# Patient Record
Sex: Female | Born: 1955 | Race: Black or African American | Hispanic: No | Marital: Single | Smoking: Never smoker
Health system: Southern US, Community
[De-identification: ages and names within clinical notes are randomized; demographics above are authoritative.]

## PROBLEM LIST (undated history)

## (undated) DIAGNOSIS — I639 Cerebral infarction, unspecified: Secondary | ICD-10-CM

## (undated) DIAGNOSIS — K219 Gastro-esophageal reflux disease without esophagitis: Secondary | ICD-10-CM

## (undated) DIAGNOSIS — K5900 Constipation, unspecified: Secondary | ICD-10-CM

## (undated) DIAGNOSIS — E049 Nontoxic goiter, unspecified: Secondary | ICD-10-CM

## (undated) DIAGNOSIS — J45909 Unspecified asthma, uncomplicated: Secondary | ICD-10-CM

## (undated) DIAGNOSIS — F419 Anxiety disorder, unspecified: Secondary | ICD-10-CM

## (undated) DIAGNOSIS — I10 Essential (primary) hypertension: Secondary | ICD-10-CM

## (undated) DIAGNOSIS — M1990 Unspecified osteoarthritis, unspecified site: Secondary | ICD-10-CM

## (undated) DIAGNOSIS — F329 Major depressive disorder, single episode, unspecified: Secondary | ICD-10-CM

## (undated) DIAGNOSIS — J189 Pneumonia, unspecified organism: Secondary | ICD-10-CM

## (undated) DIAGNOSIS — R32 Unspecified urinary incontinence: Secondary | ICD-10-CM

## (undated) DIAGNOSIS — F32A Depression, unspecified: Secondary | ICD-10-CM

## (undated) HISTORY — PX: ABDOMINAL HYSTERECTOMY: SHX81

## (undated) HISTORY — PX: COLONOSCOPY: SHX5424

## (undated) HISTORY — PX: JOINT REPLACEMENT: SHX530

## (undated) HISTORY — PX: TOTAL HIP ARTHROPLASTY: SHX124

## (undated) HISTORY — PX: TUBAL LIGATION: SHX77

## (undated) HISTORY — PX: THYROIDECTOMY, PARTIAL: SHX18

## (undated) HISTORY — PX: OTHER SURGICAL HISTORY: SHX169

## (undated) HISTORY — PX: DILATION AND CURETTAGE OF UTERUS: SHX78

## (undated) HISTORY — PX: TONSILLECTOMY: SUR1361

---

## 1998-06-28 ENCOUNTER — Encounter: Admission: RE | Admit: 1998-06-28 | Discharge: 1998-06-28 | Payer: Self-pay | Admitting: *Deleted

## 1999-11-01 ENCOUNTER — Emergency Department (HOSPITAL_COMMUNITY): Admission: EM | Admit: 1999-11-01 | Discharge: 1999-11-01 | Payer: Self-pay | Admitting: Emergency Medicine

## 1999-11-07 ENCOUNTER — Encounter: Admission: RE | Admit: 1999-11-07 | Discharge: 1999-11-07 | Payer: Self-pay | Admitting: Hematology and Oncology

## 1999-11-22 ENCOUNTER — Ambulatory Visit (HOSPITAL_COMMUNITY): Admission: RE | Admit: 1999-11-22 | Discharge: 1999-11-22 | Payer: Self-pay

## 1999-12-28 ENCOUNTER — Encounter: Admission: RE | Admit: 1999-12-28 | Discharge: 1999-12-28 | Payer: Self-pay | Admitting: Internal Medicine

## 2001-06-23 ENCOUNTER — Emergency Department (HOSPITAL_COMMUNITY): Admission: EM | Admit: 2001-06-23 | Discharge: 2001-06-23 | Payer: Self-pay

## 2001-08-03 ENCOUNTER — Encounter: Admission: RE | Admit: 2001-08-03 | Discharge: 2001-08-03 | Payer: Self-pay | Admitting: Internal Medicine

## 2001-08-07 ENCOUNTER — Ambulatory Visit (HOSPITAL_COMMUNITY): Admission: RE | Admit: 2001-08-07 | Discharge: 2001-08-07 | Payer: Self-pay | Admitting: Internal Medicine

## 2002-04-03 ENCOUNTER — Emergency Department (HOSPITAL_COMMUNITY): Admission: EM | Admit: 2002-04-03 | Discharge: 2002-04-03 | Payer: Self-pay | Admitting: Emergency Medicine

## 2003-09-20 ENCOUNTER — Emergency Department (HOSPITAL_COMMUNITY): Admission: EM | Admit: 2003-09-20 | Discharge: 2003-09-20 | Payer: Self-pay | Admitting: Emergency Medicine

## 2003-10-01 DIAGNOSIS — I639 Cerebral infarction, unspecified: Secondary | ICD-10-CM

## 2003-10-01 HISTORY — DX: Cerebral infarction, unspecified: I63.9

## 2004-04-06 ENCOUNTER — Encounter: Admission: RE | Admit: 2004-04-06 | Discharge: 2004-04-06 | Payer: Self-pay | Admitting: Internal Medicine

## 2004-07-25 ENCOUNTER — Inpatient Hospital Stay (HOSPITAL_COMMUNITY): Admission: EM | Admit: 2004-07-25 | Discharge: 2004-07-30 | Payer: Self-pay | Admitting: Emergency Medicine

## 2004-07-26 ENCOUNTER — Encounter (INDEPENDENT_AMBULATORY_CARE_PROVIDER_SITE_OTHER): Payer: Self-pay | Admitting: *Deleted

## 2004-09-27 ENCOUNTER — Ambulatory Visit (HOSPITAL_COMMUNITY): Admission: RE | Admit: 2004-09-27 | Discharge: 2004-09-27 | Payer: Self-pay | Admitting: Gastroenterology

## 2004-10-09 ENCOUNTER — Other Ambulatory Visit: Admission: RE | Admit: 2004-10-09 | Discharge: 2004-10-09 | Payer: Self-pay | Admitting: Obstetrics and Gynecology

## 2004-10-17 ENCOUNTER — Encounter: Admission: RE | Admit: 2004-10-17 | Discharge: 2004-10-17 | Payer: Self-pay | Admitting: Obstetrics and Gynecology

## 2006-10-05 ENCOUNTER — Emergency Department (HOSPITAL_COMMUNITY): Admission: EM | Admit: 2006-10-05 | Discharge: 2006-10-05 | Payer: Self-pay | Admitting: Emergency Medicine

## 2008-12-02 ENCOUNTER — Inpatient Hospital Stay (HOSPITAL_COMMUNITY): Admission: EM | Admit: 2008-12-02 | Discharge: 2008-12-04 | Payer: Self-pay | Admitting: Emergency Medicine

## 2008-12-16 ENCOUNTER — Encounter (INDEPENDENT_AMBULATORY_CARE_PROVIDER_SITE_OTHER): Payer: Self-pay | Admitting: Internal Medicine

## 2008-12-16 ENCOUNTER — Other Ambulatory Visit: Admission: RE | Admit: 2008-12-16 | Discharge: 2008-12-16 | Payer: Self-pay | Admitting: Obstetrics and Gynecology

## 2009-09-13 ENCOUNTER — Encounter: Admission: RE | Admit: 2009-09-13 | Discharge: 2009-09-13 | Payer: Self-pay | Admitting: Internal Medicine

## 2010-10-22 ENCOUNTER — Encounter: Payer: Self-pay | Admitting: Obstetrics and Gynecology

## 2011-01-10 LAB — URINALYSIS, ROUTINE W REFLEX MICROSCOPIC
Bilirubin Urine: NEGATIVE
Glucose, UA: NEGATIVE mg/dL
Ketones, ur: NEGATIVE mg/dL
Nitrite: POSITIVE — AB
Protein, ur: 30 mg/dL — AB
Specific Gravity, Urine: 1.012 (ref 1.005–1.030)
Urobilinogen, UA: 2 mg/dL — ABNORMAL HIGH (ref 0.0–1.0)
pH: 6 (ref 5.0–8.0)

## 2011-01-10 LAB — URINE MICROSCOPIC-ADD ON

## 2011-01-10 LAB — URINE CULTURE
Colony Count: NO GROWTH
Culture: NO GROWTH

## 2011-01-10 LAB — GLUCOSE, CAPILLARY: Glucose-Capillary: 148 mg/dL — ABNORMAL HIGH (ref 70–99)

## 2011-01-10 LAB — POCT I-STAT, CHEM 8
BUN: 22 mg/dL (ref 6–23)
Calcium, Ion: 1.06 mmol/L — ABNORMAL LOW (ref 1.12–1.32)
Chloride: 95 mEq/L — ABNORMAL LOW (ref 96–112)
Creatinine, Ser: 2 mg/dL — ABNORMAL HIGH (ref 0.4–1.2)
Glucose, Bld: 107 mg/dL — ABNORMAL HIGH (ref 70–99)
HCT: 37 % (ref 36.0–46.0)
Hemoglobin: 12.6 g/dL (ref 12.0–15.0)
Potassium: 2.4 mEq/L — CL (ref 3.5–5.1)
Sodium: 135 mEq/L (ref 135–145)
TCO2: 33 mmol/L (ref 0–100)

## 2011-01-10 LAB — CBC
HCT: 31.9 % — ABNORMAL LOW (ref 36.0–46.0)
HCT: 35.2 % — ABNORMAL LOW (ref 36.0–46.0)
Hemoglobin: 11.4 g/dL — ABNORMAL LOW (ref 12.0–15.0)
Hemoglobin: 12.7 g/dL (ref 12.0–15.0)
MCHC: 35.8 g/dL (ref 30.0–36.0)
MCHC: 36 g/dL (ref 30.0–36.0)
MCV: 87 fL (ref 78.0–100.0)
MCV: 87.8 fL (ref 78.0–100.0)
Platelets: 196 10*3/uL (ref 150–400)
Platelets: 237 10*3/uL (ref 150–400)
RBC: 3.63 MIL/uL — ABNORMAL LOW (ref 3.87–5.11)
RBC: 4.05 MIL/uL (ref 3.87–5.11)
RDW: 13.7 % (ref 11.5–15.5)
RDW: 14 % (ref 11.5–15.5)
WBC: 14.3 10*3/uL — ABNORMAL HIGH (ref 4.0–10.5)
WBC: 8.9 10*3/uL (ref 4.0–10.5)

## 2011-01-10 LAB — COMPREHENSIVE METABOLIC PANEL
ALT: 19 U/L (ref 0–35)
AST: 16 U/L (ref 0–37)
Albumin: 2.7 g/dL — ABNORMAL LOW (ref 3.5–5.2)
Alkaline Phosphatase: 66 U/L (ref 39–117)
BUN: 15 mg/dL (ref 6–23)
CO2: 29 mEq/L (ref 19–32)
Calcium: 7.7 mg/dL — ABNORMAL LOW (ref 8.4–10.5)
Chloride: 104 mEq/L (ref 96–112)
Creatinine, Ser: 1.14 mg/dL (ref 0.4–1.2)
GFR calc Af Amer: >60 mL/min (ref >60)
GFR calc non Af Amer: 50 mL/min — ABNORMAL LOW (ref >60)
Glucose, Bld: 116 mg/dL — ABNORMAL HIGH (ref 70–99)
Potassium: 2.8 mEq/L — ABNORMAL LOW (ref 3.5–5.1)
Sodium: 139 mEq/L (ref 135–145)
Total Bilirubin: 1.4 mg/dL — ABNORMAL HIGH (ref 0.3–1.2)
Total Protein: 5.9 g/dL — ABNORMAL LOW (ref 6.0–8.3)

## 2011-01-10 LAB — DIFFERENTIAL
Basophils Absolute: 0 10*3/uL (ref 0.0–0.1)
Basophils Absolute: 0 10*3/uL (ref 0.0–0.1)
Basophils Relative: 0 % (ref 0–1)
Basophils Relative: 0 % (ref 0–1)
Eosinophils Absolute: 0.1 10*3/uL (ref 0.0–0.7)
Eosinophils Absolute: 0.1 10*3/uL (ref 0.0–0.7)
Eosinophils Relative: 0 % (ref 0–5)
Eosinophils Relative: 1 % (ref 0–5)
Lymphocytes Relative: 13 % (ref 12–46)
Lymphocytes Relative: 17 % (ref 12–46)
Lymphs Abs: 1.5 10*3/uL (ref 0.7–4.0)
Lymphs Abs: 1.8 10*3/uL (ref 0.7–4.0)
Monocytes Absolute: 1.2 10*3/uL — ABNORMAL HIGH (ref 0.1–1.0)
Monocytes Absolute: 1.8 10*3/uL — ABNORMAL HIGH (ref 0.1–1.0)
Monocytes Relative: 13 % — ABNORMAL HIGH (ref 3–12)
Monocytes Relative: 14 % — ABNORMAL HIGH (ref 3–12)
Neutro Abs: 10.6 10*3/uL — ABNORMAL HIGH (ref 1.7–7.7)
Neutro Abs: 6 10*3/uL (ref 1.7–7.7)
Neutrophils Relative %: 68 % (ref 43–77)
Neutrophils Relative %: 74 % (ref 43–77)

## 2011-01-10 LAB — BASIC METABOLIC PANEL
Calcium: 8.6 mg/dL (ref 8.4–10.5)
GFR calc Af Amer: >60 mL/min (ref >60)
GFR calc non Af Amer: >60 mL/min (ref >60)
Glucose, Bld: 82 mg/dL (ref 70–99)
Sodium: 140 mEq/L (ref 135–145)

## 2011-01-10 LAB — POTASSIUM
Potassium: 3.7 mEq/L (ref 3.5–5.1)
Potassium: 3.9 mEq/L (ref 3.5–5.1)

## 2011-01-10 LAB — SODIUM, URINE, RANDOM: Sodium, Ur: 33 mEq/L

## 2011-01-10 LAB — MAGNESIUM: Magnesium: 1.7 mg/dL (ref 1.5–2.5)

## 2011-01-10 LAB — CREATININE, URINE, RANDOM: Creatinine, Urine: 33.4 mg/dL

## 2011-01-10 LAB — TSH: TSH: 0.154 u[IU]/mL — ABNORMAL LOW (ref 0.350–4.500)

## 2011-02-12 NOTE — H&P (Signed)
Bailey [name], Bailey NO.:  [id number]   MEDICAL RECORD NO.:  [id number]          PATIENT TYPE:  EMS   LOCATION:  MAJO                         FACILITY:  [facility]   PHYSICIAN:  Ramiro Harvest, MD    DATE OF BIRTH:  November 19, 1955   DATE OF ADMISSION:  12/01/2008  DATE OF DISCHARGE:                              HISTORY & PHYSICAL   The patient's primary care physician is Dr. Georgann Housekeeper of [facility].   HISTORY OF PRESENT ILLNESS:  Bailey Bailey [name] is a 55 year old African  American female with a history of a CVA with residual right lower  extremity a weakness, asthma, hypertension, depression, hypothyroidism,  headaches who presents to the ED from PCP office with a 4-day history of  dysuria, decreased urinary, generalized weakness, dizziness on standing,  chills, nausea, subjective fevers and diffuse body aches/discomfort.  The patient also endorses one episode of emesis 1 day prior to  admission. The patient stated that she had a viral gastroenteritis 7-10  days prior to admission which has since resolved.  The patient presented  to her PCP's office on December 01, 2008 secondary to inability to urinate  and generalized weakness and was then instructed to go to the ED. The  patient denies any chest pain or shortness of breath. No diarrhea or  constipation. No melena or hematemesis.  No hematochezia.  No focal  neurological symptoms.  No cough, no other associated symptoms. In the  ED high I-STAT-8 showed a potassium of 2.4, creatinine of 2.  CBC done  showed a white count of 14.3 and an ANC of 10.6, otherwise was within  normal limits.  UA was consistent with a urinary tract infection. We  were called to admit the patient for further evaluation and management.   ALLERGIES:  Codeine and sulfa.   PAST MEDICAL HISTORY:  1. Hypertension.  2. 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  3. Sciatica.  4. History of Bell's palsy on  the left in the past.  5. History of asthma.  6. History of depression.  7. Status post hysterectomy.  8. Status post thyroidectomy.  9. Hypothyroidism.  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  11.History of chronic daily headaches.  12.Bronchitis.  13.History of hypokalemia secondary to diuretics.  14.History of esophageal stricture status post dilation in the past.  15.Gastroesophageal reflux disease.  16.Sickle cell trait.   HOME MEDICATIONS:  The patient is unable to give the doses and as such  will need to verify home medications with the PCP. However, the patient  states she is on Toprol, potassium, Pulmicort, fish oil lisinopril,  Celexa 40 mg p.o. daily, triamterene HCTZ, Wellbutrin, and Lotrel as  well as clonidine.   SOCIAL HISTORY:  The patient is divorced.  No tobacco use.  Occasional  alcohol use.  No IV drug use.  The patient lives in [place] by  herself.   FAMILY HISTORY:  Noncontributory.   REVIEW OF SYSTEMS:  As per HPI, otherwise negative.   PHYSICAL EXAM:  VITAL SIGNS: Temperature 98.9, blood  pressure 104/72,  pulse of 70, respiratory rate 18, satting 99% on room air.  GENERAL:  Patient in no apparent distress.  HEENT: Normocephalic, atraumatic.  Pupils equal, round and reactive to  light and accommodation.  Extraocular movements intact.  Oropharynx is  clear.  No lesions, no exudates.  Mildly dry mucous membranes.  Neck is  supple.  No lymphadenopathy.  RESPIRATORY: Lungs are clear to auscultation bilaterally.  CARDIOVASCULAR:  Regular rate and rhythm.  No murmurs, rubs or gallops.  ABDOMEN: Soft, nondistended.  Positive bowel sounds.  Diffuse upper  abdominal discomfort to palpation.  EXTREMITIES:  No clubbing, cyanosis  or edema.  NEUROLOGICAL:  The patient is alert and oriented x3.  Cranial nerves II-  XII are grossly intact.  No focal deficits.   ADMISSION LABS:  1. I-STAT 8 eight sodium 135, potassium 2.4, chloride 95, glucose  107,      BUN 22, creatinine 2.  CBC white count 14.3, hemoglobin 12.7,      hematocrit 35.2, platelets of 237, ANC of 10.6.  Urinalysis is      amber, clear specific gravity 1.012, pH of 6, glucose negative,      bilirubin negative, ketones negative, blood trace, protein 30,      urobilinogen 2, nitrite positive, leukocytes trace. Urine      microscopy WBCs 3-6, RBCs 3-6, bacteria is rare.  EKG shows a      normal sinus rhythm.   ASSESSMENT AND PLAN:  Ms. Bailey Bailey [name] is a 55 year old female who  presented to the ED with a 4-day history of dysuria, decreased urinary  output, generalized weakness as well as dizziness.  1. Urinary tract infection.  Check urine cultures.  Place empirically      on IV Rocephin.  2. Dehydration.  Hydrate with IV fluids.  3. Hypokalemia, likely secondary to diuretics and emesis.  Check a      magnesium level and replete.  4. Hypothyroidism.  Check a TSH.  5. Depression.  Continue home dose Celexa.  6. Hypertension. Will hold BP meds.  7. Acute renal failure.  Baseline creatinine of 1.2 per records of      October 2005. This is likely secondary to a prerenal azotemia      secondary to dehydration.  We will check a fractional excretion of      sodium.  Will hydrate gently with IV fluids.  Hold the patient's      lisinopril as well as antihypertensive medications for now and      follow. If no improvement in renal function, consider renal      ultrasound.  8. Asthma, stable, albuterol as needed.  9. Leukocytosis likely secondary to urinary tract infection.  Check a      urine culture.  Check a chest x-[place]. Place on empiric IV Rocephin      and follow.  10.Headache, stable.  11.Sciatica.  12.Gastroesophageal reflux disease.  Protonix.  13.Hypertension.  Hold BP meds.  14.Prophylaxis. Protonix for GI prophylaxis.  Lovenox for DVT      prophylaxis.   It has been a pleasure taking care of Ms. Bailey Bailey [name].      Ramiro Harvest, MD  Electronically  Signed     DT/MEDQ  D:  12/02/2008  T:  12/02/2008  Job:  [id number]   cc:   Georgann Housekeeper, MD

## 2011-02-12 NOTE — Discharge Summary (Signed)
NAMEDONDI, Bailey [name]              ACCOUNT NO.:  [id number]   MEDICAL RECORD NO.:  [id number]          PATIENT TYPE:  INP   LOCATION:  5506                         FACILITY:  [facility]   PHYSICIAN:  Georgann Housekeeper, MD      DATE OF BIRTH:  1956/04/24   DATE OF ADMISSION:  12/01/2008  DATE OF DISCHARGE:  12/04/2008                               DISCHARGE SUMMARY   DISCHARGE DIAGNOSES:  1. Left early pyelonephritis versus urinary tract infection.  2. Dehydration.  3. Hypokalemia.  4. Hypertension.  5. History of depression.  6. History of cerebrovascular accident.  7. History of chronic headaches.   MEDICATIONS ON DISCHARGE:  1. Toprol 100 mg daily.  2. Doxazosin 2 mg once a day.  3. Maxzide 75 mg half a tablet daily.  4. Celexa 40 mg daily.  5. Wellbutrin 100 mg b.i.d.  6. Potassium 20 mEq b.i.d.  7. Aspirin 81 mg daily.  8. Lisinopril 40 mg daily.  9. Clonidine 0.2 mg at bedtime.  10.Cipro 500 mg b.i.d. for 10 days.  11.Vicodin 1-2 per day p.r.n.   HOSPITAL LABORATORY DATA:  Potassium on admission 2.8, potassium at the  time of discharge 3.8.  White count of 14.3 on admission and discharge  8.9, hemoglobin 11.4.  Creatinine 0.7.  Urine culture negative.  CT of  the abdomen and pelvic showed mild thickening of the left ureter with no  evidence of any abscess, no kidney stones, possibly pyelonephritis  versus UTI.   HOSPITAL COURSE:  1. This is a 55 year old female admitted with UTI, abdominal pain,      dehydration, admitted with IV antibiotic Rocephin.  CT scan of the      abdomen and pelvic obtained showed no evidence of any abscess or      kidney stone, showed questionable early pyelo on the left ureter.      The patient is to continue IV antibiotic.  Her white count came      back normal.  She had low-grade temperature, spiked to 101, and      then came down to normal.  She will switch to p.o. Cipro for 10      days.  She was continued on IV fluids throughout.  Blood  pressure      remained stable.  2. Hypertension.  Her blood pressure medication was on hold.  She was      restarted back on her blood pressure medication, which we will      continue to do and decrease her Maxzide to half a tablet because of      her hypokalemia.  3. Hypokalemia.  Because of dehydration as well as medication effects,      potassium replaced, discharge potassium was 3.8.  She will continue      on potassium supplement at home and Maxzide half a tablet daily.  4. History of depression.  Continue on Celexa and Wellbutrin.  5. History of chronic headaches.  Vicodin p.r.n.   Follow up in 1 week with me at the office.      Georgann Housekeeper, MD  Electronically  Signed     KH/MEDQ  D:  12/04/2008  T:  12/04/2008  Job:  [id number]

## 2011-02-15 NOTE — Op Note (Signed)
NAMEJULAINE, Bailey [name]              ACCOUNT NO.:  [id number]   MEDICAL RECORD NO.:  [id number]          PATIENT TYPE:  AMB   LOCATION:  ENDO                         [facility]ACILITY:  [facility]   PHYSICIAN:  Danise Edge, M.D.   DATE OF BIRTH:  1956/01/25   DATE OF PROCEDURE:  09/27/2004  DATE OF DISCHARGE:                                 OPERATIVE REPORT   PROCEDURE:  Esophagogastroduodenoscopy.   INDICATIONS FOR PROCEDURE:  Ms. Bailey [name] is a 55 year old female born  1956/03/04.  Ten years ago Ms. Bailey [name] underwent esophageal stricture  dilation.  She is now experiencing intermittent choking and possible  dysphagia.   ENDOSCOPIST:  Danise Edge, M.D.   PREMEDICATION:  Versed 4 mg and Demerol 50 mg.   DESCRIPTION OF PROCEDURE:  After obtaining informed consent, Ms. Bailey [name] was  placed in the left lateral decubitus position.  I administered intravenous  Demerol and intravenous Versed to achieve conscious sedation for the  procedure.  The patient's blood pressure, oxygen saturation and cardiac  rhythm were monitored throughout the procedure and documented in the medical  record.   The Olympus gastroscope was passed through the posterior hypopharynx into  the proximal esophagus without difficulty.  The hypopharynx, larynx and  vocal cords appeared normal.   Esophagoscopy:  The proximal, mid and lower segments of the esophageal  mucosa appear normal.  The squamocolumnar junction and esophagogastric  junction are noted at 40 cm from the incisor teeth.  There was no endoscopic  evidence for esophageal stricture formation, esophageal obstruction or  Barrett's esophagus.   Gastroscopy:  Retroflexed view of the gastric cardia and fundus was normal.  The gastric body, antrum and pyloris appeared normal.   Duodenoscopy:  The duodenal bulb and descending duodenum appear normal.   ASSESSMENT:  Normal esophagogastroduodenoscopy.      MJ/MEDQ  D:  09/27/2004  T:  09/27/2004  Job:  [id number]   cc:   Georgann Housekeeper, MD  [address]  [place]  [place] [address]  Fax: [phone]

## 2011-02-15 NOTE — Discharge Summary (Signed)
NAMECAITLAIN, TWEED              ACCOUNT NO.:  [id number]   MEDICAL RECORD NO.:  [id number]          PATIENT TYPE:  INP   LOCATION:  3041                         FACILITY:  [facility]   PHYSICIAN:  Georgann Housekeeper, MD      DATE OF BIRTH:  1955-11-06   DATE OF ADMISSION:  07/25/2004  DATE OF DISCHARGE:  07/30/2004                                 DISCHARGE SUMMARY   DISCHARGE DIAGNOSES:  1.  Left cerebrovascular accident in the left central [place] and left      frontal and left parietal deep matter with chronic remote lacunar      infarct, right-sided weakness, left arm and leg, leg greater than the      arm.  2.  Hypertension.  3.  Asthma.  4.  Bronchitis.  5.  Chronic headaches.  6.  Depression.  7.  Hypokalemia secondary to the diuretics.   CONSULTATIONS:  1.  Neurology.  2.  Physical therapy.   PROCEDURE:  CT scan of the head initially showed negative.  MRA and MRI of  the head, neck, and brain showed superimposed subacute infarction of the  left centrum [place] and left frontal parietal areas with remote lacunar  infarcts.  MRA of the neck and brain; no significant stenosis.  Carotid  ultrasound was negative.  Echocardiogram was normal without any thrombosis,  normal LV function.   Chest x-Scholle was normal.  EKG unremarkable.   DISCHARGE MEDICATIONS:  1.  Lotrel 10/20 one per day.  2.  Maxzide 75/50 one a day.  3.  Potassium 40 mEq b.i.d.  4.  Pulmicort two puffs daily.  5.  Celexa 10 mg daily.  6.  Clonidine 0.2 mg q.h.s.  7.  Lotensin 20 mg q.p.m.  8.  Ecotrin 325 mg daily.  9.  Darvocet-N 100 q.6h p.r.n.  10. Albuterol MDI p.r.n.  11. Plavix 75 mg daily.   PT and home health nursing.   DIET:  Low salt.   FOLLOW UP:  Follow up in 10 days with me.   HOSPITAL COURSE:  Please see H&P for details.  A 55 year old female with  history of hypertension, asthma, depression, and remote lacunar infarcts who  came in for acute right-sided weakness for two days and change  in mental  status and disorientation.   Problem 1.  Neurology.  The patient had CT scan initially which did not show  any bleed.  She was started on aspirin and Plavix.  The patient had a  neurology consult.  MRA was obtained which showed an acute infarction in the  left frontal parietal white matter consistent with a left CVA with right-  sided weakness.  The patient had improvement in her upper arm strength over  the next couple of days as well as the leg.  She worked with physical  therapy.  Rehab was consulted and did not think she needed to have acute  rehab in the hospital, but she needed physical therapy.  Her blood pressure  was elevated.  The workup for the stroke was done which included ultrasound  of the carotids, echo, MRA  of the brain and neck which were all  unremarkable.   Problem 2.  Hypertension.  The patient was on Lotrel, Maxzide, and  Clonidine.  Blood pressure was elevated and started on Lotensin along with  other medications.  Much improved blood pressure.  Her potassium remained  low which was supplemented and was thought to be secondary to her diuretic.  Her electrolytes remained stable, blood counts remained stable.  She has a  history of asthma with some cough and a chest x-Schuman was negative and showed  some bronchitic picture and was started on Azithromycin.  Improvement with  the antibiotics.  Remained afebrile.  White count remained normal.  She had  a history of chronic headaches and continued on Darvocet and also took some  Ultram in the hospital and that also improved.  For her depression, she was  started on Celexa 10 mg daily and tolerated that well.   FOLLOW UP:  Follow up in 10 days at the office.  The patient will be going  to her mother's home and with physical therapy, OT, and PT.      Karr   KH/MEDQ  D:  07/30/2004  T:  07/30/2004  Job:  [id number]

## 2011-02-15 NOTE — Consult Note (Signed)
NAMEDONIESHA, Bailey [name]              ACCOUNT NO.:  [id number]   MEDICAL RECORD NO.:  [id number]          PATIENT TYPE:  INP   LOCATION:  3041                         FACILITY:  [facility]   PHYSICIAN:  Marlan Palau, M.D.  DATE OF BIRTH:  [birth year]   DATE OF CONSULTATION:  07/25/2004  DATE OF DISCHARGE:                                   CONSULTATION   This patient is a 55 year old right-handed black female born 13-Aug-1956  with a history of hypertension and cerebrovascular disease in the past.  This patient presents to [facility] on July 25, 2004 with onset  of right-sided weakness that was notable on the day of admission.  Patient  had claimed she woke up with the deficits and that the deficits had  persisted unchanged throughout the day.  Patient has headache, but claims  she has had daily headaches for years.  Patient notes some numbness on the  arm and leg on the right, not much on the face.  Patient has been able to  ambulate with assistance.  Patient has not had any visual field changes,  speech changes, swallowing problems.  Neurology was asked to see this  patient for further evaluation.  A CT scan of the brain done through the  emergency room shows primarily chronic small vessel ischemic changes in the  left greater than right hemisphere.  There is subcortical consistent with  lacunar infarcts.  Patient has not been on aspirin on a regular basis prior  to this admission.   Patient does have a history of right-sided sciatica that has persisted  unchanged.   PAST MEDICAL HISTORY:  1.  History of hypertension.  2.  New onset right hemiparesis as above.  3.  Sciatica.  4.  History of Bell's palsy on the left in the past.  5.  History of asthma.  6.  History of depression.  7.  Hysterectomy in the past.  8.  Thyroidectomy, hypothyroidism.  9.  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  10. History of chronic daily headaches.   MEDICATIONS PRIOR TO  ADMISSION:  1.  Lotrel 10/20 one daily.  2.  Clonidine 0.2 mg q.h.s.  3.  Maxzide 25/50 one daily.  4.  Albuterol p.r.n.  5.  Lorazepam 0.5 mg p.r.n.  6.  Pulmicort inhaler p.r.n.   HABITS:  Patient does not smoke.  Drinks alcohol on occasion.   ALLERGIES:  SULFA DRUGS, CODEINE.   SOCIAL HISTORY:  This patient is divorced.  Lives with a grandson, a mother.  Has one son.  Patient works as a IT sales professional.   FAMILY HISTORY:  Notable for stroke, heart disease, and hypertension.  Patient has a son and a daughter that apparently had sickle cell disease and  passed away.  Patient, herself, has sickle trait.   REVIEW OF SYSTEMS:  Notable for no recent fevers, chills.  Patient does note  chronic daily headaches.  Denies any visual field changes.  Denies any  problems with speech, swallowing.  Patient has had, recently, some shortness  of breath, cough, chest pains.  Does note some shortness of breath.  Denies  any abdominal pains, per se.  Has had some nausea.  Does note some trouble  controlling the bowels and the bladder off and on.  Patient claims that this  has been persistent for some time.  Patient has had difficulty walking since  onset of the right-sided weakness.  Patient notes some dizziness at times.   PHYSICAL EXAMINATION:  VITAL SIGNS:  Blood pressure 153/105, heart rate 72,  respiratory rate 24, temperature afebrile.  GENERAL:  This patient is a fairly well-developed black female who is alert,  cooperative at the time of examination.  HEENT:  Head is atraumatic.  Eyes:  Pupils are equal, round, and reactive to  light.  Disks are flat bilaterally.  NECK:  Supple.  No carotid bruits noted.  RESPIRATORY:  Clear.  There may be some occasional rales at the bases  bilaterally.  CARDIOVASCULAR:  Regular rate and rhythm with grade 1/6 systolic ejection  murmur in the aortic area noted.  EXTREMITIES:  Without significant edema.  NEUROLOGIC:  Cranial nerves as above.  Facial  symmetry is present.  Patient  notes some decreased pin prick sensation on the right lower face compared to  the left, symmetric in the forehead.  The patient has full extraocular  movements.  Visual fields are full.  Speech is well enunciated, not aphasic.  Patient has good strength of facial muscles and the muscles to head  turning/shoulder shrug bilaterally.  Protrudes the tongue in midline.  Has  good tongue strength, jaw strength.  Motor testing reveals good distal  strength in the right arm, some 4/5 strength proximally in the right arm,  normal strength on the left arm and leg.  Left leg is 4-/5 strength proximal  and distally.  The patient has decreased pin prick sensation on the right  arm and leg as compared to the left.  Vibratory sensation is symmetric  throughout.  Patient is able to perform finger-nose-finger with some ataxia  on the right, normal on the left.  Cannot perform toe-to-finger on the right  leg.  Can perform with the left.  Patient was ambulated with somewhat wide-  based gait, difficulty bearing weight on the right leg.  Deep tendon  reflexes are relatively symmetric.  Toes are neutral bilaterally.  There may  be a relative depression of the ankle jerk reflex on the right as compared  to the left.   LABORATORY DATA:  CK 48, MB fraction 1.1.  Sodium 141, potassium 3.2,  chloride 101, CO2 33, glucose 94, BUN 10, creatinine 1.2.  Calcium 8.8,  total protein 6.6, albumin 3.4, AST 16, ALT 17, alkaline phosphatase 64,  total bilirubin 1.0.  Troponin I 0.01.  INR 0.9.  CBC:  4.1, hemoglobin  13.1, hematocrit 37.8, MCV 85.6, platelets 301.   EKG reveals heart rate of 59, sinus bradycardia noted, T-wave abnormalities,  consider lateral ischemia.  CT of the head is as above.   IMPRESSION:  1.  New onset right hemiparesis.  2.  Hypertension.  3.  Right leg sciatica.  This patient has had new onset of right-sided weakness.  Patient has by CT  of the head evidence of  old lacunar type cerebrovascular infarctions.  If  anything, the right leg is more effected than the right arm simulating to  some degree an anterior cerebral distribution ischemic  event. Will pursue  further work-up at this time.   PLAN:  1.  MRI of the brain.  2.  MR angiogram of the intracranial/extracranial vessel.  3.  2-D echocardiogram.  4.  Patient is on Plavix at this time, but probably could be discharged on      aspirin if no cardiogenic source of the stroke is noted.  Patient will      need physical and occupational therapy.  Will follow patient's clinical      course while in-house.  Will check a fasting lipid profile and repeat a      homocysteine level.       CKW/MEDQ  D:  07/25/2004  T:  07/25/2004  Job:  [id number]

## 2011-02-15 NOTE — H&P (Signed)
NAMEJACALYN, BIGGS              ACCOUNT NO.:  [id number]   MEDICAL RECORD NO.:  [id number]          PATIENT TYPE:  INP   LOCATION:  1825                         FACILITY:  [facility]   PHYSICIAN:  Georgann Housekeeper, MD      DATE OF BIRTH:  January 22, 1956   DATE OF ADMISSION:  07/25/2004  DATE OF DISCHARGE:                                HISTORY & PHYSICAL   CHIEF COMPLAINT:  Right-sided weakness and disorientation, also some  shortness of breath and chest congestion.   HISTORY OF PRESENT ILLNESS:  A 55 year old African-American female who has  been noncompliant with her medication because of for more financial reasons.  Has history of hypertension, asthma, and depression, and comes in along with  her mom.  She has been noticing that she has had complaint of some chest  congestion and cough for three or four days, was called in some Z-Pak, which  she said she could not afford the medication, has not taken it.  She is only  taking albuterol for her asthma and feels a little wheezing.  The last two  days she said her right side feels much weaker and she cannot feel very  heavy, cannot much have strength, and also the mother said she has been a  little disoriented and sleepy at times.  The patient complained of some  headache on and off and has felt weak, discomfort in the back as well as in  the lower leg.  She had also complained of some shortness of breath at times  and some wheezing and some cough and sinus congestion.  In the clinic she  had right-sided weakness and paresis and could not move her upper  extremities well as well as the right lower extremity and some drooling  noted.   PAST MEDICAL HISTORY:  1.  History of hypertension for the last six years.  2.  Asthma, has seen Dr. Lacretia Nicks in the past.  3.  Acid reflux disease.  4.  Sickle cell trait.  5.  Depression.  6.  Had MRI done for ataxia in July, showed old strokes and some chronic      hypertensive changes.   ALLERGIES:   SULFA and CODEINE.   CURRENT MEDICATIONS:  1.  Lotrel 10/20 mg one a day.  2.  Clonidine 0.2 mg q.h.s.  3.  Maxzide 25/50 mg one a day.  4.  Albuterol MDI p.r.n.  5.  Lorazepam 0.5 mg p.r.n.  6.  Pulmicort inhaler p.r.n.   PAST SURGICAL HISTORY:  1.  Status post thyroid goiter surgery.  2.  Status post hysterectomy for cervical cancer in situ.   SOCIAL HISTORY:  No tobacco, occasional alcohol.  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   FAMILY HISTORY:  Her parents are living.  Father is 49, mother is 44.  Two  brothers and sister.  She has one son and a daughter deceased of sickle cell  disease.   PHYSICAL EXAMINATION:  VITAL SIGNS:  Temperature 98.2, saturations were 98%  on room air, pulse 68, blood pressure 168/108.  GENERAL:  She was in a wheelchair with right-sided weakness and some  drooling.  CHEST:  Lungs were clear.  CARDIAC:  Regular S1, S2 without murmurs.  NECK:  Supple.  ABDOMEN:  Soft.  NEUROLOGIC:  She had some weakness on the right side.  She has 3/5 on the  right upper extremity.  Lower extremities:  She had some dragging of the  feet with 3/5.  The left side was 5/5.  As far as some facial droop noted.  She was awake and alert, oriented x3.   LABORATORY DATA:  Laboratory will be done in the emergency room.  Will get a  CT of the head, blood work including blood chemistries, cardiac markers,  CBC, and EKG.   IMPRESSION:  55 years old with a history of hypertension, asthma.   1.  Right-sided weakness, rule out acute stroke.  2.  Hypertension.  3.  History of asthma, some chest congestion and probable bronchitis.      Saturations are okay.   PLAN:  1.  I will admit to telemetry.  2.  Get a neurology consult.  Get a CT scan of the head and the blood work.  3.  As far as the blood pressure, continue on Lotrel and Maxzide.  4.  For history of asthma and some bronchitis, I will put her on Rocephin       and albuterol nebulizers as well as Pulmicort.  No need for any steroids      at this point.      Karr   KH/MEDQ  D:  07/25/2004  T:  07/25/2004  Job:  [id number]

## 2011-10-01 HISTORY — PX: JOINT REPLACEMENT: SHX530

## 2011-10-03 ENCOUNTER — Other Ambulatory Visit: Payer: Self-pay | Admitting: Orthopedic Surgery

## 2011-10-08 ENCOUNTER — Encounter (HOSPITAL_COMMUNITY): Payer: Self-pay | Admitting: Pharmacy Technician

## 2011-10-14 ENCOUNTER — Encounter (HOSPITAL_COMMUNITY)
Admission: RE | Admit: 2011-10-14 | Discharge: 2011-10-14 | Disposition: A | Discharge status: Home / Self Care | Payer: Medicare Other | Source: Ambulatory Visit | Attending: Orthopedic Surgery | Admitting: Orthopedic Surgery

## 2011-10-14 ENCOUNTER — Ambulatory Visit (HOSPITAL_COMMUNITY): Admission: RE | Admit: 2011-10-14 | Payer: Medicare Other | Source: Ambulatory Visit

## 2011-10-14 ENCOUNTER — Other Ambulatory Visit (HOSPITAL_COMMUNITY): Payer: Self-pay | Admitting: Orthopedic Surgery

## 2011-10-14 ENCOUNTER — Encounter (HOSPITAL_COMMUNITY): Payer: Self-pay

## 2011-10-14 ENCOUNTER — Other Ambulatory Visit: Payer: Self-pay

## 2011-10-14 DIAGNOSIS — M1711 Unilateral primary osteoarthritis, right knee: Secondary | ICD-10-CM

## 2011-10-14 HISTORY — DX: Anxiety disorder, unspecified: F41.9

## 2011-10-14 HISTORY — DX: Unspecified osteoarthritis, unspecified site: M19.90

## 2011-10-14 HISTORY — DX: Gastro-esophageal reflux disease without esophagitis: K21.9

## 2011-10-14 HISTORY — DX: Unspecified urinary incontinence: R32

## 2011-10-14 HISTORY — DX: Essential (primary) hypertension: I10

## 2011-10-14 HISTORY — DX: Cerebral infarction, unspecified: I63.9

## 2011-10-14 HISTORY — DX: Pneumonia, unspecified organism: J18.9

## 2011-10-14 HISTORY — DX: Major depressive disorder, single episode, unspecified: F32.9

## 2011-10-14 HISTORY — DX: Depression, unspecified: F32.A

## 2011-10-14 LAB — SURGICAL PCR SCREEN: MRSA, PCR: NEGATIVE

## 2011-10-14 LAB — CBC
HCT: 35.9 % — ABNORMAL LOW (ref 36.0–46.0)
MCHC: 36.5 g/dL — ABNORMAL HIGH (ref 30.0–36.0)
Platelets: 247 10*3/uL (ref 150–400)
RDW: 13.3 % (ref 11.5–15.5)
WBC: 3 10*3/uL — ABNORMAL LOW (ref 4.0–10.5)

## 2011-10-14 LAB — URINALYSIS, ROUTINE W REFLEX MICROSCOPIC
Ketones, ur: NEGATIVE mg/dL
Leukocytes, UA: NEGATIVE
Nitrite: NEGATIVE
Protein, ur: NEGATIVE mg/dL
Urobilinogen, UA: 2 mg/dL — ABNORMAL HIGH (ref 0.0–1.0)

## 2011-10-14 LAB — DIFFERENTIAL
Basophils Absolute: 0 10*3/uL (ref 0.0–0.1)
Basophils Relative: 1 % (ref 0–1)
Lymphocytes Relative: 40 % (ref 12–46)
Monocytes Absolute: 0.4 10*3/uL (ref 0.1–1.0)
Neutro Abs: 1.3 10*3/uL — ABNORMAL LOW (ref 1.7–7.7)
Neutrophils Relative %: 43 % (ref 43–77)

## 2011-10-14 LAB — BASIC METABOLIC PANEL
Chloride: 101 mEq/L (ref 96–112)
Creatinine, Ser: 0.94 mg/dL (ref 0.50–1.10)
GFR calc Af Amer: 77 mL/min — ABNORMAL LOW (ref >90)
GFR calc non Af Amer: 66 mL/min — ABNORMAL LOW (ref >90)

## 2011-10-14 LAB — PROTIME-INR
INR: 0.99 (ref 0.00–1.49)
Prothrombin Time: 13.3 seconds (ref 11.6–15.2)

## 2011-10-14 LAB — APTT: aPTT: 29 seconds (ref 24–37)

## 2011-10-14 MED ORDER — CHLORHEXIDINE GLUCONATE 4 % EX LIQD: 60.0000 mL | Freq: Once | CUTANEOUS | Status: DC

## 2011-10-14 NOTE — Pre-Procedure Instructions (Signed)
[name]  10/14/2011   Your procedure is scheduled on:  10/21/2011  Report to [facility] at 10:30 AM.  Call this number if you have problems the morning of surgery: [phone]   Remember:   Do not eat food:After Midnight.  May have clear liquids: up to 4 Hours before arrival.  Clear liquids include soda, tea, black coffee, apple or grape juice, broth.  Take these medicines the morning of surgery with A SIP OF WATER: wellbutrin,celexa, clonidine, metoprolol,prilosec   Do not wear jewelry, make-up or nail polish.  Do not wear lotions, powders, or perfumes. You may wear deodorant.  Do not shave 48 hours prior to surgery.  Do not bring valuables to the hospital.  Contacts, dentures or bridgework may not be worn into surgery.  Leave suitcase in the car. After surgery it may be brought to your room.  For patients admitted to the hospital, checkout time is 11:00 AM the day of discharge.   Patients discharged the day of surgery will not be allowed to drive home.  Name and phone number of your driver: /w Mom  Special Instructions: CHG Shower Use Special Wash: 1/2 bottle night before surgery and 1/2 bottle morning of surgery.   Please read over the following fact sheets that you were given: Pain Booklet, Coughing and Deep Breathing, Blood Transfusion Information, MRSA Information and Surgical Site Infection Prevention

## 2011-10-17 NOTE — H&P (Signed)
HISTORY OF PRESENT ILLNESS:  Bailey [name] is a 56 year old patient who comes in today, complaining of continued right knee pain. She ambulates with a cane, and uses meloxicam and Vicodin for pain control.  She completed the Supart viscosupplementation series with minimal relief.  At this point her knee pain is interfering with her daily activities and waking her from sleep at night.    PAST MEDICAL HISTORY:  Significant for a stroke in 2005.  Since then she has had balance problems and headaches.  She also has hypertension, asthma, anxiety, and depression.  She has not listed her drug allergies or daily medications.  PAST SURGICAL HISTORY:  Significant for a hysterectomy.  FAMILY HISTORY:  Positive for hypertension.  SOCIAL HISTORY:  She denies the use of tobacco and drinks occasional alcohol.  She is divorced.  REVIEW OF SYSTEMS:  Review of systems reviewed thoroughly and all other systems are negative aside from her musculoskeletal complaints as related to the chief complaint. Patient denies dizziness, nausea, fever, chills, vomiting, shortness of breath, chest pain, loss of appetite, or rash.    OBJECTIVE: Well-developed, well-nourished.  Awake, alert, and oriented x3.  Extraocular motion is intact.  No use of accessory respiratory muscles for breathing.   Cardiovascular exam reveals a regular rhythm.  Skin is intact without cuts, scrapes, or abrasions. The right knee continues to demonstrate about a 12-15 degree valgus deformity and a 10 degree flexion contracture.  There is no effusion, but she is quite knock-knee when she walks.  RADIOGRAPHS: X-rays were ordered, performed, and interpreted by me today.   Four views of the knees demonstrate end-stage arthritis in the lateral compartment of the right greater than left knee.    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  I will see  her back at the time of surgical intervention.  We are going to get her set up for right total knee arthroplasty and DePuy Sigma RP components.  I do not believe we will need to use a MBT tray, but it is a far valgus knee and we will have that ready.  I will see her back at the time of surgical intervention.

## 2011-10-20 MED ORDER — CEFAZOLIN SODIUM 1-5 GM-% IV SOLN: 1.0000 g | INTRAVENOUS | Status: DC

## 2011-10-20 MED ORDER — CEFAZOLIN SODIUM-DEXTROSE 2-3 GM-% IV SOLR
2.0000 g | INTRAVENOUS | Status: AC
  Administered 2011-10-21: 2 g via INTRAVENOUS
  Filled 2011-10-20: qty 50

## 2011-10-21 ENCOUNTER — Ambulatory Visit (HOSPITAL_COMMUNITY): Payer: Medicare Other | Admitting: Certified Registered Nurse Anesthetist

## 2011-10-21 ENCOUNTER — Encounter (HOSPITAL_COMMUNITY)
Admission: RE | Disposition: A | Discharge status: Skilled Nursing Facility | Payer: Self-pay | Source: Ambulatory Visit | Attending: Orthopedic Surgery

## 2011-10-21 ENCOUNTER — Encounter (HOSPITAL_COMMUNITY): Payer: Self-pay | Admitting: Certified Registered Nurse Anesthetist

## 2011-10-21 ENCOUNTER — Inpatient Hospital Stay (HOSPITAL_COMMUNITY)
Admission: RE | Admit: 2011-10-21 | Discharge: 2011-10-24 | DRG: 470 | Disposition: A | Discharge status: Skilled Nursing Facility | Payer: Medicare Other | Source: Ambulatory Visit | Attending: Orthopedic Surgery | Admitting: Orthopedic Surgery

## 2011-10-21 DIAGNOSIS — J45909 Unspecified asthma, uncomplicated: Secondary | ICD-10-CM | POA: Diagnosis present

## 2011-10-21 DIAGNOSIS — M1711 Unilateral primary osteoarthritis, right knee: Secondary | ICD-10-CM | POA: Diagnosis present

## 2011-10-21 DIAGNOSIS — F329 Major depressive disorder, single episode, unspecified: Secondary | ICD-10-CM | POA: Diagnosis present

## 2011-10-21 DIAGNOSIS — Z886 Allergy status to analgesic agent status: Secondary | ICD-10-CM

## 2011-10-21 DIAGNOSIS — I10 Essential (primary) hypertension: Secondary | ICD-10-CM | POA: Diagnosis present

## 2011-10-21 DIAGNOSIS — N318 Other neuromuscular dysfunction of bladder: Secondary | ICD-10-CM | POA: Diagnosis present

## 2011-10-21 DIAGNOSIS — M1710 Unilateral primary osteoarthritis, unspecified knee: Principal | ICD-10-CM | POA: Diagnosis present

## 2011-10-21 DIAGNOSIS — Z882 Allergy status to sulfonamides status: Secondary | ICD-10-CM

## 2011-10-21 DIAGNOSIS — F3289 Other specified depressive episodes: Secondary | ICD-10-CM | POA: Diagnosis present

## 2011-10-21 DIAGNOSIS — M21869 Other specified acquired deformities of unspecified lower leg: Secondary | ICD-10-CM | POA: Diagnosis present

## 2011-10-21 DIAGNOSIS — Z8701 Personal history of pneumonia (recurrent): Secondary | ICD-10-CM

## 2011-10-21 DIAGNOSIS — Z8673 Personal history of transient ischemic attack (TIA), and cerebral infarction without residual deficits: Secondary | ICD-10-CM

## 2011-10-21 DIAGNOSIS — F411 Generalized anxiety disorder: Secondary | ICD-10-CM | POA: Diagnosis present

## 2011-10-21 DIAGNOSIS — K219 Gastro-esophageal reflux disease without esophagitis: Secondary | ICD-10-CM | POA: Diagnosis present

## 2011-10-21 DIAGNOSIS — Z90710 Acquired absence of both cervix and uterus: Secondary | ICD-10-CM

## 2011-10-21 HISTORY — PX: TOTAL KNEE ARTHROPLASTY: SHX125

## 2011-10-21 LAB — TYPE AND SCREEN
ABO/RH(D): A POS
Antibody Screen: NEGATIVE

## 2011-10-21 LAB — ABO/RH: ABO/RH(D): A POS

## 2011-10-21 SURGERY — ARTHROPLASTY, KNEE, TOTAL
Anesthesia: Regional | Site: Knee | Laterality: Right | Wound class: Clean

## 2011-10-21 MED ORDER — CEFUROXIME SODIUM 1.5 G IJ SOLR
INTRAMUSCULAR | Status: DC | PRN
  Administered 2011-10-21: 1.5 g

## 2011-10-21 MED ORDER — POTASSIUM CHLORIDE CRYS ER 20 MEQ PO TBCR
20.0000 meq | EXTENDED_RELEASE_TABLET | Freq: Two times a day (BID) | ORAL | Status: DC
  Administered 2011-10-21 – 2011-10-24 (×6): 20 meq via ORAL
  Filled 2011-10-21 (×8): qty 1

## 2011-10-21 MED ORDER — FENTANYL CITRATE 0.05 MG/ML IJ SOLN
INTRAMUSCULAR | Status: DC | PRN
  Administered 2011-10-21 (×3): 50 ug via INTRAVENOUS
  Administered 2011-10-21: 100 ug via INTRAVENOUS
  Administered 2011-10-21: 25 ug via INTRAVENOUS
  Administered 2011-10-21 (×4): 50 ug via INTRAVENOUS
  Administered 2011-10-21: 25 ug via INTRAVENOUS

## 2011-10-21 MED ORDER — ONDANSETRON HCL 4 MG/2ML IJ SOLN: 4.0000 mg | Freq: Four times a day (QID) | INTRAMUSCULAR | Status: DC | PRN

## 2011-10-21 MED ORDER — TRIAMTERENE-HCTZ 37.5-25 MG PO TABS
1.0000 | ORAL_TABLET | ORAL | Status: DC
  Administered 2011-10-23 – 2011-10-24 (×2): 1 via ORAL
  Filled 2011-10-21 (×4): qty 1

## 2011-10-21 MED ORDER — DEXTROSE-NACL 5-0.45 % IV SOLN: INTRAVENOUS | Status: DC

## 2011-10-21 MED ORDER — OXYCODONE-ACETAMINOPHEN 5-325 MG PO TABS
1.0000 | ORAL_TABLET | ORAL | Status: DC | PRN
  Administered 2011-10-22 – 2011-10-23 (×3): 2 via ORAL
  Administered 2011-10-23: 1 via ORAL
  Administered 2011-10-23 – 2011-10-24 (×3): 2 via ORAL
  Filled 2011-10-21 (×8): qty 2

## 2011-10-21 MED ORDER — VITAMIN D3 50 MCG (2000 UT) PO CAPS: 2000.0000 [IU] | ORAL_CAPSULE | Freq: Every day | ORAL | Status: DC

## 2011-10-21 MED ORDER — LISINOPRIL 40 MG PO TABS
40.0000 mg | ORAL_TABLET | ORAL | Status: DC
  Administered 2011-10-23 – 2011-10-24 (×2): 40 mg via ORAL
  Filled 2011-10-21 (×4): qty 1

## 2011-10-21 MED ORDER — BUPROPION HCL ER (SR) 100 MG PO TB12
100.0000 mg | ORAL_TABLET | Freq: Two times a day (BID) | ORAL | Status: DC
  Administered 2011-10-21 – 2011-10-24 (×6): 100 mg via ORAL
  Filled 2011-10-21 (×7): qty 1

## 2011-10-21 MED ORDER — METOCLOPRAMIDE HCL 10 MG PO TABS: 5.0000 mg | ORAL_TABLET | Freq: Three times a day (TID) | ORAL | Status: DC | PRN

## 2011-10-21 MED ORDER — ZOLPIDEM TARTRATE 5 MG PO TABS: 5.0000 mg | ORAL_TABLET | Freq: Every evening | ORAL | Status: DC | PRN

## 2011-10-21 MED ORDER — ONDANSETRON HCL 4 MG/2ML IJ SOLN
INTRAMUSCULAR | Status: DC | PRN
  Administered 2011-10-21: 4 mg via INTRAVENOUS

## 2011-10-21 MED ORDER — BISACODYL 5 MG PO TBEC
5.0000 mg | DELAYED_RELEASE_TABLET | Freq: Every day | ORAL | Status: DC | PRN
  Administered 2011-10-22: 5 mg via ORAL
  Filled 2011-10-21: qty 1

## 2011-10-21 MED ORDER — PHENOL 1.4 % MT LIQD
1.0000 | OROMUCOSAL | Status: DC | PRN
  Filled 2011-10-21: qty 177

## 2011-10-21 MED ORDER — ENOXAPARIN SODIUM 30 MG/0.3ML ~~LOC~~ SOLN
30.0000 mg | Freq: Two times a day (BID) | SUBCUTANEOUS | Status: DC
  Administered 2011-10-21 – 2011-10-23 (×4): 30 mg via SUBCUTANEOUS
  Filled 2011-10-21 (×6): qty 0.3

## 2011-10-21 MED ORDER — ONDANSETRON HCL 4 MG/2ML IJ SOLN
4.0000 mg | Freq: Four times a day (QID) | INTRAMUSCULAR | Status: DC | PRN
Start: 2011-10-21 — End: 2011-10-21

## 2011-10-21 MED ORDER — CLONIDINE HCL 0.2 MG PO TABS
0.2000 mg | ORAL_TABLET | Freq: Every day | ORAL | Status: DC
  Administered 2011-10-21 – 2011-10-23 (×3): 0.2 mg via ORAL
  Filled 2011-10-21 (×4): qty 1

## 2011-10-21 MED ORDER — ALUM & MAG HYDROXIDE-SIMETH 200-200-20 MG/5ML PO SUSP: 30.0000 mL | ORAL | Status: DC | PRN

## 2011-10-21 MED ORDER — VITAMIN D3 25 MCG (1000 UNIT) PO TABS
2000.0000 [IU] | ORAL_TABLET | Freq: Every day | ORAL | Status: DC
  Administered 2011-10-21 – 2011-10-24 (×4): 2000 [IU] via ORAL
  Filled 2011-10-21 (×4): qty 2

## 2011-10-21 MED ORDER — MIDAZOLAM HCL 5 MG/5ML IJ SOLN
INTRAMUSCULAR | Status: DC | PRN
  Administered 2011-10-21: 2 mg via INTRAVENOUS

## 2011-10-21 MED ORDER — DOCUSATE SODIUM 100 MG PO CAPS
100.0000 mg | ORAL_CAPSULE | Freq: Two times a day (BID) | ORAL | Status: DC
  Administered 2011-10-21 – 2011-10-24 (×6): 100 mg via ORAL
  Filled 2011-10-21 (×7): qty 1

## 2011-10-21 MED ORDER — METOPROLOL SUCCINATE ER 100 MG PO TB24
100.0000 mg | ORAL_TABLET | ORAL | Status: DC
  Administered 2011-10-23 – 2011-10-24 (×2): 100 mg via ORAL
  Filled 2011-10-21 (×4): qty 1

## 2011-10-21 MED ORDER — LACTATED RINGERS IV SOLN
INTRAVENOUS | Status: DC | PRN
  Administered 2011-10-21 (×2): via INTRAVENOUS

## 2011-10-21 MED ORDER — METHOCARBAMOL 100 MG/ML IJ SOLN
500.0000 mg | Freq: Once | INTRAVENOUS | Status: AC
  Administered 2011-10-21: 500 mg via INTRAVENOUS
  Filled 2011-10-21: qty 5

## 2011-10-21 MED ORDER — HYDROCODONE-ACETAMINOPHEN 5-325 MG PO TABS
1.0000 | ORAL_TABLET | ORAL | Status: DC | PRN
  Administered 2011-10-21 – 2011-10-24 (×6): 2 via ORAL
  Filled 2011-10-21 (×6): qty 2

## 2011-10-21 MED ORDER — PANTOPRAZOLE SODIUM 40 MG PO TBEC
40.0000 mg | DELAYED_RELEASE_TABLET | Freq: Every day | ORAL | Status: DC
  Administered 2011-10-21 – 2011-10-24 (×4): 40 mg via ORAL
  Filled 2011-10-21 (×4): qty 1

## 2011-10-21 MED ORDER — HYDROMORPHONE HCL PF 1 MG/ML IJ SOLN
0.2500 mg | INTRAMUSCULAR | Status: DC | PRN
  Administered 2011-10-21 (×4): 0.5 mg via INTRAVENOUS

## 2011-10-21 MED ORDER — METHOCARBAMOL 100 MG/ML IJ SOLN
500.0000 mg | Freq: Four times a day (QID) | INTRAVENOUS | Status: DC | PRN
  Filled 2011-10-21: qty 5

## 2011-10-21 MED ORDER — DIPHENHYDRAMINE HCL 12.5 MG/5ML PO ELIX
12.5000 mg | ORAL_SOLUTION | ORAL | Status: DC | PRN
  Administered 2011-10-22: 25 mg via ORAL
  Filled 2011-10-21: qty 10

## 2011-10-21 MED ORDER — BUPIVACAINE-EPINEPHRINE PF 0.5-1:200000 % IJ SOLN
INTRAMUSCULAR | Status: DC | PRN
  Administered 2011-10-21: 30 mL

## 2011-10-21 MED ORDER — ACETAMINOPHEN 650 MG RE SUPP: 650.0000 mg | Freq: Four times a day (QID) | RECTAL | Status: DC | PRN

## 2011-10-21 MED ORDER — ONDANSETRON HCL 4 MG PO TABS: 4.0000 mg | ORAL_TABLET | Freq: Four times a day (QID) | ORAL | Status: DC | PRN

## 2011-10-21 MED ORDER — WARFARIN SODIUM 7.5 MG PO TABS
7.5000 mg | ORAL_TABLET | Freq: Once | ORAL | Status: AC
  Administered 2011-10-21: 7.5 mg via ORAL
  Filled 2011-10-21: qty 1

## 2011-10-21 MED ORDER — WARFARIN VIDEO: Freq: Once | Status: DC

## 2011-10-21 MED ORDER — MIDAZOLAM HCL 2 MG/2ML IJ SOLN: 1.0000 mg | Freq: Once | INTRAMUSCULAR | Status: DC

## 2011-10-21 MED ORDER — KCL IN DEXTROSE-NACL 20-5-0.45 MEQ/L-%-% IV SOLN
INTRAVENOUS | Status: DC
  Administered 2011-10-21 – 2011-10-22 (×2): via INTRAVENOUS
  Filled 2011-10-21 (×10): qty 1000

## 2011-10-21 MED ORDER — PROPOFOL 10 MG/ML IV EMUL
INTRAVENOUS | Status: DC | PRN
  Administered 2011-10-21: 250 mg via INTRAVENOUS

## 2011-10-21 MED ORDER — HYDROMORPHONE HCL PF 1 MG/ML IJ SOLN
INTRAMUSCULAR | Status: AC
  Filled 2011-10-21: qty 1

## 2011-10-21 MED ORDER — HYDROMORPHONE HCL PF 1 MG/ML IJ SOLN
0.5000 mg | INTRAMUSCULAR | Status: DC | PRN
  Administered 2011-10-21 – 2011-10-23 (×5): 1 mg via INTRAVENOUS
  Filled 2011-10-21 (×5): qty 1

## 2011-10-21 MED ORDER — CITALOPRAM HYDROBROMIDE 40 MG PO TABS
40.0000 mg | ORAL_TABLET | ORAL | Status: DC
  Administered 2011-10-22 – 2011-10-24 (×3): 40 mg via ORAL
  Filled 2011-10-21 (×4): qty 1

## 2011-10-21 MED ORDER — METHOCARBAMOL 500 MG PO TABS
500.0000 mg | ORAL_TABLET | Freq: Four times a day (QID) | ORAL | Status: DC | PRN
  Administered 2011-10-21 – 2011-10-23 (×6): 500 mg via ORAL
  Filled 2011-10-21 (×6): qty 1

## 2011-10-21 MED ORDER — DOXAZOSIN MESYLATE 4 MG PO TABS
4.0000 mg | ORAL_TABLET | Freq: Every day | ORAL | Status: DC
  Administered 2011-10-21 – 2011-10-23 (×3): 4 mg via ORAL
  Filled 2011-10-21 (×4): qty 1

## 2011-10-21 MED ORDER — FENTANYL CITRATE 0.05 MG/ML IJ SOLN: 50.0000 ug | INTRAMUSCULAR | Status: DC | PRN

## 2011-10-21 MED ORDER — MENTHOL 3 MG MT LOZG: 1.0000 | LOZENGE | OROMUCOSAL | Status: DC | PRN

## 2011-10-21 MED ORDER — SENNOSIDES-DOCUSATE SODIUM 8.6-50 MG PO TABS: 1.0000 | ORAL_TABLET | Freq: Every evening | ORAL | Status: DC | PRN

## 2011-10-21 MED ORDER — COUMADIN BOOK
Freq: Once | Status: AC
  Administered 2011-10-21: 19:00:00
  Filled 2011-10-21: qty 1

## 2011-10-21 MED ORDER — METOCLOPRAMIDE HCL 5 MG/ML IJ SOLN
5.0000 mg | Freq: Three times a day (TID) | INTRAMUSCULAR | Status: DC | PRN
  Filled 2011-10-21: qty 2

## 2011-10-21 MED ORDER — ACETAMINOPHEN 325 MG PO TABS: 650.0000 mg | ORAL_TABLET | Freq: Four times a day (QID) | ORAL | Status: DC | PRN

## 2011-10-21 MED ORDER — SODIUM CHLORIDE 0.9 % IR SOLN
Status: DC | PRN
  Administered 2011-10-21: 1000 mL
  Administered 2011-10-21: 3000 mL

## 2011-10-21 SURGICAL SUPPLY — 57 items
BANDAGE ESMARK 6X9 LF (GAUZE/BANDAGES/DRESSINGS) ×1 IMPLANT
BLADE SAG 18X100X1.27 (BLADE) ×2 IMPLANT
BLADE SAW SGTL 13X75X1.27 (BLADE) ×2 IMPLANT
BLADE SURG ROTATE 9660 (MISCELLANEOUS) IMPLANT
BNDG ELASTIC 6X10 VLCR STRL LF (GAUZE/BANDAGES/DRESSINGS) IMPLANT
BNDG ESMARK 6X9 LF (GAUZE/BANDAGES/DRESSINGS) ×2
BOWL SMART MIX CTS (DISPOSABLE) ×2 IMPLANT
CEMENT HV SMART SET (Cement) ×4 IMPLANT
CLOTH BEACON ORANGE TIMEOUT ST (SAFETY) ×2 IMPLANT
COVER BACK TABLE 24X17X13 BIG (DRAPES) IMPLANT
COVER SURGICAL LIGHT HANDLE (MISCELLANEOUS) ×4 IMPLANT
CUFF TOURNIQUET SINGLE 34IN LL (TOURNIQUET CUFF) ×2 IMPLANT
CUFF TOURNIQUET SINGLE 44IN (TOURNIQUET CUFF) IMPLANT
DRAPE EXTREMITY T 121X128X90 (DRAPE) ×2 IMPLANT
DRAPE U-SHAPE 47X51 STRL (DRAPES) ×4 IMPLANT
DURAPREP 26ML APPLICATOR (WOUND CARE) ×2 IMPLANT
ELECT REM PT RETURN 9FT ADLT (ELECTROSURGICAL) ×2
ELECTRODE REM PT RTRN 9FT ADLT (ELECTROSURGICAL) ×1 IMPLANT
EVACUATOR 1/8 PVC DRAIN (DRAIN) ×2 IMPLANT
GAUZE XEROFORM 1X8 LF (GAUZE/BANDAGES/DRESSINGS) IMPLANT
GLOVE BIO SURGEON STRL SZ 6.5 (GLOVE) ×2 IMPLANT
GLOVE BIO SURGEON STRL SZ7 (GLOVE) IMPLANT
GLOVE BIO SURGEON STRL SZ7.5 (GLOVE) ×2 IMPLANT
GLOVE BIO SURGEON STRL SZ8.5 (GLOVE) ×2 IMPLANT
GLOVE BIOGEL PI IND STRL 7.0 (GLOVE) ×1 IMPLANT
GLOVE BIOGEL PI IND STRL 8 (GLOVE) ×1 IMPLANT
GLOVE BIOGEL PI INDICATOR 7.0 (GLOVE) ×1
GLOVE BIOGEL PI INDICATOR 8 (GLOVE) ×1
GLOVE SURG SS PI 8.5 STRL IVOR (GLOVE) ×1
GLOVE SURG SS PI 8.5 STRL STRW (GLOVE) ×1 IMPLANT
GOWN PREVENTION PLUS XLARGE (GOWN DISPOSABLE) ×2 IMPLANT
GOWN PREVENTION PLUS XXLARGE (GOWN DISPOSABLE) ×2 IMPLANT
GOWN STRL NON-REIN LRG LVL3 (GOWN DISPOSABLE) ×2 IMPLANT
HANDPIECE INTERPULSE COAX TIP (DISPOSABLE) ×1
HOOD PEEL AWAY FACE SHEILD DIS (HOOD) ×4 IMPLANT
KIT BASIN OR (CUSTOM PROCEDURE TRAY) ×2 IMPLANT
KIT ROOM TURNOVER OR (KITS) ×2 IMPLANT
MANIFOLD NEPTUNE II (INSTRUMENTS) ×2 IMPLANT
NS IRRIG 1000ML POUR BTL (IV SOLUTION) ×2 IMPLANT
PACK TOTAL JOINT (CUSTOM PROCEDURE TRAY) ×2 IMPLANT
PAD ARMBOARD 7.5X6 YLW CONV (MISCELLANEOUS) ×4 IMPLANT
PADDING CAST COTTON 6X4 STRL (CAST SUPPLIES) IMPLANT
SET HNDPC FAN SPRY TIP SCT (DISPOSABLE) ×1 IMPLANT
SPONGE GAUZE 4X4 12PLY (GAUZE/BANDAGES/DRESSINGS) ×2 IMPLANT
STAPLER VISISTAT 35W (STAPLE) ×2 IMPLANT
SUCTION FRAZIER TIP 10 FR DISP (SUCTIONS) ×2 IMPLANT
SURGIFLO TRUKIT (HEMOSTASIS) IMPLANT
SUT VIC AB 0 CTX 36 (SUTURE) ×2
SUT VIC AB 0 CTX36XBRD ANTBCTR (SUTURE) ×1 IMPLANT
SUT VIC AB 1 CTX 36 (SUTURE) ×1
SUT VIC AB 1 CTX36XBRD ANBCTR (SUTURE) ×1 IMPLANT
SUT VIC AB 2-0 CT1 27 (SUTURE) ×1
SUT VIC AB 2-0 CT1 TAPERPNT 27 (SUTURE) ×1 IMPLANT
TOWEL OR 17X24 6PK STRL BLUE (TOWEL DISPOSABLE) ×2 IMPLANT
TOWEL OR 17X26 10 PK STRL BLUE (TOWEL DISPOSABLE) ×2 IMPLANT
TRAY FOLEY CATH 14FR (SET/KITS/TRAYS/PACK) ×2 IMPLANT
WATER STERILE IRR 1000ML POUR (IV SOLUTION) ×4 IMPLANT

## 2011-10-21 NOTE — Progress Notes (Signed)
Orthopedic Tech Progress Note Patient Details:  Bailey [name] [birth year] [id number] Viewed order for trapeze bar patient helper from rn order list     [name] 10/21/2011, 4:43 PM

## 2011-10-21 NOTE — Anesthesia Postprocedure Evaluation (Signed)
Anesthesia Post Note  Patient: Bailey [name]  Procedure(s) Performed:  TOTAL KNEE ARTHROPLASTY - DEPUY SIGMA RP  Anesthesia type: General  Patient location: PACU  Post pain: Pain level controlled and Adequate analgesia  Post assessment: Post-op Vital signs reviewed, Patient's Cardiovascular Status Stable, Respiratory Function Stable, Patent Airway and Pain level controlled  Last Vitals:  Filed Vitals:   10/21/11 1402  BP: 149/90  Pulse: 82  Temp:   Resp: 9    Post vital signs: Reviewed and stable  Level of consciousness: awake, alert  and oriented  Complications: No apparent anesthesia complications

## 2011-10-21 NOTE — Plan of Care (Signed)
Problem: Consults Goal: Diagnosis- Total Joint Replacement Outcome: Completed/Met Date Met:  10/21/11 Primary Total Knee     

## 2011-10-21 NOTE — Interval H&P Note (Signed)
History and Physical Interval Note:  10/21/2011 11:16 AM  Bailey [name]  has presented today for surgery, with the diagnosis of OSTEOARTHRITIS RIGHT KNEE W/ VLAGUS DEFORMITY  The various methods of treatment have been discussed with the patient and family. After consideration of risks, benefits and other options for treatment, the patient has consented to  Procedure(s): TOTAL KNEE ARTHROPLASTY as a surgical intervention .  The patients' history has been reviewed, patient examined, no change in status, stable for surgery.  I have reviewed the patients' chart and labs.  Questions were answered to the patient's satisfaction.     Nestor Lewandowsky

## 2011-10-21 NOTE — Anesthesia Preprocedure Evaluation (Signed)
Anesthesia Evaluation  Patient identified by MRN, date of birth, ID band Patient awake    Reviewed: Allergy & Precautions, H&P , NPO status , Patient's Chart, lab work & pertinent test results  Airway Mallampati: II  Neck ROM: full    Dental   Pulmonary          Cardiovascular hypertension,     Neuro/Psych PSYCHIATRIC DISORDERS Anxiety Depression CVA    GI/Hepatic GERD-  ,  Endo/Other    Renal/GU      Musculoskeletal  (+) Arthritis -,   Abdominal   Peds  Hematology   Anesthesia Other Findings   Reproductive/Obstetrics                           Anesthesia Physical Anesthesia Plan  ASA: II  Anesthesia Plan: General and Regional   Post-op Pain Management: MAC Combined w/ Regional for Post-op pain   Induction: Intravenous  Airway Management Planned: LMA  Additional Equipment:   Intra-op Plan:   Post-operative Plan:   Informed Consent: I have reviewed the patients History and Physical, chart, labs and discussed the procedure including the risks, benefits and alternatives for the proposed anesthesia with the patient or authorized representative who has indicated his/her understanding and acceptance.     Plan Discussed with: CRNA and Surgeon  Anesthesia Plan Comments:         Anesthesia Quick Evaluation

## 2011-10-21 NOTE — Transfer of Care (Signed)
Immediate Anesthesia Transfer of Care Note  Patient: Bailey [name]  Procedure(s) Performed:  TOTAL KNEE ARTHROPLASTY - DEPUY SIGMA RP  Patient Location: PACU  Anesthesia Type: GA combined with regional for post-op pain  Level of Consciousness: awake, alert  and oriented  Airway & Oxygen Therapy: Patient Spontanous Breathing and Patient connected to nasal cannula oxygen  Post-op Assessment: Report given to PACU RN and Post -op Vital signs reviewed and stable  Post vital signs: Reviewed and stable  Complications: No apparent anesthesia complications

## 2011-10-21 NOTE — Progress Notes (Signed)
ANTICOAGULATION CONSULT NOTE - Initial Consult  Pharmacy Consult for Coumadin Indication: VTE prophylaxis s/p R TKR  Allergies  Allergen Reactions  . Codeine Nausea Only  . Sulfa Drugs Cross Reactors Other (See Comments)    Its been a long time ago    Patient Measurements: Weight = 84.7 kg  Vital Signs: Temp: 98.1 F (36.7 C) (01/21 1556) Temp src: Oral (01/21 1556) BP: 131/74 mmHg (01/21 1556) Pulse Rate: 64  (01/21 1556)  Labs: SCr 0.94 H/H: 13.1/35.9, PLTC 247 INR 0.99   Medical History: Past Medical History  Diagnosis Date  . Hypertension     all BP meds. managed by Dr. Eula Listen    . Stroke     2005, R sided weakness   . Pneumonia     while at [facility].- [facility]'s told that she had pneumonia & was isolated   . GERD (gastroesophageal reflux disease)   . Arthritis     OA- R knee, L hip   . Depression   . Anxiety     "Breakdown" in the 1990's, hosp. for >1 wk. at [facility]    . Incontinence of urine     weak bladder    Medications:  Prescriptions prior to admission  Medication Sig Dispense Refill  . Ascorbic Acid (VITAMIN C PO) Take 1 tablet by mouth daily.        Marland Kitchen aspirin 81 MG tablet Take 160 mg by mouth daily.        Marland Kitchen buPROPion (WELLBUTRIN SR) 100 MG 12 hr tablet Take 100 mg by mouth 2 (two) times daily.       . Cholecalciferol (VITAMIN D3) 2000 UNITS capsule Take 2,000 Units by mouth daily.        . citalopram (CELEXA) 40 MG tablet Take 40 mg by mouth every morning.       . cloNIDine (CATAPRES) 0.2 MG tablet Take 0.2 mg by mouth at bedtime.       Marland Kitchen doxazosin (CARDURA) 4 MG tablet Take 4 mg by mouth at bedtime.       Marland Kitchen lisinopril (PRINIVIL,ZESTRIL) 20 MG tablet Take 40 mg by mouth every morning.       . meloxicam (MOBIC) 15 MG tablet Take 15 mg by mouth every morning.       . metoprolol (TOPROL-XL) 100 MG 24 hr tablet Take 100 mg by mouth every morning.       Marland Kitchen omeprazole (PRILOSEC) 40 MG capsule Take 40 mg by mouth every morning.       . potassium  chloride SA (K-DUR,KLOR-CON) 20 MEQ tablet Take 20 mEq by mouth 2 (two) times daily.       Marland Kitchen triamterene-hydrochlorothiazide (MAXZIDE-25) 37.5-25 MG per tablet Take 1 tablet by mouth every morning.         Assessment: 56 yo F on Coumadin for VTE prophylaxis s/p elective TKR.  Noted on ASA PTA.  Goal of Therapy:  INR 2-3   Plan:  Coumadin 7.5mg  PO x 1 tonight. Daily INR. Coumadin book and video.  [employer], Pharm.D., BCPS Clinical Pharmacist Pager [phone]  10/21/2011,5:45 PM

## 2011-10-21 NOTE — Progress Notes (Signed)
Orthopedic Tech Progress Note Patient Details:  Bailey [name] 1956-02-06 [id number] Continuous passive motion machine;trapeze bar patient helper     [name] 10/21/2011, 4:09 PM

## 2011-10-21 NOTE — Preoperative (Signed)
Beta Blockers   Reason not to administer Beta Blockers:Not Applicable, took metoprolol this am 

## 2011-10-21 NOTE — Op Note (Signed)
PATIENT ID:      Bailey [name]  MRN:     [id number] DOB/AGE:    1956/01/17 / 56 y.o.       OPERATIVE REPORT    DATE OF PROCEDURE:  10/21/2011       PREOPERATIVE DIAGNOSIS:   OSTEOARTHRITIS RIGHT KNEE W/ VALGUS DEFORMITY      There is no height or weight on file to calculate BMI.                                                        POSTOPERATIVE DIAGNOSIS:   OSTEOARTHRITIS RIGHT KNEE W/ VALGUS DEFORMITY                                                                      PROCEDURE:  Procedure(s): TOTAL KNEE ARTHROPLASTY Using Depuy Sigma RP implants #3 Femur, #3Tibia, 10mm sigma RP bearing, 35 Patella     SURGEON: Jeffie Spivack J    ASSISTANT:  Daneille Laliberte   (Present and scrubbed throughout the case, critical for assistance with exposure, retraction, instrumentation, and closure.)         ANESTHESIA:GLMA   DRAINS: foley and 2 med hvac  TOURNIQUET TIME:   COMPLICATIONS:  None    SPECIMENS: None   INDICATIONS FOR PROCEDURE: The patient has  OSTEOARTHRITIS RIGHT KNEE W/ VALGUS DEFORMITY, varus deformities, XR shows bone on bone arthritis. Patient has failed all conservative measures including anti-inflammatory medicines, narcotics, attempts at  exercise and weight loss, cortisone injections and viscosupplementation.  Risks and benefits of surgery have been discussed, questions answered.   DESCRIPTION OF PROCEDURE: The patient identified by armband, received  right femoral nerve block and IV antibiotics, in the holding area at [facility]. Patient taken to the operating room, appropriate anesthetic  monitors were attached General endotracheal anesthesia induced with  the patient in supine position, Foley catheter was inserted. Tourniquet  applied high to the operative thigh. Lateral post and foot positioner  applied to the table, the lower extremity was then prepped and draped  in usual sterile fashion from the ankle to the tourniquet. Time-out procedure was  performed. The limb was wrapped with an Esmarch bandage and the tourniquet inflated to 350 mmHg. We began the operation by making the anterior midline incision starting at  handbreadth above the patella going over the patella 1 cm medial to and  4 cm distal to the tibial tubercle. Small bleeders in the skin and the  subcutaneous tissue identified and cauterized. Transverse retinaculum was incised and reflected medially and a medial parapatellar arthrotomy was accomplished. the patella was everted and theprepatellar fat pad resected. The superficial medial collateral  ligament was then elevated from anterior to posterior along the proximal  flare of the tibia and anterior half of the menisci resected. The knee was hyperflexed exposing bone on bone arthritis. Peripheral and notch osteophytes as well as the cruciate ligaments were then resected. We continued to  work our way around posteriorly along the proximal tibia, and externally  rotated the tibia subluxing it out from underneath the  femur. A McHale  retractor was placed through the notch and a lateral Hohmann retractor  placed, and we then drilled through the proximal tibia in line with the  axis of the tibia followed by an intramedullary guide rod and 2-degree  posterior slope cutting guide. The tibial cutting guide was pinned into place  allowing resection of 7 mm of bone medially and about 2 mm of bone  laterally because of her varus deformity. Satisfied with the tibial resection, we then  entered the distal femur 2 mm anterior to the PCL origin with the  intramedullary guide rod and applied the distal femoral cutting guide  set at 11mm, with 5 degrees of valgus. This was pinned along the  epicondylar axis. At this point, the distal femoral cut was accomplished without difficulty. We then sized for a #3 femoral component and pinned the guide in 0 degrees of external rotation.The chamfer cutting guide was pinned into place. The anterior,  posterior, and chamfer cuts were accomplished without difficulty followed by  the Sigma RP box cutting guide and the box cut. We also removed posterior osteophytes from the posterior femoral condyles. At this  time, the knee was brought into full extension. We checked our  extension and flexion gaps and found them symmetric at 10mm.  The patella thickness measured at 23 mm. We felt a 35 patella would  fit. We set the cutting guide at 15 and removed the posterior 9.5-10 mm  of the patella sized for 35 button and drilled the lollipop. The knee  was then once again hyperflexed exposing the proximal tibia. We sized for a #3 tibial base plate, applied the smokestack and the conical reamer followed by the the Delta fin keel punch. We then hammered into place the Sigma RP trial femoral component, inserted a 10-mm trial bearing, trial patellar button, and took the knee through range of motion from 0-130 degrees. No thumb pressure was required for patellar  tracking. At this point, all trial components were removed, a double batch of DePuy HV cement with 1500 mg of Zinacef was mixed and applied to all bony metallic mating surfaces except for the posterior condyles of the femur itself. In order, we  hammered into place the tibial tray and removed excess cement, the femoral component and removed excess cement, a 10-mm Sigma RP bearing  was inserted, and the knee brought to full extension with compression.  The patellar button was clamped into place, and excess cement  removed. While the cement cured the wound was irrigated out with normal saline solution pulse lavage, and medium Hemovac drains were placed from an anterolateral  approach. Ligament stability and patellar tracking were checked and found to be excellent. The parapatellar arthrotomy was closed with  running #1 Vicryl suture. The subcutaneous tissue with 0 and 2-0 undyed  Vicryl suture, and the skin with skin staples. A dressing of Xeroform,  4 x 4,  dressing sponges, Webril, and Ace wrap applied. The patient  awakened, extubated, and taken to recovery room without difficulty.   Gean Birchwood J 10/21/2011, 1:27 PM

## 2011-10-21 NOTE — Anesthesia Procedure Notes (Addendum)
Anesthesia Regional Block:  Femoral nerve block  Pre-Anesthetic Checklist: ,, timeout performed, Correct Patient, Correct Site, Correct Laterality, Correct Procedure,, site marked, risks and benefits discussed, Surgical consent,  Pre-op evaluation,  At surgeon's request and post-op pain management  Laterality: Right  Prep: chloraprep       Needles:  Injection technique: Single-shot  Needle Type: Echogenic Stimulator Needle     Needle Length: 9cm  Needle Gauge: 21    Additional Needles:  Procedures: nerve stimulator Femoral nerve block  Nerve Stimulator or Paresthesia:  Response: Quadriceps muscle contraction, 0.45 mA,   Additional Responses:   Narrative:  Start time: 10/21/2011 11:20 AM Injection made incrementally with aspirations every 5 mL.  Performed by: Personally  Anesthesiologist: Dr Chaney Malling  Additional Notes: Functioning IV was confirmed and monitors were applied.  A 90mm 21ga Arrow echogenic stimulator needle was used. Sterile prep and drape,hand hygiene and sterile gloves were used.  Negative aspiration and negative test dose prior to incremental administration of local anesthetic. The patient tolerated the procedure well.    Femoral nerve block Procedure Name: LMA Insertion Date/Time: 10/21/2011 11:40 AM Performed by: Delbert Harness Pre-anesthesia Checklist: Patient identified, Timeout performed, Emergency Drugs available, Suction available and Patient being monitored Patient Re-evaluated:Patient Re-evaluated prior to inductionOxygen Delivery Method: Circle System Utilized Preoxygenation: Pre-oxygenation with 100% oxygen Intubation Type: IV induction LMA: LMA inserted LMA Size: 4.0 Number of attempts: 1 Placement Confirmation: positive ETCO2 and breath sounds checked- equal and bilateral Tube secured with: Tape Dental Injury: Teeth and Oropharynx as per pre-operative assessment

## 2011-10-22 ENCOUNTER — Encounter (HOSPITAL_COMMUNITY): Payer: Self-pay | Admitting: Orthopedic Surgery

## 2011-10-22 LAB — BASIC METABOLIC PANEL
BUN: 21 mg/dL (ref 6–23)
Creatinine, Ser: 1.25 mg/dL — ABNORMAL HIGH (ref 0.50–1.10)
GFR calc Af Amer: 54 mL/min — ABNORMAL LOW (ref >90)
GFR calc non Af Amer: 47 mL/min — ABNORMAL LOW (ref >90)

## 2011-10-22 LAB — PROTIME-INR: INR: 1.26 (ref 0.00–1.49)

## 2011-10-22 LAB — CBC
HCT: 26.4 % — ABNORMAL LOW (ref 36.0–46.0)
MCH: 29.4 pg (ref 26.0–34.0)
MCHC: 35.6 g/dL (ref 30.0–36.0)
MCV: 82.5 fL (ref 78.0–100.0)
RDW: 13.2 % (ref 11.5–15.5)

## 2011-10-22 MED ORDER — WARFARIN SODIUM 5 MG PO TABS
5.0000 mg | ORAL_TABLET | Freq: Once | ORAL | Status: AC
  Administered 2011-10-22: 5 mg via ORAL
  Filled 2011-10-22: qty 1

## 2011-10-22 MED ORDER — ALPRAZOLAM 0.5 MG PO TABS
0.5000 mg | ORAL_TABLET | Freq: Once | ORAL | Status: AC
  Administered 2011-10-22: 0.5 mg via ORAL
  Filled 2011-10-22: qty 1

## 2011-10-22 MED ORDER — OXYCODONE HCL 5 MG PO TABS
10.0000 mg | ORAL_TABLET | ORAL | Status: DC | PRN
  Administered 2011-10-22 – 2011-10-24 (×6): 10 mg via ORAL
  Filled 2011-10-22 (×6): qty 2

## 2011-10-22 MED ORDER — ALPRAZOLAM 0.5 MG PO TABS
0.5000 mg | ORAL_TABLET | Freq: Four times a day (QID) | ORAL | Status: DC | PRN
  Administered 2011-10-23: 0.5 mg via ORAL
  Filled 2011-10-22: qty 1

## 2011-10-22 NOTE — Progress Notes (Signed)
Occupational Therapy Evaluation Patient Details Name: Bailey [name] MRN: [id number] DOB: [birth year] Today's Date: 10/22/2011  Problem List:  Patient Active Problem List  Diagnoses  . 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 Hypertension     all BP meds. managed by Dr. Eula Listen    . Stroke     2005, R sided weakness   . Pneumonia     while at [facility].- 1990's told that she had pneumonia & was isolated   . GERD (gastroesophageal reflux disease)   . Arthritis     OA- R knee, L hip   . Depression   . Anxiety     "Breakdown" in the 1990's, hosp. for >1 wk. at [facility]    . Incontinence of urine     weak bladder   Past Surgical History:  Past Surgical History  Procedure Date  . Thyroidectomy, partial   . Abdominal hysterectomy   . Tubal ligation   . Dilation and curettage of uterus     1980's  . Total knee arthroplasty 10/21/2011    Procedure: TOTAL KNEE ARTHROPLASTY;  Surgeon: Nestor Lewandowsky, MD;  Location: [facility] OR;  Service: Orthopedics;  Laterality: Right;  DEPUY SIGMA RP    OT Assessment/Plan/Recommendation OT Assessment Clinical Impression Statement: Pt. presents s/p Rt. TKA and with increased pain. Pt. will benefit from skilled OT to decrease burden of care at next venue of care by getting pt. to supervision level at D/C OT Recommendation Follow Up Recommendations: Skilled nursing facility Equipment Recommended: Defer to next venue OT Goals Acute Rehab OT Goals OT Goal Formulation: With patient Time For Goal Achievement: 2 weeks ADL Goals Pt Will Perform Grooming: with set-up;with supervision;Standing at sink ADL Goal: Grooming - Progress: Goal set today Pt Will Perform Lower Body Bathing: with set-up;with supervision;Standing at sink ADL Goal: Lower Body Bathing - Progress: Goal set today Pt Will Perform Lower Body Dressing: with set-up;with supervision;Sit to stand from bed;with adaptive  equipment ADL Goal: Lower Body Dressing - Progress: Goal set today Pt Will Transfer to Toilet: with supervision;3-in-1;with DME;Ambulation ADL Goal: Toilet Transfer - Progress: Goal set today  OT Evaluation Precautions/Restrictions  Precautions Precautions: Knee Precaution Booklet Issued: No Required Braces or Orthoses: No Restrictions Weight Bearing Restrictions: Yes RLE Weight Bearing: Weight bearing as tolerated Prior Functioning Home Living Lives With: Alone Type of Home: Apartment Home Layout: One level Home Access: Level entry Bathroom Shower/Tub: Tub/shower unit;Curtain Firefighter: Standard Bathroom Accessibility: Yes How Accessible: Accessible via walker Home Adaptive Equipment: Straight cane Prior Function Level of Independence: Independent with basic ADLs;Independent with gait;Independent with transfers;Requires assistive device for independence Able to Take Stairs?: Yes Driving: No Vocation: Unemployed Comments: Pt's mother does driving for pt. ADL ADL Eating/Feeding: Performed;Independent Where Assessed - Eating/Feeding: Chair Grooming: Performed;Wash/dry face;Set up;Minimal assistance Grooming Details (indicate cue type and reason): Min assist for balance while standing at sink due to Rt. LE weakness Where Assessed - Grooming: Standing at sink Upper Body Bathing: Simulated;Chest;Right arm;Left arm;Abdomen;Set up Where Assessed - Upper Body Bathing: Sitting, bed Lower Body Bathing: Simulated;Moderate assistance Where Assessed - Lower Body Bathing: Sitting, bed Upper Body Dressing: Performed;Set up Upper Body Dressing Details (indicate cue type and reason): with donning gown Where Assessed - Upper Body Dressing: Sitting, bed Lower Body Dressing: Simulated;Maximal assistance Lower Body Dressing Details (indicate cue type and reason): Pt. unable to reach bil LE Where Assessed - Lower Body  Dressing: Sit to stand from bed Toilet Transfer: Simulated;+2 Total  assistance;Comment for patient % (pt=60%) Toilet Transfer Details (indicate cue type and reason): With mod verbal cues for hand placement and transfer technique Toilet Transfer Method: Stand pivot Toilet Transfer Equipment: Other (comment) Nurse, children's) Toileting - Clothing Manipulation: Simulated;Minimal assistance Toileting - Clothing Manipulation Details (indicate cue type and reason): with moving gown Where Assessed - Glass blower/designer Manipulation: Standing Toileting - Hygiene: Simulated;Minimal assistance Where Assessed - Toileting Hygiene: Standing Tub/Shower Transfer: Not assessed Tub/Shower Transfer Method: Not assessed Equipment Used: Rolling walker Ambulation Related to ADLs: pt. provided with total assist +2 pt=60% for ambulation intermittently able to only need mod assist due to Rt. LE knee buckling  ADL Comments: Pt. educated on use of AE for LB ADLs to increase independence at D/C Cognition Cognition Arousal/Alertness: Awake/alert Overall Cognitive Status: Appears within functional limits for tasks assessed Orientation Level: Oriented X4 Sensation/Coordination Sensation Light Touch: Appears Intact Stereognosis: Not tested Hot/Cold: Not tested Proprioception: Not tested Coordination Gross Motor Movements are Fluid and Coordinated: Yes Fine Motor Movements are Fluid and Coordinated: Yes Extremity Assessment RUE Assessment RUE Assessment: Within Functional Limits LUE Assessment LUE Assessment: Within Functional Limits Mobility  Bed Mobility Bed Mobility: Yes Supine to Sit: 3: Mod assist;With rails;HOB elevated (Comment degrees) (HOB 45 degrees.) Supine to Sit Details (indicate cue type and reason): Assist for right LE due to pain and trunk with cues for sequence. Sitting - Scoot to Edge of Bed: 4: Min assist;With rail Sitting - Scoot to [place] of Bed Details (indicate cue type and reason): Assist for right LE due to pain with cues for sequence. Transfers Transfers:  Yes Sit to Stand: 1: +2 Total assist;Patient percentage (comment);With upper extremity assist;From bed ((pt=60%)) Sit to Stand Details (indicate cue type and reason): Assist to translate trunk anterior over BOS with cues for hand and right LE placement.   Stand to Sit: 1: +2 Total assist;Patient percentage (comment);With upper extremity assist;To chair/3-in-1 ((pt=60%)) Stand to Sit Details: Assist to slow eccentric descent to chair with cues for hand and right LE placement.  Cues for sequence. End of Session OT - End of Session Equipment Utilized During Treatment: Gait belt Activity Tolerance: Patient tolerated treatment well Patient left: in chair;with call bell in reach Nurse Communication: Mobility status for transfers General Behavior During Session: [facility] for tasks performed Cognition: [facility] for tasks performed  Co-evaluation with Flora Lipps, OTR/L Pager [phone] 10/22/2011, 11:35 AM

## 2011-10-22 NOTE — Progress Notes (Signed)
Physical Therapy Treatment Patient Details Name: Bailey [name] MRN: [id number] DOB: 08-20-1956 Today's Date: 10/22/2011  PT Assessment/Plan  PT - Assessment/Plan Comments on Treatment Session: Pt admitted s/p right TKA and is motivated to progress.  Able to ambulate BID today with distance limited by pain. PT Plan: Discharge plan remains appropriate;Frequency remains appropriate PT Frequency: 7X/week Follow Up Recommendations: Skilled nursing facility Equipment Recommended: Defer to next venue PT Goals  Acute Rehab PT Goals PT Goal Formulation: With patient Time For Goal Achievement: 7 days Pt will go Supine/Side to Sit: with supervision PT Goal: Supine/Side to Sit - Progress: Goal set today Pt will go Sit to Supine/Side: with supervision PT Goal: Sit to Supine/Side - Progress: Goal set today Pt will go Sit to Stand: with supervision PT Goal: Sit to Stand - Progress: Progressing toward goal Pt will go Stand to Sit: with supervision PT Goal: Stand to Sit - Progress: Progressing toward goal Pt will Ambulate: 51 - 150 feet;with supervision;with least restrictive assistive device PT Goal: Ambulate - Progress: Progressing toward goal Pt will Perform Home Exercise Program: with supervision, verbal cues required/provided PT Goal: Perform Home Exercise Program - Progress: Progressing toward goal  PT Treatment Precautions/Restrictions  Precautions Precautions: Knee Precaution Booklet Issued: No Required Braces or Orthoses: No Restrictions Weight Bearing Restrictions: Yes RLE Weight Bearing: Weight bearing as tolerated Pain 7/10 in right knee.  RN aware and pt repositioned after treatment. Mobility (including Balance) Bed Mobility Bed Mobility: No Supine to Sit: Not tested (comment) Supine to Sit Details (indicate cue type and reason): Assist for right LE due to pain and trunk with cues for sequence. Sitting - Scoot to Edge of Bed: Not tested (comment) Sitting - Scoot to Edge of  Bed Details (indicate cue type and reason): Assist for right LE due to pain with cues for sequence. Transfers Transfers: Yes Sit to Stand: 4: Min assist;With upper extremity assist;From chair/3-in-1 (2 trials.) Sit to Stand Details (indicate cue type and reason): Assist to translate trunk anterior with cues for hand placement and safety. Stand to Sit: 3: Mod assist;With upper extremity assist;To chair/3-in-1;To bed (2 trials.) Stand to Sit Details: Assist to control eccentric descent to chair with cues for hand and right LE placement. Ambulation/Gait Ambulation/Gait: Yes Ambulation/Gait Assistance: 4: Min assist Ambulation/Gait Assistance Details (indicate cue type and reason): Assist to off weight right LE due to pain and buckling in stance phase.  Cues for sequence and to increase bilateral UE assist with right LE stance. Ambulation Distance (Feet): 20 Feet Assistive device: Rolling walker Gait Pattern: Step-to pattern;Decreased step length - right;Decreased stance time - right;Right flexed knee in stance;Trunk flexed Stairs: No Wheelchair Mobility Wheelchair Mobility: No  Posture/Postural Control Posture/Postural Control: No significant limitations Balance Balance Assessed: No Exercise  Total Joint Exercises Ankle Circles/Pumps: AROM;Right;10 reps;Supine Quad Sets: AROM;Right;10 reps;Supine Heel Slides: AAROM;Right;10 reps;Supine Long Arc Quad: AAROM;Right;10 reps;Seated End of Session PT - End of Session Equipment Utilized During Treatment: Gait belt Activity Tolerance: Patient tolerated treatment well Patient left: in bed;with call bell in reach;with family/visitor present (At EOB for bath with mother present.  RN/CNA aware.) Nurse Communication: Mobility status for transfers;Mobility status for ambulation General Behavior During Session: [facility] for tasks performed Cognition: [facility] for tasks performed  Cephus Shelling 10/22/2011, 12:58 PM  10/22/2011 Cephus Shelling, PT,  DPT [phone]

## 2011-10-22 NOTE — Progress Notes (Addendum)
ANTICOAGULATION CONSULT NOTE -Follow Up Note  Pharmacy Consult for Coumadin Indication: VTE prophylaxis s/p R TKR  Allergies  Allergen Reactions  . Codeine Nausea Only  . Sulfa Drugs Cross Reactors Other (See Comments)    Its been a long time ago    Patient Measurements: Height = 63 inches Weight = 84.7 kg  Vital Signs: Temp: 98 F (36.7 C) (01/22 0618) BP: 102/59 mmHg (01/22 0618) Pulse Rate: 75  (01/22 0618)  Labs:  Preop  SCr 0.94 H/H: 13.1/35.9, PLTC 247 INR 0.99  Basename 10/22/11 0649  HGB 9.4*  HCT 26.4*  PLT 198  APTT --  LABPROT 16.1*  INR 1.26  HEPARINUNFRC --  CREATININE 1.25*  CKTOTAL --  CKMB --  TROPONINI --   CrCl is unknown because there is no height on file for the current visit.    Medical History: Past Medical History  Diagnosis Date  . Hypertension     all BP meds. managed by Dr. Eula Listen    . Stroke     2005, R sided weakness   . Pneumonia     while at [facility].- 1990's told that she had pneumonia & was isolated   . GERD (gastroesophageal reflux disease)   . Arthritis     OA- R knee, L hip   . Depression   . Anxiety     "Breakdown" in the 1990's, hosp. for >1 wk. at [facility]    . Incontinence of urine     weak bladder    Medications:  Prescriptions prior to admission  Medication Sig Dispense Refill  . Ascorbic Acid (VITAMIN C PO) Take 1 tablet by mouth daily.        Marland Kitchen aspirin 81 MG tablet Take 160 mg by mouth daily.        Marland Kitchen buPROPion (WELLBUTRIN SR) 100 MG 12 hr tablet Take 100 mg by mouth 2 (two) times daily.       . Cholecalciferol (VITAMIN D3) 2000 UNITS capsule Take 2,000 Units by mouth daily.        . citalopram (CELEXA) 40 MG tablet Take 40 mg by mouth every morning.       . cloNIDine (CATAPRES) 0.2 MG tablet Take 0.2 mg by mouth at bedtime.       Marland Kitchen doxazosin (CARDURA) 4 MG tablet Take 4 mg by mouth at bedtime.       Marland Kitchen lisinopril (PRINIVIL,ZESTRIL) 20 MG tablet Take 40 mg by mouth every morning.       . meloxicam  (MOBIC) 15 MG tablet Take 15 mg by mouth every morning.       . metoprolol (TOPROL-XL) 100 MG 24 hr tablet Take 100 mg by mouth every morning.       Marland Kitchen omeprazole (PRILOSEC) 40 MG capsule Take 40 mg by mouth every morning.       . potassium chloride SA (K-DUR,KLOR-CON) 20 MEQ tablet Take 20 mEq by mouth 2 (two) times daily.       Marland Kitchen triamterene-hydrochlorothiazide (MAXZIDE-25) 37.5-25 MG per tablet Take 1 tablet by mouth every morning.          Medications:  Scheduled:    . buPROPion  100 mg Oral BID  .  ceFAZolin (ANCEF) IV  2 g Intravenous 60 min Pre-Op  . cholecalciferol  2,000 Units Oral Daily  . citalopram  40 mg Oral Q0700  . cloNIDine  0.2 mg Oral QHS  . coumadin book   Does not apply Once  . docusate  sodium  100 mg Oral BID  . doxazosin  4 mg Oral QHS  . enoxaparin  30 mg Subcutaneous Q12H  . lisinopril  40 mg Oral Q0700  . methocarbamol(ROBAXIN) IV  500 mg Intravenous Once  . metoprolol succinate  100 mg Oral Q0700  . pantoprazole  40 mg Oral Q1200  . potassium chloride SA  20 mEq Oral BID  . triamterene-hydrochlorothiazide  1 each Oral Q0700  . warfarin  7.5 mg Oral ONCE-1800  . warfarin   Does not apply Once  . DISCONTD: HYDROmorphone      . DISCONTD: midazolam  1 mg Intravenous Once  . DISCONTD: Vitamin D3  2,000 Units Oral Daily    Assessment: 56 yo F on Coumadin for VTE prophylaxis POD#1 s/p elective TKR-right.  Noted on ASA PTA.  INR today = 1.26, H/H 9.4/26.4, pltc 198. No bleeding noted. On Lovenox 30mg  sq q12hr bridge until INR =/>1.8. SCr has increased to 1.25 today. If continues to increase may need to consider holding lisinopril.  Goal of Therapy:  INR 1.5-2.0 per Dr. Turner Daniels   Plan:  Coumadin 5mg  PO x 1 today. Daily INR.   Arman Filter Clinical Pharmacist Pager [phone] 10/22/2011,11:04 AM

## 2011-10-22 NOTE — Progress Notes (Signed)
Physical Therapy Evaluation Patient Details Name: Bailey [name] MRN: [id number] DOB: 21-Dec-1955 Today's Date: 10/22/2011  Problem List:  Patient Active Problem List  Diagnoses  . 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 Hypertension     all BP meds. managed by Dr. Eula Listen    . Stroke     2005, R sided weakness   . Pneumonia     while at [facility].- 1990's told that she had pneumonia & was isolated   . GERD (gastroesophageal reflux disease)   . Arthritis     OA- R knee, L hip   . Depression   . Anxiety     "Breakdown" in the 1990's, hosp. for >1 wk. at [facility]    . Incontinence of urine     weak bladder   Past Surgical History:  Past Surgical History  Procedure Date  . Thyroidectomy, partial   . Abdominal hysterectomy   . Tubal ligation   . Dilation and curettage of uterus     1980's  . Total knee arthroplasty 10/21/2011    Procedure: TOTAL KNEE ARTHROPLASTY;  Surgeon: Nestor Lewandowsky, MD;  Location: [facility] OR;  Service: Orthopedics;  Laterality: Right;  DEPUY SIGMA RP    PT Assessment/Plan/Recommendation PT Assessment Clinical Impression Statement: Pt is a 56 y/o female admitted s/p right TKA along with the below PT problem list.  Pt would benefit from acute PT to maximize independence and facilitate d/c to SNF. PT Recommendation/Assessment: Patient will need skilled PT in the acute care venue PT Problem List: Decreased strength;Decreased range of motion;Decreased activity tolerance;Decreased balance;Decreased mobility;Decreased knowledge of use of DME;Decreased knowledge of precautions;Pain Barriers to Discharge: Decreased caregiver support PT Therapy Diagnosis : Difficulty walking;Acute pain PT Plan PT Frequency: 7X/week PT Treatment/Interventions: DME instruction;Gait training;Functional mobility training;Therapeutic activities;Therapeutic exercise;Balance training;Patient/family education PT  Recommendation Follow Up Recommendations: Skilled nursing facility Equipment Recommended: Defer to next venue PT Goals  Acute Rehab PT Goals PT Goal Formulation: With patient Time For Goal Achievement: 7 days Pt will go Supine/Side to Sit: with supervision PT Goal: Supine/Side to Sit - Progress: Goal set today Pt will go Sit to Supine/Side: with supervision PT Goal: Sit to Supine/Side - Progress: Goal set today Pt will go Sit to Stand: with supervision PT Goal: Sit to Stand - Progress: Goal set today Pt will go Stand to Sit: with supervision PT Goal: Stand to Sit - Progress: Goal set today Pt will Ambulate: 51 - 150 feet;with supervision;with least restrictive assistive device PT Goal: Ambulate - Progress: Goal set today Pt will Perform Home Exercise Program: with supervision, verbal cues required/provided PT Goal: Perform Home Exercise Program - Progress: Goal set today  PT Evaluation Precautions/Restrictions  Precautions Precautions: Knee Precaution Booklet Issued: No Required Braces or Orthoses: No Restrictions Weight Bearing Restrictions: Yes RLE Weight Bearing: Weight bearing as tolerated Prior Functioning  Home Living Lives With: Alone Type of Home: Apartment Home Layout: One level Home Access: Level entry Bathroom Shower/Tub: Tub/shower unit;Curtain Firefighter: Standard Bathroom Accessibility: Yes How Accessible: Accessible via walker Home Adaptive Equipment: Straight cane Prior Function Level of Independence: Independent with basic ADLs;Independent with gait;Independent with transfers;Requires assistive device for independence Able to Take Stairs?: Yes Driving: No Vocation: Unemployed Comments: Pt's mother does driving for pt. Cognition Cognition Arousal/Alertness: Awake/alert Overall Cognitive Status: Appears within functional limits for tasks assessed Orientation Level: Oriented X4 Sensation/Coordination Sensation Light Touch: Appears  Intact  Stereognosis: Not tested Hot/Cold: Not tested Proprioception: Not tested Coordination Gross Motor Movements are Fluid and Coordinated: Yes Fine Motor Movements are Fluid and Coordinated: Yes Extremity Assessment RUE Assessment RUE Assessment: Within Functional Limits LUE Assessment LUE Assessment: Within Functional Limits RLE Assessment RLE Assessment: Exceptions to [facility] RLE AROM (degrees) Right Knee Extension 0-130: 5  Right Knee Flexion 0-140: 50  RLE Strength RLE Overall Strength: Deficits;Due to pain RLE Overall Strength Comments: 2-/5 LLE Assessment LLE Assessment: Within Functional Limits Pain 4/10 in right knee with treatment.  RN aware and pt repositioned. Mobility (including Balance) Bed Mobility Bed Mobility: Yes Supine to Sit: 3: Mod assist;With rails;HOB elevated (Comment degrees) (HOB 45 degrees.) Supine to Sit Details (indicate cue type and reason): Assist for right LE due to pain and trunk with cues for sequence. Sitting - Scoot to Edge of Bed: 4: Min assist;With rail Sitting - Scoot to [place] of Bed Details (indicate cue type and reason): Assist for right LE due to pain with cues for sequence. Transfers Transfers: Yes Sit to Stand: 1: +2 Total assist;Patient percentage (comment);With upper extremity assist;From bed ((pt=60%)) Sit to Stand Details (indicate cue type and reason): Assist to translate trunk anterior over BOS with cues for hand and right LE placement.   Stand to Sit: 1: +2 Total assist;Patient percentage (comment);With upper extremity assist;To chair/3-in-1 ((pt=60%)) Stand to Sit Details: Assist to slow eccentric descent to chair with cues for hand and right LE placement.  Cues for sequence. Ambulation/Gait Ambulation/Gait: Yes Ambulation/Gait Assistance: 1: +2 Total assist;Patient percentage (comment) ((pt=50%)) Ambulation/Gait Assistance Details (indicate cue type and reason): Assist to off weight right LE during stance due to knee buckling.   Max cues for sequence inside RW.   Ambulation Distance (Feet): 20 Feet Assistive device: Rolling walker Gait Pattern: Step-to pattern;Decreased step length - right;Decreased stance time - right;Right flexed knee in stance;Trunk flexed Stairs: No Wheelchair Mobility Wheelchair Mobility: No  Posture/Postural Control Posture/Postural Control: No significant limitations Balance Balance Assessed: No Exercise  Total Joint Exercises Ankle Circles/Pumps: AROM;Right;10 reps;Supine Quad Sets: AROM;Right;10 reps;Supine Heel Slides: AAROM;Right;10 reps;Supine End of Session PT - End of Session Equipment Utilized During Treatment: Gait belt Activity Tolerance: Patient tolerated treatment well Patient left: in chair;with call bell in reach Nurse Communication: Mobility status for transfers;Mobility status for ambulation General Behavior During Session: [facility] for tasks performed Cognition: [facility] for tasks performed Co-evaluation with OT.  Cephus Shelling 10/22/2011, 10:46 AM  10/22/2011 Cephus Shelling, PT, DPT [phone]

## 2011-10-22 NOTE — Progress Notes (Signed)
Patient has pre-registered at [facility] for SNF at d/c. CSW Psychosocial Assessment and FL2 placed in shadow chart in [facility]. Will proceed with SNF and advise. Reece Levy, MSW, Theresia Majors [phone]

## 2011-10-22 NOTE — Progress Notes (Signed)
Patient ID: Bailey [name], female   DOB: October 03, 1955, 56 y.o.   MRN: [id number] PATIENT ID: Bailey [name]  MRN: [id number]  DOB/AGE:  08-08-1956 / 56 y.o.  1 Day Post-Op Procedure(s) (LRB): TOTAL KNEE ARTHROPLASTY (Right)    PROGRESS NOTE Subjective: Patient is alert, oriented, no Nausea, no Vomiting, yes passing gas, no Bowel Movement. Taking PO well. Denies SOB, Chest or Calf Pain. Using Incentive Spirometer, PAS in place. Ambulate wbat, CPM 0-30 Patient reports pain as 4 on 0-10 scale  .    Objective: Vital signs in last 24 hours: Filed Vitals:   10/21/11 1556 10/21/11 2204 10/22/11 0230 10/22/11 0618  BP: 131/74 106/77 94/67 102/59  Pulse: 64 63 77 75  Temp: 98.1 F (36.7 C) 97.5 F (36.4 C) 98.3 F (36.8 C) 98 F (36.7 C)  TempSrc: Oral     Resp: 16 19 19 19   SpO2: 95% 93% 93% 94%      Intake/Output from previous day: I/O last 3 completed shifts: In: 2770 [P.O.:720; I.V.:2050] Out: 1000 [Urine:975; Drains:25]   Intake/Output this shift:     LABORATORY DATA:  Basename 10/22/11 0649  WBC 5.6  HGB 9.4*  HCT 26.4*  PLT 198  NA --  K --  CL --  CO2 --  BUN --  CREATININE --  GLUCOSE --  GLUCAP --  INR 1.26  CALCIUM --    Examination: Neurologically intact ABD soft Neurovascular intact Sensation intact distally Intact pulses distally Dorsiflexion/Plantar flexion intact Incision: no drainage No cellulitis present Compartment soft} Blood and plasma separated in drain indicating minimal recent drainage, drain pulled without difficulty.  Assessment:   1 Day Post-Op Procedure(s) (LRB): TOTAL KNEE ARTHROPLASTY (Right) ADDITIONAL DIAGNOSIS:    Plan: PT/OT WBAT, CPM 5/hrs day until ROM 0-90 degrees, then D/C CPM DVT Prophylaxis:  Lovenox\Coumadin bridge target INR 1.5-2.0 DISCHARGE PLAN: Skilled Nursing Facility/Rehab DISCHARGE NEEDS: HHPT, HHRN, CPM, Walker and 3-in-1 comode seat     Armas Mcbee J 10/22/2011, 7:57 AM

## 2011-10-23 LAB — CBC
HCT: 23.6 % — ABNORMAL LOW (ref 36.0–46.0)
Hemoglobin: 8.5 g/dL — ABNORMAL LOW (ref 12.0–15.0)
MCH: 29.3 pg (ref 26.0–34.0)
MCV: 81.4 fL (ref 78.0–100.0)
RBC: 2.9 MIL/uL — ABNORMAL LOW (ref 3.87–5.11)
WBC: 5.3 10*3/uL (ref 4.0–10.5)

## 2011-10-23 MED ORDER — OXYCODONE-ACETAMINOPHEN 5-325 MG PO TABS: 1.0000 | ORAL_TABLET | ORAL | Status: AC | PRN

## 2011-10-23 MED ORDER — WARFARIN SODIUM 5 MG PO TABS: ORAL_TABLET | ORAL | Status: DC

## 2011-10-23 MED ORDER — METHOCARBAMOL 500 MG PO TABS: ORAL_TABLET | ORAL | Status: AC

## 2011-10-23 NOTE — Progress Notes (Signed)
CARE MANAGEMENT NOTE 10/23/2011 Discharge planning.Spoke with patient regarding Home Health, preoperatively setup with [facility],no changes with that, but she states she will go to [facility] at discharge.

## 2011-10-23 NOTE — Progress Notes (Signed)
Physical Therapy Treatment Patient Details Name: Bailey [name] MRN: [id number] DOB: 1956-03-18 Today's Date: 10/23/2011  PT Assessment/Plan  PT - Assessment/Plan Comments on Treatment Session: Pt admitted s/p right TKA and with increased pain today.  Pt refused earlier session, but able to be persuaded to participate later.  Limited by pain. PT Plan: Discharge plan remains appropriate;Frequency remains appropriate PT Frequency: 7X/week Follow Up Recommendations: Skilled nursing facility Equipment Recommended: Defer to next venue PT Goals  Acute Rehab PT Goals PT Goal Formulation: With patient Time For Goal Achievement: 7 days PT Goal: Sit to Stand - Progress: Progressing toward goal PT Goal: Stand to Sit - Progress: Progressing toward goal PT Goal: Ambulate - Progress: Progressing toward goal PT Goal: Perform Home Exercise Program - Progress: Progressing toward goal  PT Treatment Precautions/Restrictions  Precautions Precautions: Knee Precaution Booklet Issued: No Required Braces or Orthoses: No Restrictions Weight Bearing Restrictions: Yes RLE Weight Bearing: Weight bearing as tolerated Pain 8/10 in right knee.  Pt repositioned and RN aware. Mobility (including Balance) Bed Mobility Bed Mobility: No Supine to Sit: Not tested (comment) Sitting - Scoot to Edge of Bed: Not tested (comment) Transfers Transfers: Yes Sit to Stand: 4: Min assist;With upper extremity assist;From chair/3-in-1 Sit to Stand Details (indicate cue type and reason): Assist for balance and due to right LE pain to off weight during stance.  Cues for hand placement. Stand to Sit: 4: Min assist;With upper extremity assist;To chair/3-in-1 Stand to Sit Details: Assist to slow descent to chair with cues for hand and right LE placement. Ambulation/Gait Ambulation/Gait: Yes Ambulation/Gait Assistance: 4: Min assist Ambulation/Gait Assistance Details (indicate cue type and reason): Assist to off weight right  LE due to pain with cues for sequence and tall posture. Ambulation Distance (Feet): 40 Feet Assistive device: Rolling walker Gait Pattern: Step-to pattern;Decreased step length - right;Decreased stance time - right;Trunk flexed Gait velocity: Slow cadence. Stairs: No Wheelchair Mobility Wheelchair Mobility: No  Posture/Postural Control Posture/Postural Control: No significant limitations Balance Balance Assessed: No Exercise  Total Joint Exercises Ankle Circles/Pumps: AROM;Right;10 reps;Supine Quad Sets: AROM;Right;10 reps;Supine Heel Slides: AAROM;Right;10 reps;Supine Hip ABduction/ADduction: AAROM;Right;10 reps;Supine Straight Leg Raises: AAROM;Right;10 reps;Supine End of Session PT - End of Session Equipment Utilized During Treatment: Gait belt Activity Tolerance: Patient tolerated treatment well Patient left: in chair;with call bell in reach;with family/visitor present Nurse Communication: Mobility status for transfers;Mobility status for ambulation General Behavior During Session: [facility] for tasks performed Cognition: [facility] for tasks performed  Cephus Shelling 10/23/2011, 12:16 PM  10/23/2011 Cephus Shelling, PT, DPT [phone]

## 2011-10-23 NOTE — Progress Notes (Signed)
10/23/11 8:50 PT Note: Pt refused PT this am due to pain even with max encouragement and education on importance of mobility.  Will follow as able.  10/23/2011 Cephus Shelling, PT, DPT [phone]

## 2011-10-23 NOTE — Progress Notes (Signed)
ANTICOAGULATION CONSULT NOTE -Follow Up Note  Pharmacy Consult for Coumadin Indication: VTE prophylaxis s/p R TKR  Allergies  Allergen Reactions  . Codeine Nausea Only  . Sulfa Drugs Cross Reactors Other (See Comments)    Its been a long time ago    Patient Measurements: Height = 63 inches Weight = 84.7 kg  Vital Signs: Temp: 99.3 F (37.4 C) (01/23 0541) Temp src: Oral (01/23 0541) BP: 118/72 mmHg (01/23 0541) Pulse Rate: 91  (01/23 0541)  Labs:  Preop  SCr 0.94 H/H: 13.1/35.9, PLTC 247 INR 0.99  Basename 10/23/11 0535 10/22/11 0649  HGB 8.5* 9.4*  HCT 23.6* 26.4*  PLT 168 198  APTT -- --  LABPROT 23.8* 16.1*  INR 2.09* 1.26  HEPARINUNFRC -- --  CREATININE -- 1.25*  CKTOTAL -- --  CKMB -- --  TROPONINI -- --   Estimated Creatinine Clearance: 51.2 ml/min (by C-G formula based on Cr of 1.25).    Medical History: Past Medical History  Diagnosis Date  . Hypertension     all BP meds. managed by Dr. Eula Listen    . Stroke     2005, R sided weakness   . Pneumonia     while at [facility] 1990's told that she had pneumonia & was isolated   . GERD (gastroesophageal reflux disease)   . Arthritis     OA- R knee, L hip   . Depression   . Anxiety     "Breakdown" in the 1990's, hosp. for >1 wk. at [facility]    . Incontinence of urine     weak bladder    Medications:  Prescriptions prior to admission  Medication Sig Dispense Refill  . Ascorbic Acid (VITAMIN C PO) Take 1 tablet by mouth daily.        Marland Kitchen buPROPion (WELLBUTRIN SR) 100 MG 12 hr tablet Take 100 mg by mouth 2 (two) times daily.       . Cholecalciferol (VITAMIN D3) 2000 UNITS capsule Take 2,000 Units by mouth daily.        . citalopram (CELEXA) 40 MG tablet Take 40 mg by mouth every morning.       . cloNIDine (CATAPRES) 0.2 MG tablet Take 0.2 mg by mouth at bedtime.       Marland Kitchen doxazosin (CARDURA) 4 MG tablet Take 4 mg by mouth at bedtime.       Marland Kitchen lisinopril (PRINIVIL,ZESTRIL) 20 MG tablet Take 40 mg by  mouth every morning.       . metoprolol (TOPROL-XL) 100 MG 24 hr tablet Take 100 mg by mouth every morning.       Marland Kitchen omeprazole (PRILOSEC) 40 MG capsule Take 40 mg by mouth every morning.       . potassium chloride SA (K-DUR,KLOR-CON) 20 MEQ tablet Take 20 mEq by mouth 2 (two) times daily.       Marland Kitchen triamterene-hydrochlorothiazide (MAXZIDE-25) 37.5-25 MG per tablet Take 1 tablet by mouth every morning.       Marland Kitchen DISCONTD: aspirin 81 MG tablet Take 160 mg by mouth daily.        Marland Kitchen DISCONTD: meloxicam (MOBIC) 15 MG tablet Take 15 mg by mouth every morning.          Medications:  Scheduled:     . ALPRAZolam  0.5 mg Oral Once  . buPROPion  100 mg Oral BID  . cholecalciferol  2,000 Units Oral Daily  . citalopram  40 mg Oral Q0700  . cloNIDine  0.2 mg  Oral QHS  . docusate sodium  100 mg Oral BID  . doxazosin  4 mg Oral QHS  . enoxaparin  30 mg Subcutaneous Q12H  . lisinopril  40 mg Oral Q0700  . metoprolol succinate  100 mg Oral Q0700  . pantoprazole  40 mg Oral Q1200  . potassium chloride SA  20 mEq Oral BID  . triamterene-hydrochlorothiazide  1 each Oral Q0700  . warfarin  5 mg Oral ONCE-1800  . warfarin   Does not apply Once    Assessment: 56 yo F on Coumadin for VTE prophylaxis POD#2 s/p elective TKR-right.  Noted on ASA PTA.  INR increased to 2.09 today from 1.26. H /H 8.5/23.6, pltc 168. No bleeding noted. On Lovenox 30mg  sq q12hr bridge until INR =/>1.8. Thus,  DC Lovenox today.  CrCl ~ 51 ml/min. Patient to be dc'd to SNF/rehab today.   Goal of Therapy:  INR 1.5-2.0 per Dr. Turner Daniels   Plan: DC Lovenox today.  I recommend hold coumadin dose today due to increase from 1.26 to 2.09 today.  Monitor daily INR.    Arman Filter Clinical Pharmacist Pager [phone] 10/23/2011,9:35 AM

## 2011-10-23 NOTE — Discharge Summary (Signed)
Patient ID: Bailey [name] MRN: [id number] DOB/AGE: 58-Jun-1957 58 y.o.  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 Hypertension     all BP meds. managed by Dr. Eula Listen    . Stroke     2005, R sided weakness   . Pneumonia     while at [facility].- 1990's told that she had pneumonia & was isolated   . GERD (gastroesophageal reflux disease)   . Arthritis     OA- R knee, L hip   . Depression   . Anxiety     "Breakdown" in the 1990's, hosp. for >1 wk. at [facility]    . Incontinence of urine     weak bladder    Surgeries: Procedure(s): TOTAL KNEE ARTHROPLASTY on 10/21/2011   Consultants:    Discharged Condition: Improved  Hospital Course: Bailey [name] is an 56 y.o. female who was admitted 10/21/2011 for operative treatment ofArthritis of right knee. Patient has severe unremitting pain that affects sleep, daily activities, and work/hobbies. After pre-op clearance the patient was taken to the operating room on 10/21/2011 and underwent  Procedure(s): TOTAL KNEE ARTHROPLASTY.    Patient was given perioperative antibiotics: Anti-infectives     Start     Dose/Rate Route Frequency Ordered Stop   10/21/11 1212   cefUROXime (ZINACEF) injection  Status:  Discontinued          As needed 10/21/11 1213 10/21/11 1339   10/20/11 1315   ceFAZolin (ANCEF) IVPB 1 g/50 mL premix  Status:  Discontinued        1 g 100 mL/hr over 30 Minutes Intravenous 60 min pre-op 10/20/11 1305 10/20/11 1306   10/20/11 1315   ceFAZolin (ANCEF) IVPB 2 g/50 mL premix        2 g 100 mL/hr over 30 Minutes Intravenous 60 min pre-op 10/20/11 1306 10/21/11 1148           Patient was given sequential compression devices, early ambulation, and chemoprophylaxis to prevent DVT.  Patient benefited maximally from hospital stay and there were  no complications.    Recent vital signs: Patient Vitals for the past 24 hrs:  BP Temp Temp src Pulse Resp SpO2 Height Weight  10/23/11 0541 118/72 mmHg 99.3 F (37.4 C) Oral 91  18  100 % - -  11/15/2011 2153 108/52 mmHg 98.2 F (36.8 C) Oral 76  18  97 % - -  Nov 15, 2011 1659 112/79 mmHg - - - - - - -  Nov 15, 2011 1400 97/60 mmHg 98 F (36.7 C) - 69  16  96 % - -  11-15-2011 1100 - - - - - - 5\' 3"  (1.6 m) 84.701 kg (186 lb 11.7 oz)     Recent laboratory studies:  Basename 10/23/11 0535 Nov 15, 2011 0649  WBC 5.3 5.6  HGB 8.5* 9.4*  HCT 23.6* 26.4*  PLT 168 198  NA -- 135  K -- 3.5  CL -- 99  CO2 -- 30  BUN -- 21  CREATININE -- 1.25*  GLUCOSE -- 107*  INR 2.09* 1.26  CALCIUM -- 8.3*     Discharge Medications:   Medication List  As of 10/23/2011  7:42 AM   STOP taking these medications         aspirin 81 MG tablet      meloxicam 15 MG tablet  TAKE these medications         buPROPion 100 MG 12 hr tablet   Commonly known as: WELLBUTRIN SR   Take 100 mg by mouth 2 (two) times daily.      citalopram 40 MG tablet   Commonly known as: CELEXA   Take 40 mg by mouth every morning.      cloNIDine 0.2 MG tablet   Commonly known as: CATAPRES   Take 0.2 mg by mouth at bedtime.      doxazosin 4 MG tablet   Commonly known as: CARDURA   Take 4 mg by mouth at bedtime.      lisinopril 20 MG tablet   Commonly known as: PRINIVIL,ZESTRIL   Take 40 mg by mouth every morning.      methocarbamol 500 MG tablet   Commonly known as: ROBAXIN   1 tablet q 6hr prn muscle spasm      metoprolol succinate 100 MG 24 hr tablet   Commonly known as: TOPROL-XL   Take 100 mg by mouth every morning.      omeprazole 40 MG capsule   Commonly known as: PRILOSEC   Take 40 mg by mouth every morning.      oxyCODONE-acetaminophen 5-325 MG per tablet   Commonly known as: PERCOCET   Take 1 tablet by mouth every 4 (four) hours as needed for pain.      potassium chloride SA 20 MEQ tablet    Commonly known as: K-DUR,KLOR-CON   Take 20 mEq by mouth 2 (two) times daily.      triamterene-hydrochlorothiazide 37.5-25 MG per tablet   Commonly known as: MAXZIDE-25   Take 1 tablet by mouth every morning.      VITAMIN C PO   Take 1 tablet by mouth daily.      Vitamin D3 2000 UNITS capsule   Take 2,000 Units by mouth daily.      warfarin 5 MG tablet   Commonly known as: COUMADIN   Take as directed by HHN. INR target 1.5-2.5. D/C after 2 weeks from date of surgery            Diagnostic Studies: Dg Chest 2 View  10/14/2011  *RADIOLOGY REPORT*  Clinical Data: Hypertension.  CHEST - 2 VIEW  Comparison: 12/02/2008.  Findings: Trachea is midline.  Heart size normal.  There may be minimal scarring in the lingula.  Lungs are otherwise clear.  No pleural fluid.  IMPRESSION: No acute findings.  Original Report Authenticated By: Reyes Ivan, M.D.    Disposition: SNF [facility]  Discharge Orders    Future Orders Please Complete By Expires   Diet - low sodium heart healthy      Call MD / Call 911      Comments:   If you experience chest pain or shortness of breath, CALL 911 and be transported to the hospital emergency room.  If you develope a fever above 101 F, pus (white drainage) or increased drainage or redness at the wound, or calf pain, call your surgeon's office.   Constipation Prevention      Comments:   Drink plenty of fluids.  Prune juice may be helpful.  You may use a stool softener, such as Colace (over the counter) 100 mg twice a day.  Use MiraLax (over the counter) for constipation as needed.   Increase activity slowly as tolerated      Weight Bearing as taught in Physical Therapy      Comments:  Use a walker or crutches as instructed.   Patient may shower      Comments:   You may shower without a dressing once there is no drainage.  Do not wash over the wound.  If drainage remains, cover wound with plastic wrap and then shower.   Driving restrictions      Comments:    No driving for 2 weeks   CPM      Comments:   Continuous passive motion machine (CPM):      Use the CPM from 0 to 60 for 5 hours per day.      You may increase by 10 degrees per day.  You may break it up into 2 or 3 sessions per day.      Use CPM for 2 weeks or until you are told to stop.   Change dressing      Comments:   Change dressing on POD #5.  You may clean the incision with alcohol prior to redressing.   Do not put a pillow under the knee. Place it under the heel.         Follow-up Information    Schedule an appointment as soon as possible for a visit with Nestor Lewandowsky, MD in  10 days. (As needed if symptoms worsen)    Contact information:   [facility] [address] [place] [address] [phone]           Signed: Nestor Lewandowsky 10/23/2011, 7:42 AM

## 2011-10-23 NOTE — Progress Notes (Signed)
Patient ID: Bailey [name], female   DOB: [birth year], 56 y.o.   MRN: [id number] PATIENT ID: Bailey [name]  MRN: [id number]  DOB/AGE:  07-02-1956 / 56 y.o.  2 Days Post-Op Procedure(s) (LRB): TOTAL KNEE ARTHROPLASTY (Right)    PROGRESS NOTE Subjective: Patient is alert, oriented, no Nausea, no Vomiting, yes passing gas, no Bowel Movement. Taking PO well. Denies SOB, Chest or Calf Pain. Using Incentive Spirometer, PAS in place. Ambulate wbat in room, CPM 0-40 Patient reports pain as 4 on 0-10 scale  .    Objective: Vital signs in last 24 hours: Filed Vitals:   10/22/11 1400 10/22/11 1659 10/22/11 2153 10/23/11 0541  BP: 97/60 112/79 108/52 118/72  Pulse: 69  76 91  Temp: 98 F (36.7 C)  98.2 F (36.8 C) 99.3 F (37.4 C)  TempSrc:   Oral Oral  Resp: 16  18 18   Height:      Weight:      SpO2: 96%  97% 100%      Intake/Output from previous day: I/O last 3 completed shifts: In: 1590 [P.O.:840; I.V.:750] Out: 1075 [Urine:1075]   Intake/Output this shift:     LABORATORY DATA:  Basename 10/23/11 0535 10/22/11 0649  WBC 5.3 5.6  HGB 8.5* 9.4*  HCT 23.6* 26.4*  PLT 168 198  NA -- 135  K -- 3.5  CL -- 99  CO2 -- 30  BUN -- 21  CREATININE -- 1.25*  GLUCOSE -- 107*  GLUCAP -- --  INR 2.09* 1.26  CALCIUM -- 8.3*    Examination: Neurologically intact ABD soft Neurovascular intact Sensation intact distally Intact pulses distally Dorsiflexion/Plantar flexion intact Incision: no drainage No cellulitis present Compartment soft}  Assessment:   2 Days Post-Op Procedure(s) (LRB): TOTAL KNEE ARTHROPLASTY (Right) ADDITIONAL DIAGNOSIS:    Plan: PT/OT WBAT, CPM 5/hrs day until ROM 0-90 degrees, then D/C CPM DVT Prophylaxis:  Lovenox\Coumadin bridge target INR 1.5-2.0 DISCHARGE PLAN: Skilled Nursing Facility/Rehab DISCHARGE NEEDS: HHPT, HHRN, CPM, Walker, 3-in-1 comode seat and IV Antibiotics  OK for SNF at [facility], will place lighter dressing  today     Lashay Osborne J 10/23/2011, 7:35 AM

## 2011-10-23 NOTE — Progress Notes (Signed)
SNF bed available at [facility] for tomorrow-  Reece Levy, MSW, [employer] [phone]

## 2011-10-24 LAB — CBC
HCT: 23 % — ABNORMAL LOW (ref 36.0–46.0)
Hemoglobin: 8.3 g/dL — ABNORMAL LOW (ref 12.0–15.0)
MCV: 82.4 fL (ref 78.0–100.0)
Platelets: 172 10*3/uL (ref 150–400)
RBC: 2.79 MIL/uL — ABNORMAL LOW (ref 3.87–5.11)
WBC: 5 10*3/uL (ref 4.0–10.5)

## 2011-10-24 MED ORDER — WARFARIN SODIUM 2.5 MG PO TABS
2.5000 mg | ORAL_TABLET | Freq: Once | ORAL | Status: DC
  Filled 2011-10-24: qty 1

## 2011-10-24 NOTE — Progress Notes (Signed)
Physical Therapy Treatment Patient Details Name: Bailey [name] MRN: [id number] DOB: September 26, 1956 Today's Date: 10/24/2011  PT Assessment/Plan  PT - Assessment/Plan Comments on Treatment Session: Pt admitted s/p right TKA and is progressing very well.  Pt able to continue to increase ambulation distance and perform ther ex again as well. PT Plan: Discharge plan remains appropriate;Frequency remains appropriate PT Frequency: 7X/week Follow Up Recommendations: Skilled nursing facility Equipment Recommended: Defer to next venue PT Goals  Acute Rehab PT Goals PT Goal Formulation: With patient Time For Goal Achievement: 7 days PT Goal: Sit to Stand - Progress: Progressing toward goal PT Goal: Stand to Sit - Progress: Progressing toward goal PT Goal: Ambulate - Progress: Progressing toward goal PT Goal: Perform Home Exercise Program - Progress: Progressing toward goal  PT Treatment Precautions/Restrictions  Precautions Precautions: Knee Precaution Booklet Issued: No Required Braces or Orthoses: No Restrictions Weight Bearing Restrictions: Yes RLE Weight Bearing: Weight bearing as tolerated Pain 7/10 in right knee.  Pt repositioned after treatment. Mobility (including Balance) Bed Mobility Bed Mobility: No Supine to Sit: Not tested (comment) Sitting - Scoot to Edge of Bed: Not tested (comment) Transfers Transfers: Yes Sit to Stand: 4: Min assist;With upper extremity assist;From chair/3-in-1 (Min (guard); 2 trials.) Sit to Stand Details (indicate cue type and reason): Guarding only for balance with cues for hand placement. Stand to Sit: 4: Min assist;With upper extremity assist;To chair/3-in-1 (Min (guard); 2 trials.) Stand to Sit Details: Guarding only for balance with cues for hand/right LE placement and safety. Ambulation/Gait Ambulation/Gait: Yes Ambulation/Gait Assistance: 4: Min assist (Min (guard)) Ambulation/Gait Assistance Details (indicate cue type and reason): Guarding  for balance with cues to extend posture. Ambulation Distance (Feet): 30 Feet Assistive device: Rolling walker Gait Pattern: Step-to pattern;Decreased step length - right;Decreased stance time - right;Trunk flexed Gait velocity: Slow cadence. Stairs: No Wheelchair Mobility Wheelchair Mobility: No  Posture/Postural Control Posture/Postural Control: No significant limitations Balance Balance Assessed: No Exercise  Total Joint Exercises Ankle Circles/Pumps: AROM;Right;10 reps;Supine Quad Sets: AROM;Right;10 reps;Supine Heel Slides: AAROM;Right;10 reps;Supine Hip ABduction/ADduction: AAROM;Right;10 reps;Supine Straight Leg Raises: AAROM;Right;10 reps;Supine Long Arc Quad: AAROM;Right;10 reps;Seated End of Session PT - End of Session Equipment Utilized During Treatment: Gait belt Activity Tolerance: Patient tolerated treatment well Patient left: in chair;with call bell in reach;with family/visitor present Nurse Communication: Mobility status for transfers;Mobility status for ambulation General Behavior During Session: [facility] for tasks performed Cognition: [facility] for tasks performed  Cephus Shelling 10/24/2011, 1:21 PM  10/24/2011 Cephus Shelling, PT, DPT [phone]

## 2011-10-24 NOTE — Progress Notes (Signed)
ANTICOAGULATION CONSULT NOTE -Follow Up Note  Pharmacy Consult for Coumadin Indication: VTE prophylaxis s/p R TKR  Allergies  Allergen Reactions  . Codeine Nausea Only  . Sulfa Drugs Cross Reactors Other (See Comments)    Its been a long time ago    Patient Measurements: Height = 63 inches Weight = 84.7 kg  Vital Signs: Temp: 98.9 F (37.2 C) (01/24 0656) BP: 107/65 mmHg (01/24 0656) Pulse Rate: 87  (01/24 0656)  Labs:  Preop  SCr 0.94 H/H: 13.1/35.9, PLTC 247 INR 0.99  Basename 10/24/11 0505 10/23/11 0535 10/22/11 0649  HGB 8.3* 8.5* --  HCT 23.0* 23.6* 26.4*  PLT 172 168 198  APTT -- -- --  LABPROT 22.5* 23.8* 16.1*  INR 1.94* 2.09* 1.26  HEPARINUNFRC -- -- --  CREATININE -- -- 1.25*  CKTOTAL -- -- --  CKMB -- -- --  TROPONINI -- -- --   Estimated Creatinine Clearance: 51.2 ml/min (by C-G formula based on Cr of 1.25).  Assessment: 56 yo F on Coumadin for VTE prophylaxis POD#3 s/p elective TKR-right.  Noted on ASA PTA.  INR therapeutic for lower goal 1.5-2 but INR trending down past dose held yesterday. Hgb trending down. No bleeding noted. Patient to be dc'd to SNF/rehab soon.   Goal of Therapy:  INR 1.5-2.0 per Dr. Turner Daniels   Plan:  1) Coumadin 2.5mg  po today 2) Daily INR   Shona Simpson Hilario Quarry Clinical Pharmacist Pager [phone] 10/24/2011,9:47 AM

## 2011-10-24 NOTE — Progress Notes (Signed)
Patient for d/c today to SNF bed at [facility]. Patient agreeable to this plan- plan transfer via EMS. Reece Levy, MSW, [facility] [phone]

## 2011-10-24 NOTE — Progress Notes (Signed)
Patient ID: Bailey [name], female   DOB: June 24, 1956, 56 y.o.   MRN: [id number] PATIENT ID: Bailey [name]  MRN: [id number]  DOB/AGE:  09/18/1956 / 56 y.o.  3 Days Post-Op Procedure(s) (LRB): TOTAL KNEE ARTHROPLASTY (Right)    PROGRESS NOTE Subjective:2 Patient is alert, oriented, no Nausea, no Vomiting, yes passing gas, no Bowel Movement. Taking PO well. Denies SOB, Chest or Calf Pain. Using Incentive Spirometer, PAS in place. Ambulate hallway, CPM 0-40 Patient reports pain as 3 on 0-10 scale  .    Objective: Vital signs in last 24 hours: Filed Vitals:   10/23/11 0541 10/23/11 1400 10/23/11 2255 10/24/11 0656  BP: 118/72 151/85 98/62 107/65  Pulse: 91 80 75 87  Temp: 99.3 F (37.4 C) 99.3 F (37.4 C) 97.9 F (36.6 C) 98.9 F (37.2 C)  TempSrc: Oral     Resp: 18 16 18 18   Height:      Weight:      SpO2: 100%  94% 96%      Intake/Output from previous day: I/O last 3 completed shifts: In: 1320 [P.O.:1320] Out: 750 [Urine:750]   Intake/Output this shift:     LABORATORY DATA:  Basename 10/24/11 0505 10/23/11 0535 10/22/11 0649  WBC 5.0 5.3 --  HGB 8.3* 8.5* --  HCT 23.0* 23.6* --  PLT 172 168 --  NA -- -- 135  K -- -- 3.5  CL -- -- 99  CO2 -- -- 30  BUN -- -- 21  CREATININE -- -- 1.25*  GLUCOSE -- -- 107*  GLUCAP -- -- --  INR 1.94* 2.09* --  CALCIUM -- -- 8.3*    Examination: Neurologically intact ABD soft Neurovascular intact Sensation intact distally Intact pulses distally Dorsiflexion/Plantar flexion intact Incision: dressing C/D/I No cellulitis present Compartment soft}  Assessment:   3 Days Post-Op Procedure(s) (LRB): TOTAL KNEE ARTHROPLASTY (Right) ADDITIONAL DIAGNOSIS:    Plan: PT/OT WBAT, CPM 5/hrs day until ROM 0-90 degrees, then D/C CPM DVT Prophylaxis:  Lovenox\Coumadin bridge target INR 1.5-2.0 DISCHARGE PLAN: Skilled Nursing Facility/Rehab DISCHARGE NEEDS: HHPT, HHRN, CPM, Walker and 3-in-1 comode seat Will change dressing today  before SNF to [facility]    [facility] J 10/24/2011, 7:20 AM

## 2011-12-03 ENCOUNTER — Other Ambulatory Visit: Payer: Self-pay | Admitting: Orthopedic Surgery

## 2011-12-13 ENCOUNTER — Encounter (HOSPITAL_COMMUNITY): Payer: Self-pay | Admitting: Pharmacy Technician

## 2011-12-16 ENCOUNTER — Encounter (HOSPITAL_COMMUNITY): Payer: Self-pay

## 2011-12-16 ENCOUNTER — Encounter (HOSPITAL_COMMUNITY)
Admission: RE | Admit: 2011-12-16 | Discharge: 2011-12-16 | Disposition: A | Discharge status: Home / Self Care | Payer: Medicare Other | Source: Ambulatory Visit | Attending: Orthopedic Surgery | Admitting: Orthopedic Surgery

## 2011-12-16 LAB — CBC
Hemoglobin: 11.7 g/dL — ABNORMAL LOW (ref 12.0–15.0)
MCV: 80.1 fL (ref 78.0–100.0)
Platelets: 258 10*3/uL (ref 150–400)
RBC: 4.07 MIL/uL (ref 3.87–5.11)
WBC: 3 10*3/uL — ABNORMAL LOW (ref 4.0–10.5)

## 2011-12-16 LAB — TYPE AND SCREEN: Antibody Screen: NEGATIVE

## 2011-12-16 LAB — BASIC METABOLIC PANEL
CO2: 33 mEq/L — ABNORMAL HIGH (ref 19–32)
Calcium: 9.2 mg/dL (ref 8.4–10.5)
GFR calc non Af Amer: 55 mL/min — ABNORMAL LOW (ref >90)
Potassium: 3.4 mEq/L — ABNORMAL LOW (ref 3.5–5.1)
Sodium: 141 mEq/L (ref 135–145)

## 2011-12-16 LAB — SURGICAL PCR SCREEN: Staphylococcus aureus: NEGATIVE

## 2011-12-16 NOTE — Pre-Procedure Instructions (Signed)
[name]  12/16/2011   Your procedure is scheduled on:  Monday, March 25  Report to [facility] at 0530 AM.  Call this number if you have problems the morning of surgery: [phone]   Remember:   Do not eat food:After Midnight.  May have clear liquids:until Midnight .  Clear liquids include soda, tea, black coffee, apple or grape juice, broth.  Take these medicines the morning of surgery with A SIP OF WATER: *Celexa,Metoprolol,Omeprazole,Wellbutrin, Percocet**   Do not wear jewelry, make-up or nail polish.  Do not wear lotions, powders, or perfumes. You may wear deodorant.  Do not shave 48 hours prior to surgery.  Do not bring valuables to the hospital.  Contacts, dentures or bridgework may not be worn into surgery.  Leave suitcase in the car. After surgery it may be brought to your room.  For patients admitted to the hospital, checkout time is 11:00 AM the day of discharge.   Patients discharged the day of surgery will not be allowed to drive home.    Special Instructions: Incentive Spirometry - Practice and bring it with you on the day of surgery. and CHG Shower Use Special Wash: 1/2 bottle night before surgery and 1/2 bottle morning of surgery.   Please read over the following fact sheets that you were given: Pain Booklet, Coughing and Deep Breathing, Blood Transfusion Information, Total Joint Packet, MRSA Information and Surgical Site Infection Prevention

## 2011-12-17 NOTE — H&P (Signed)
Subjective: Bailey [name] returns in followup for her right knee which is status post total knee arthroplasty 6 weeks ago.  She reports that her knee is doing great and she has minimal complaints related to it.  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  She states that the pain is severe and limits her daily activities.  It also wakes her from sleep at night.  PAST MEDICAL HISTORY:  Significant for a stroke in 2005.  Since then she has had balance problems and headaches.  She also has hypertension, asthma, anxiety, and depression.  She has not listed her drug allergies or daily medications.  PAST SURGICAL HISTORY:  Significant for a hysterectomy, right TKA.  FAMILY HISTORY:  Positive for hypertension.  SOCIAL HISTORY:  She denies the use of tobacco and drinks occasional alcohol.  She is divorced.  REVIEW OF SYSTEMS:  Review of systems reviewed thoroughly and all other systems are negative aside from her musculoskeletal complaints as related to the chief complaint.  Objective: Examination of the right knee demonstrates a well-healed surgical incision without evidence of infection or skin breakdown.  Left hip has significant pain with any attempt at internal rotation.  She is neurovascularly intact.  Assess:  #1 status post right total knee arthroplasty dated 10/21/11. #2 end-stage arthritis of the left hip  Plan: We will have Bailey [name] talk to Olegario Messier today about scheduling her left hip surgery.  All risks and benefits of surgery were discussed with her today.

## 2011-12-22 MED ORDER — CHLORHEXIDINE GLUCONATE 4 % EX LIQD: 60.0000 mL | Freq: Once | CUTANEOUS | Status: DC

## 2011-12-22 MED ORDER — CEFAZOLIN SODIUM-DEXTROSE 2-3 GM-% IV SOLR
2.0000 g | INTRAVENOUS | Status: AC
  Administered 2011-12-23: 2 g via INTRAVENOUS
  Filled 2011-12-22: qty 50

## 2011-12-23 ENCOUNTER — Ambulatory Visit (HOSPITAL_COMMUNITY): Payer: Medicare Other | Admitting: Certified Registered"

## 2011-12-23 ENCOUNTER — Encounter (HOSPITAL_COMMUNITY): Payer: Self-pay | Admitting: Certified Registered"

## 2011-12-23 ENCOUNTER — Inpatient Hospital Stay (HOSPITAL_COMMUNITY)
Admission: RE | Admit: 2011-12-23 | Discharge: 2011-12-26 | DRG: 470 | Disposition: A | Discharge status: Skilled Nursing Facility | Payer: Medicare Other | Source: Ambulatory Visit | Attending: Orthopedic Surgery | Admitting: Orthopedic Surgery

## 2011-12-23 ENCOUNTER — Inpatient Hospital Stay (HOSPITAL_COMMUNITY): Payer: Medicare Other

## 2011-12-23 ENCOUNTER — Encounter (HOSPITAL_COMMUNITY)
Admission: RE | Disposition: A | Discharge status: Skilled Nursing Facility | Payer: Self-pay | Source: Ambulatory Visit | Attending: Orthopedic Surgery

## 2011-12-23 ENCOUNTER — Encounter (HOSPITAL_COMMUNITY): Payer: Self-pay | Admitting: *Deleted

## 2011-12-23 DIAGNOSIS — Z90710 Acquired absence of both cervix and uterus: Secondary | ICD-10-CM

## 2011-12-23 DIAGNOSIS — M1610 Unilateral primary osteoarthritis, unspecified hip: Principal | ICD-10-CM

## 2011-12-23 DIAGNOSIS — K219 Gastro-esophageal reflux disease without esophagitis: Secondary | ICD-10-CM | POA: Diagnosis present

## 2011-12-23 DIAGNOSIS — Z8673 Personal history of transient ischemic attack (TIA), and cerebral infarction without residual deficits: Secondary | ICD-10-CM

## 2011-12-23 DIAGNOSIS — F3289 Other specified depressive episodes: Secondary | ICD-10-CM | POA: Diagnosis present

## 2011-12-23 DIAGNOSIS — Z01812 Encounter for preprocedural laboratory examination: Secondary | ICD-10-CM

## 2011-12-23 DIAGNOSIS — M169 Osteoarthritis of hip, unspecified: Principal | ICD-10-CM | POA: Diagnosis present

## 2011-12-23 DIAGNOSIS — Z96659 Presence of unspecified artificial knee joint: Secondary | ICD-10-CM

## 2011-12-23 DIAGNOSIS — F329 Major depressive disorder, single episode, unspecified: Secondary | ICD-10-CM | POA: Diagnosis present

## 2011-12-23 DIAGNOSIS — I10 Essential (primary) hypertension: Secondary | ICD-10-CM | POA: Diagnosis present

## 2011-12-23 DIAGNOSIS — Z7901 Long term (current) use of anticoagulants: Secondary | ICD-10-CM

## 2011-12-23 DIAGNOSIS — J45909 Unspecified asthma, uncomplicated: Secondary | ICD-10-CM | POA: Diagnosis present

## 2011-12-23 DIAGNOSIS — Z79899 Other long term (current) drug therapy: Secondary | ICD-10-CM

## 2011-12-23 DIAGNOSIS — F411 Generalized anxiety disorder: Secondary | ICD-10-CM | POA: Diagnosis present

## 2011-12-23 HISTORY — PX: TOTAL HIP ARTHROPLASTY: SHX124

## 2011-12-23 SURGERY — ARTHROPLASTY, HIP, TOTAL,POSTERIOR APPROACH
Anesthesia: General | Site: Hip | Laterality: Left | Wound class: Clean

## 2011-12-23 MED ORDER — ACETAMINOPHEN 650 MG RE SUPP: 650.0000 mg | Freq: Four times a day (QID) | RECTAL | Status: DC | PRN

## 2011-12-23 MED ORDER — HYDROCODONE-ACETAMINOPHEN 5-325 MG PO TABS: 1.0000 | ORAL_TABLET | ORAL | Status: DC | PRN

## 2011-12-23 MED ORDER — VITAMIN D3 50 MCG (2000 UT) PO CAPS: 2000.0000 [IU] | ORAL_CAPSULE | Freq: Every day | ORAL | Status: DC

## 2011-12-23 MED ORDER — HYDROMORPHONE HCL PF 1 MG/ML IJ SOLN: 0.5000 mg | INTRAMUSCULAR | Status: DC | PRN

## 2011-12-23 MED ORDER — METOCLOPRAMIDE HCL 10 MG PO TABS: 5.0000 mg | ORAL_TABLET | Freq: Three times a day (TID) | ORAL | Status: DC | PRN

## 2011-12-23 MED ORDER — COUMADIN BOOK
Freq: Once | Status: AC
  Administered 2011-12-23: 14:00:00
  Filled 2011-12-23: qty 1

## 2011-12-23 MED ORDER — BUPIVACAINE-EPINEPHRINE 0.5% -1:200000 IJ SOLN
INTRAMUSCULAR | Status: DC | PRN
  Administered 2011-12-23: 20 mL

## 2011-12-23 MED ORDER — DEXTROSE-NACL 5-0.45 % IV SOLN: INTRAVENOUS | Status: DC

## 2011-12-23 MED ORDER — LACTATED RINGERS IV SOLN: INTRAVENOUS | Status: DC

## 2011-12-23 MED ORDER — ONDANSETRON HCL 4 MG/2ML IJ SOLN
INTRAMUSCULAR | Status: DC | PRN
  Administered 2011-12-23: 4 mg via INTRAVENOUS

## 2011-12-23 MED ORDER — BISACODYL 10 MG RE SUPP: 10.0000 mg | Freq: Every day | RECTAL | Status: DC | PRN

## 2011-12-23 MED ORDER — METOCLOPRAMIDE HCL 5 MG/ML IJ SOLN: 5.0000 mg | Freq: Three times a day (TID) | INTRAMUSCULAR | Status: DC | PRN

## 2011-12-23 MED ORDER — DOXAZOSIN MESYLATE 4 MG PO TABS
4.0000 mg | ORAL_TABLET | Freq: Every day | ORAL | Status: DC
  Administered 2011-12-24 – 2011-12-25 (×2): 4 mg via ORAL
  Filled 2011-12-23 (×4): qty 1

## 2011-12-23 MED ORDER — MIDAZOLAM HCL 5 MG/5ML IJ SOLN
INTRAMUSCULAR | Status: DC | PRN
  Administered 2011-12-23: 2 mg via INTRAVENOUS

## 2011-12-23 MED ORDER — ZOLPIDEM TARTRATE 5 MG PO TABS: 5.0000 mg | ORAL_TABLET | Freq: Every evening | ORAL | Status: DC | PRN

## 2011-12-23 MED ORDER — ROCURONIUM BROMIDE 100 MG/10ML IV SOLN
INTRAVENOUS | Status: DC | PRN
  Administered 2011-12-23: 50 mg via INTRAVENOUS

## 2011-12-23 MED ORDER — CLONIDINE HCL 0.2 MG PO TABS
0.2000 mg | ORAL_TABLET | Freq: Every day | ORAL | Status: DC
  Administered 2011-12-24 – 2011-12-25 (×2): 0.2 mg via ORAL
  Filled 2011-12-23 (×4): qty 1

## 2011-12-23 MED ORDER — OXYCODONE-ACETAMINOPHEN 5-325 MG PO TABS
1.0000 | ORAL_TABLET | ORAL | Status: DC | PRN
  Administered 2011-12-23 – 2011-12-24 (×2): 2 via ORAL
  Administered 2011-12-24 – 2011-12-25 (×2): 1 via ORAL
  Administered 2011-12-25: 2 via ORAL
  Administered 2011-12-25: 1 via ORAL
  Administered 2011-12-26 (×2): 2 via ORAL
  Filled 2011-12-23: qty 1
  Filled 2011-12-23 (×2): qty 2
  Filled 2011-12-23: qty 1
  Filled 2011-12-23: qty 2
  Filled 2011-12-23: qty 1
  Filled 2011-12-23: qty 2
  Filled 2011-12-23: qty 1
  Filled 2011-12-23: qty 2

## 2011-12-23 MED ORDER — LISINOPRIL 40 MG PO TABS
40.0000 mg | ORAL_TABLET | ORAL | Status: DC
  Administered 2011-12-25 – 2011-12-26 (×2): 40 mg via ORAL
  Filled 2011-12-23 (×5): qty 1

## 2011-12-23 MED ORDER — PANTOPRAZOLE SODIUM 40 MG PO TBEC
40.0000 mg | DELAYED_RELEASE_TABLET | Freq: Every day | ORAL | Status: DC
  Administered 2011-12-24 – 2011-12-26 (×3): 40 mg via ORAL
  Filled 2011-12-23 (×3): qty 1

## 2011-12-23 MED ORDER — POLYETHYLENE GLYCOL 3350 17 G PO PACK: 17.0000 g | PACK | Freq: Every day | ORAL | Status: DC | PRN

## 2011-12-23 MED ORDER — DIPHENHYDRAMINE HCL 12.5 MG/5ML PO ELIX: 12.5000 mg | ORAL_SOLUTION | ORAL | Status: DC | PRN

## 2011-12-23 MED ORDER — WARFARIN SODIUM 5 MG PO TABS
5.0000 mg | ORAL_TABLET | Freq: Once | ORAL | Status: AC
  Administered 2011-12-23: 5 mg via ORAL
  Filled 2011-12-23: qty 1

## 2011-12-23 MED ORDER — WARFARIN VIDEO
Freq: Once | Status: AC
  Administered 2011-12-23: 18:00:00

## 2011-12-23 MED ORDER — BUPROPION HCL ER (SR) 100 MG PO TB12
100.0000 mg | ORAL_TABLET | Freq: Every day | ORAL | Status: DC
  Administered 2011-12-24 – 2011-12-26 (×3): 100 mg via ORAL
  Filled 2011-12-23 (×4): qty 1

## 2011-12-23 MED ORDER — VITAMIN D3 25 MCG (1000 UNIT) PO TABS
2000.0000 [IU] | ORAL_TABLET | Freq: Every day | ORAL | Status: DC
  Administered 2011-12-23 – 2011-12-26 (×4): 2000 [IU] via ORAL
  Filled 2011-12-23 (×4): qty 2

## 2011-12-23 MED ORDER — PHENOL 1.4 % MT LIQD: 1.0000 | OROMUCOSAL | Status: DC | PRN

## 2011-12-23 MED ORDER — METHOCARBAMOL 100 MG/ML IJ SOLN
500.0000 mg | Freq: Four times a day (QID) | INTRAVENOUS | Status: DC | PRN
  Administered 2011-12-23: 500 mg via INTRAVENOUS
  Filled 2011-12-23: qty 5

## 2011-12-23 MED ORDER — WARFARIN - PHARMACIST DOSING INPATIENT: Freq: Every day | Status: DC

## 2011-12-23 MED ORDER — METOPROLOL SUCCINATE ER 100 MG PO TB24
100.0000 mg | ORAL_TABLET | ORAL | Status: DC
  Administered 2011-12-24 – 2011-12-26 (×3): 100 mg via ORAL
  Filled 2011-12-23 (×4): qty 1

## 2011-12-23 MED ORDER — FLEET ENEMA 7-19 GM/118ML RE ENEM: 1.0000 | ENEMA | Freq: Once | RECTAL | Status: AC | PRN

## 2011-12-23 MED ORDER — POTASSIUM CHLORIDE CRYS ER 20 MEQ PO TBCR
20.0000 meq | EXTENDED_RELEASE_TABLET | Freq: Two times a day (BID) | ORAL | Status: DC
  Administered 2011-12-23 – 2011-12-26 (×7): 20 meq via ORAL
  Filled 2011-12-23 (×8): qty 1

## 2011-12-23 MED ORDER — MENTHOL 3 MG MT LOZG: 1.0000 | LOZENGE | OROMUCOSAL | Status: DC | PRN

## 2011-12-23 MED ORDER — CITALOPRAM HYDROBROMIDE 40 MG PO TABS
40.0000 mg | ORAL_TABLET | ORAL | Status: DC
  Administered 2011-12-24 – 2011-12-26 (×3): 40 mg via ORAL
  Filled 2011-12-23 (×4): qty 1

## 2011-12-23 MED ORDER — KCL IN DEXTROSE-NACL 20-5-0.45 MEQ/L-%-% IV SOLN
INTRAVENOUS | Status: DC
  Administered 2011-12-23 – 2011-12-25 (×3): via INTRAVENOUS
  Filled 2011-12-23 (×12): qty 1000

## 2011-12-23 MED ORDER — ONDANSETRON HCL 4 MG/2ML IJ SOLN: 4.0000 mg | Freq: Four times a day (QID) | INTRAMUSCULAR | Status: DC | PRN

## 2011-12-23 MED ORDER — SODIUM CHLORIDE 0.9 % IR SOLN
Status: DC | PRN
  Administered 2011-12-23: 1000 mL

## 2011-12-23 MED ORDER — PROPOFOL 10 MG/ML IV EMUL
INTRAVENOUS | Status: DC | PRN
  Administered 2011-12-23: 200 mg via INTRAVENOUS
  Administered 2011-12-23: 50 mg via INTRAVENOUS

## 2011-12-23 MED ORDER — OXYCODONE HCL 5 MG PO TABS
5.0000 mg | ORAL_TABLET | ORAL | Status: DC | PRN
  Administered 2011-12-24 (×3): 10 mg via ORAL
  Administered 2011-12-24: 5 mg via ORAL
  Administered 2011-12-25 – 2011-12-26 (×2): 10 mg via ORAL
  Filled 2011-12-23 (×4): qty 2
  Filled 2011-12-23: qty 1
  Filled 2011-12-23: qty 2

## 2011-12-23 MED ORDER — ACETAMINOPHEN 325 MG PO TABS
650.0000 mg | ORAL_TABLET | Freq: Four times a day (QID) | ORAL | Status: DC | PRN
  Administered 2011-12-24 (×2): 650 mg via ORAL
  Filled 2011-12-23 (×2): qty 2

## 2011-12-23 MED ORDER — METHOCARBAMOL 500 MG PO TABS
500.0000 mg | ORAL_TABLET | Freq: Four times a day (QID) | ORAL | Status: DC | PRN
  Administered 2011-12-23 – 2011-12-26 (×7): 500 mg via ORAL
  Filled 2011-12-23 (×8): qty 1

## 2011-12-23 MED ORDER — ONDANSETRON HCL 4 MG PO TABS: 4.0000 mg | ORAL_TABLET | Freq: Four times a day (QID) | ORAL | Status: DC | PRN

## 2011-12-23 MED ORDER — FENTANYL CITRATE 0.05 MG/ML IJ SOLN
INTRAMUSCULAR | Status: DC | PRN
  Administered 2011-12-23 (×2): 50 ug via INTRAVENOUS
  Administered 2011-12-23: 150 ug via INTRAVENOUS
  Administered 2011-12-23: 100 ug via INTRAVENOUS

## 2011-12-23 MED ORDER — LACTATED RINGERS IV SOLN
INTRAVENOUS | Status: DC | PRN
  Administered 2011-12-23 (×2): via INTRAVENOUS

## 2011-12-23 MED ORDER — HYDROMORPHONE HCL PF 1 MG/ML IJ SOLN
0.2500 mg | INTRAMUSCULAR | Status: DC | PRN
  Administered 2011-12-23 (×2): 0.5 mg via INTRAVENOUS

## 2011-12-23 MED ORDER — TRIAMTERENE-HCTZ 37.5-25 MG PO TABS
2.0000 | ORAL_TABLET | Freq: Every day | ORAL | Status: DC
  Administered 2011-12-24: 1 via ORAL
  Administered 2011-12-25 – 2011-12-26 (×2): 2 via ORAL
  Filled 2011-12-23 (×4): qty 2

## 2011-12-23 MED ORDER — ALUM & MAG HYDROXIDE-SIMETH 200-200-20 MG/5ML PO SUSP: 30.0000 mL | ORAL | Status: DC | PRN

## 2011-12-23 SURGICAL SUPPLY — 56 items
BLADE SAW SAG 73X25 THK (BLADE) ×1
BLADE SAW SGTL 18X1.27X75 (BLADE) IMPLANT
BLADE SAW SGTL 73X25 THK (BLADE) ×1 IMPLANT
BLADE SAW SGTL MED 73X18.5 STR (BLADE) IMPLANT
BRUSH FEMORAL CANAL (MISCELLANEOUS) IMPLANT
CLOTH BEACON ORANGE TIMEOUT ST (SAFETY) ×2 IMPLANT
COVER BACK TABLE 24X17X13 BIG (DRAPES) IMPLANT
COVER SURGICAL LIGHT HANDLE (MISCELLANEOUS) ×2 IMPLANT
DRAPE ORTHO SPLIT 77X108 STRL (DRAPES) ×1
DRAPE PROXIMA HALF (DRAPES) ×2 IMPLANT
DRAPE SURG ORHT 6 SPLT 77X108 (DRAPES) ×1 IMPLANT
DRAPE U-SHAPE 47X51 STRL (DRAPES) ×2 IMPLANT
DRILL BIT 7/64X5 (BIT) ×2 IMPLANT
DRSG MEPILEX BORDER 4X12 (GAUZE/BANDAGES/DRESSINGS) ×2 IMPLANT
DRSG MEPILEX BORDER 4X8 (GAUZE/BANDAGES/DRESSINGS) IMPLANT
DURAPREP 26ML APPLICATOR (WOUND CARE) ×2 IMPLANT
ELECT BLADE 4.0 EZ CLEAN MEGAD (MISCELLANEOUS) ×2
ELECT REM PT RETURN 9FT ADLT (ELECTROSURGICAL) ×2
ELECTRODE BLDE 4.0 EZ CLN MEGD (MISCELLANEOUS) ×1 IMPLANT
ELECTRODE REM PT RTRN 9FT ADLT (ELECTROSURGICAL) ×1 IMPLANT
FLOSEAL 10ML (HEMOSTASIS) IMPLANT
GAUZE XEROFORM 1X8 LF (GAUZE/BANDAGES/DRESSINGS) IMPLANT
GLOVE BIO SURGEON STRL SZ7 (GLOVE) ×2 IMPLANT
GLOVE BIO SURGEON STRL SZ7.5 (GLOVE) ×2 IMPLANT
GLOVE BIOGEL PI IND STRL 7.0 (GLOVE) ×1 IMPLANT
GLOVE BIOGEL PI IND STRL 8 (GLOVE) ×1 IMPLANT
GLOVE BIOGEL PI INDICATOR 7.0 (GLOVE) ×1
GLOVE BIOGEL PI INDICATOR 8 (GLOVE) ×1
GOWN PREVENTION PLUS XLARGE (GOWN DISPOSABLE) ×4 IMPLANT
GOWN STRL NON-REIN LRG LVL3 (GOWN DISPOSABLE) ×4 IMPLANT
HANDPIECE INTERPULSE COAX TIP (DISPOSABLE)
HOOD PEEL AWAY FACE SHEILD DIS (HOOD) ×4 IMPLANT
KIT BASIN OR (CUSTOM PROCEDURE TRAY) ×2 IMPLANT
KIT ROOM TURNOVER OR (KITS) ×2 IMPLANT
MANIFOLD NEPTUNE II (INSTRUMENTS) ×2 IMPLANT
NEEDLE 22X1 1/2 (OR ONLY) (NEEDLE) ×2 IMPLANT
NS IRRIG 1000ML POUR BTL (IV SOLUTION) ×2 IMPLANT
PACK TOTAL JOINT (CUSTOM PROCEDURE TRAY) ×2 IMPLANT
PAD ARMBOARD 7.5X6 YLW CONV (MISCELLANEOUS) ×4 IMPLANT
PASSER SUT SWANSON 36MM LOOP (INSTRUMENTS) ×2 IMPLANT
PRESSURIZER FEMORAL UNIV (MISCELLANEOUS) IMPLANT
SET HNDPC FAN SPRY TIP SCT (DISPOSABLE) IMPLANT
SUT ETHIBOND 2 V 37 (SUTURE) ×2 IMPLANT
SUT ETHILON 3 0 FSL (SUTURE) ×2 IMPLANT
SUT VIC AB 0 CT1 27 (SUTURE) ×1
SUT VIC AB 0 CT1 27XBRD ANBCTR (SUTURE) ×1 IMPLANT
SUT VIC AB 0 CTB1 27 (SUTURE) ×2 IMPLANT
SUT VIC AB 1 CTX 36 (SUTURE) ×1
SUT VIC AB 1 CTX36XBRD ANBCTR (SUTURE) ×1 IMPLANT
SUT VIC AB 2-0 CTB1 (SUTURE) ×2 IMPLANT
SYR CONTROL 10ML LL (SYRINGE) ×2 IMPLANT
TOWEL OR 17X24 6PK STRL BLUE (TOWEL DISPOSABLE) ×2 IMPLANT
TOWEL OR 17X26 10 PK STRL BLUE (TOWEL DISPOSABLE) ×2 IMPLANT
TOWER CARTRIDGE SMART MIX (DISPOSABLE) IMPLANT
TRAY FOLEY CATH 14FR (SET/KITS/TRAYS/PACK) ×2 IMPLANT
WATER STERILE IRR 1000ML POUR (IV SOLUTION) ×4 IMPLANT

## 2011-12-23 NOTE — Transfer of Care (Signed)
Immediate Anesthesia Transfer of Care Note  Patient: Bailey [name]  Procedure(s) Performed: Procedure(s) (LRB): TOTAL HIP ARTHROPLASTY (Left)  Patient Location: PACU  Anesthesia Type: General  Level of Consciousness: awake, alert  and oriented  Airway & Oxygen Therapy: Patient Spontanous Breathing and Patient connected to nasal cannula oxygen  Post-op Assessment: Report given to PACU RN, Post -op Vital signs reviewed and stable and Patient moving all extremities  Post vital signs: Reviewed and stable  Complications: No apparent anesthesia complications

## 2011-12-23 NOTE — Preoperative (Signed)
Beta Blockers   Reason not to administer Beta Blockers:B blocker taken 12/23/11 @ 0615

## 2011-12-23 NOTE — Progress Notes (Signed)
ANTICOAGULATION CONSULT NOTE - Initial Consult  Pharmacy Consult for Coumadin Indication: VTE prophylaxis  Allergies  Allergen Reactions  . Codeine Nausea Only  . Sulfa Drugs Cross Reactors Other (See Comments)    Its been a long time ago    Patient Measurements:   Height = 62" Weight = 87.6 kg  Vital Signs: Temp: 97.5 F (36.4 C) (03/25 1115) Temp src: Oral (03/25 1115) BP: 112/76 mmHg (03/25 1115) Pulse Rate: 55  (03/25 1115)  Labs: from 3/18 SCr 1.1 Hgb 11.7 Hct 32.6 PLTC 258  Ordering baseline INR to be drawn today.   Medical History: Past Medical History  Diagnosis Date  . Hypertension     all BP meds. managed by Dr. Eula Listen    . Stroke     2005, R sided weakness   . Pneumonia     while at [facility].- 1990's told that she had pneumonia & was isolated   . GERD (gastroesophageal reflux disease)   . Arthritis     OA- R knee, L hip   . Depression   . Anxiety     "Breakdown" in the 1990's, hosp. for >1 wk. at [facility]    . Incontinence of urine     weak bladder    Medications:  Prescriptions prior to admission  Medication Sig Dispense Refill  . Ascorbic Acid (VITAMIN C PO) Take 1 tablet by mouth daily.        Marland Kitchen buPROPion (WELLBUTRIN SR) 100 MG 12 hr tablet Take 100 mg by mouth daily.       . Cholecalciferol (VITAMIN D3) 2000 UNITS capsule Take 2,000 Units by mouth daily.        . citalopram (CELEXA) 40 MG tablet Take 40 mg by mouth every morning.       . cloNIDine (CATAPRES) 0.2 MG tablet Take 0.2 mg by mouth at bedtime.       Marland Kitchen doxazosin (CARDURA) 4 MG tablet Take 4 mg by mouth at bedtime.       Marland Kitchen lisinopril (PRINIVIL,ZESTRIL) 20 MG tablet Take 40 mg by mouth every morning.       . meloxicam (MOBIC) 15 MG tablet Take 15 mg by mouth daily.      . metoprolol (TOPROL-XL) 100 MG 24 hr tablet Take 100 mg by mouth every morning.       Marland Kitchen omeprazole (PRILOSEC) 40 MG capsule Take 40 mg by mouth every morning.       Marland Kitchen oxyCODONE-acetaminophen (PERCOCET) 5-325  MG per tablet Take 1 tablet by mouth every 6 (six) hours as needed. For pain      . potassium chloride SA (K-DUR,KLOR-CON) 20 MEQ tablet Take 20 mEq by mouth 2 (two) times daily.       Marland Kitchen triamterene-hydrochlorothiazide (MAXZIDE-25) 37.5-25 MG per tablet Take 2 tablets by mouth daily.       Marland Kitchen aspirin 81 MG tablet Take 81 mg by mouth daily.        Assessment: 56 yo F s/p Left Total Hip Replacement.  Of note, pt is recently s/p R TKR and was on Coumadin for VTE px at that time as well.  Coumadin dosing that admission was reviewed.  Goal of Therapy:  INR 2-3   Plan:  INR stat and daily with AM labs. Coumadin 5 mg PO x 1 tonight. Coumadin book/video.  [employer], Pharm.D., BCPS Clinical Pharmacist Pager [phone] 12/23/2011 1:51 PM

## 2011-12-23 NOTE — Interval H&P Note (Signed)
History and Physical Interval Note:  12/23/2011 7:16 AM  Bailey [name]  has presented today for surgery, with the diagnosis of OSTEOARTHRITIS LEFT HIP  The various methods of treatment have been discussed with the patient and family. After consideration of risks, benefits and other options for treatment, the patient has consented to  Procedure(s) (LRB): TOTAL HIP ARTHROPLASTY (Left) as a surgical intervention .  The patients' history has been reviewed, patient examined, no change in status, stable for surgery.  I have reviewed the patients' chart and labs.  Questions were answered to the patient's satisfaction.     Nestor Lewandowsky

## 2011-12-23 NOTE — Plan of Care (Signed)
Problem: Consults Goal: Diagnosis- Total Joint Replacement Primary Total Hip Left     

## 2011-12-23 NOTE — Anesthesia Postprocedure Evaluation (Signed)
  Anesthesia Post-op Note  Patient: [name]  Procedure(s) Performed: Procedure(s) (LRB): TOTAL HIP ARTHROPLASTY (Left)  Patient Location: PACU  Anesthesia Type: General  Level of Consciousness: awake, alert  and oriented  Airway and Oxygen Therapy: Patient Spontanous Breathing and Patient connected to nasal cannula oxygen  Post-op Pain: mild  Post-op Assessment: Post-op Vital signs reviewed, Patient's Cardiovascular Status Stable, Respiratory Function Stable, Patent Airway, No signs of Nausea or vomiting and Pain level controlled  Post-op Vital Signs: Reviewed and stable  Complications: No apparent anesthesia complications

## 2011-12-23 NOTE — Op Note (Signed)
OPERATIVE REPORT    DATE OF PROCEDURE:  12/23/2011       PREOPERATIVE DIAGNOSIS:  OSTEOARTHRITIS LEFT HIP                                                          POSTOPERATIVE DIAGNOSIS:  OSTEOARTHRITIS LEFT HIP                                                           PROCEDURE:  L total hip arthroplasty using a 52 mm DePuy Pinnacle  Cup, [employer], 10-degree polyethylene liner index superior  and posterior, a +0 36 mm ceramic head, a [phone] SROM stem, 20Dsm Cone   SURGEON: Raheel Kunkle J    ASSISTANT:   Mauricia Area, PA-C  (present throughout entire procedure and necessary for timely completion of the procedure)   ANESTHESIA: General BLOOD LOSS: 500 FLUID REPLACEMENT: 1800 crystalloid DRAINS: Foley Catheter URINE OUTPUT: 300cc COMPLICATIONS: none    INDICATIONS FOR PROCEDURE: A 56 y.o. year-old With  OSTEOARTHRITIS LEFT HIP   for 5 years, x-rays show bone-on-bone arthritic changes. Despite conservative measures with observation, anti-inflammatory medicine, narcotics, use of a cane, has severe unremitting pain and can ambulate only a few blocks before resting.  Patient desires elective L total hip arthroplasty to decrease pain and increase function. The risks, benefits, and alternatives were discussed at length including but not limited to the risks of infection, bleeding, nerve injury, stiffness, blood clots, the need for revision surgery, cardiopulmonary complications, among others, and they were willing to proceed.y have been discussed. Questions answered.     PROCEDURE IN DETAIL: The patient was identified by armband,  received preoperative IV antibiotics in the holding area at [facility], taken to the operating room , appropriate anesthetic monitors  were attached and general endotracheal anesthesia induced. Foley catheter was inserted. He was rolled into the R lateral decubitus position and fixed there with a Stulberg Mark II pelvic clamp and the L  lower extremity was then prepped and draped  in the usual sterile fashion from the ankle to the hemipelvis. A time-out  procedure was performed. The skin along the lateral hip and thigh  infiltrated with 10 mL of 0.5% Marcaine and epinephrine solution. We  then made a posterolateral approach to the hip. With a #10 blade, 28 cm  incision through skin and subcutaneous tissue down to the level of the  IT band. Small bleeders were identified and cauterized. IT band cut in  line with skin incision exposing the greater trochanter. A Cobra retractor was placed between the gluteus minimus and the superior hip joint capsule, and a spiked Cobra between the quadratus femoris and the inferior hip joint capsule. This isolated the short  external rotators and piriformis tendons. These were tagged with a #2 Ethibond  suture and cut off their insertion on the intertrochanteric crest. The posterior  capsule was then developed into an acetabular-based flap from Posterior Superior off of the acetabulum out over the femoral neck and back posterior inferior to the acetabular rim. This flap was tagged with two #2 Ethibond sutures and retracted protecting  the sciatic nerve. This exposed the arthritic femoral head and osteophytes. The hip was then flexed and internally rotated, dislocating the femoral head and a standard neck cut performed 1 fingerbreadth above the lesser trochanter.  A spiked Cobra was placed in the cotyloid notch and a Hohmann retractor was then used to lever the femur anteriorly off of the anterior pelvic column. A posterior-inferior wing retractor was placed at the junction of the acetabulum and the ischium completing the acetabular exposure.We then removed the peripheral osteophytes and labrum from the acetabulum. We then reamed the acetabulum up to 51 mm with basket reamers obtaining good coverage in all quadrants, irrigated out with normal  saline solution and hammered into place a 52 mm pinnacle cup in  45  degrees of abduction and about 20 degrees of anteversion. More  peripheral osteophytes removed and a trial 10-degree liner placed with the  index superior-posterior. The hip was then flexed and internally rotated exposing the  proximal femur, which was entered with the initiating reamer followed by  the axial reamers up to a 15.5 mm full depth and 16mm partial depth. We then conically reamed to 20D to the correct depth for a 42 base neck. The calcar was milled to 20Dsm. A trial cone and stem was inserted in the 25 degrees anteversion, with a +0 36mm trial head. Trial reduction was then performed and excellent stability was noted with at 90 of flexion with 75 of internal rotation and then full extension with maximal external rotation. The hip could not be dislocated in full extension. The knee could easily flex  to about 130 degrees. We also stretched the abductors at this point,  because of the preexisting adductor contractures. All trial components  were then removed. The acetabulum was irrigated out with normal saline  solution. A titanium Apex [facility] was then screwed into place  followed by a 10-degree polyethylene liner index superior-posterior. On  the femoral side a 20Dsm ZTT1 cone was hammered into place, followed by a [id number] SROM stem in 25 degrees of anteversion. At this point, a +0 36 mm ceramic head was  hammered on the stem. The hip was reduced. We checked our stability  one more time and found to be excellent. The wound was once again  thoroughly irrigated out with normal saline solution pulse lavage. The  capsular flap and short external rotators were repaired back to the  intertrochanteric crest through drill holes with a #2 Ethibond suture.  The IT band was closed with running 1 Vicryl suture. The subcutaneous  tissue with 0 and 2-0 undyed Vicryl suture and the skin with running  interlocking 3-0 nylon suture. Dressing of Xeroform and Mepilex was  then applied.  The patient was then unclamped, rolled supine, awaken extubated and taken to recovery room without difficulty in stable condition.   Dalton Molesworth J 12/23/2011, 9:12 AM

## 2011-12-23 NOTE — Anesthesia Preprocedure Evaluation (Addendum)
Anesthesia Evaluation  Patient identified by MRN, date of birth, ID band Patient awake    Reviewed: Allergy & Precautions, H&P , NPO status , Patient's Chart, lab work & pertinent test results, reviewed documented beta blocker date and time   History of Anesthesia Complications Negative for: history of anesthetic complications  Airway Mallampati: I TM Distance: >3 FB Neck ROM: Full    Dental  (+) Upper Dentures, Partial Lower and Dental Advisory Given   Pulmonary neg pulmonary ROS,  breath sounds clear to auscultation  Pulmonary exam normal       Cardiovascular hypertension, Pt. on home beta blockers and Pt. on medications Rhythm:Regular Rate:Normal     Neuro/Psych PSYCHIATRIC DISORDERS Anxiety Depression CVA (R sided weakness), Residual Symptoms    GI/Hepatic Neg liver ROS, GERD-  Controlled,  Endo/Other  Morbid obesity  Renal/GU negative Renal ROS     Musculoskeletal  (+) Arthritis -, Osteoarthritis,    Abdominal (+) + obese,   Peds  Hematology Sickle cell trait- asymptomatic   Anesthesia Other Findings   Reproductive/Obstetrics                          Anesthesia Physical Anesthesia Plan  ASA: III  Anesthesia Plan: General   Post-op Pain Management:    Induction: Intravenous  Airway Management Planned: Oral ETT  Additional Equipment:   Intra-op Plan:   Post-operative Plan: Extubation in OR  Informed Consent: I have reviewed the patients History and Physical, chart, labs and discussed the procedure including the risks, benefits and alternatives for the proposed anesthesia with the patient or authorized representative who has indicated his/her understanding and acceptance.   Dental advisory given  Plan Discussed with: Surgeon and CRNA  Anesthesia Plan Comments: (Plan routine monitors, GETA)        Anesthesia Quick Evaluation

## 2011-12-23 NOTE — Anesthesia Procedure Notes (Signed)
Procedure Name: Intubation Date/Time: 12/23/2011 7:48 AM Performed by: Rossie Muskrat L Pre-anesthesia Checklist: Patient identified, Timeout performed, Emergency Drugs available, Suction available and Patient being monitored Patient Re-evaluated:Patient Re-evaluated prior to inductionOxygen Delivery Method: Circle system utilized Preoxygenation: Pre-oxygenation with 100% oxygen Intubation Type: IV induction Ventilation: Oral airway inserted - appropriate to patient size and Mask ventilation without difficulty Laryngoscope Size: Miller and 2 Grade View: Grade I Tube type: Oral Tube size: 7.5 mm Airway Equipment and Method: Stylet Placement Confirmation: ETT inserted through vocal cords under direct vision,  breath sounds checked- equal and bilateral and positive ETCO2 Secured at: 21 cm Tube secured with: Tape Dental Injury: Teeth and Oropharynx as per pre-operative assessment

## 2011-12-24 ENCOUNTER — Encounter (HOSPITAL_COMMUNITY): Payer: Self-pay | Admitting: Orthopedic Surgery

## 2011-12-24 LAB — CBC
MCH: 29.4 pg (ref 26.0–34.0)
MCHC: 35.5 g/dL (ref 30.0–36.0)
MCV: 82.9 fL (ref 78.0–100.0)
Platelets: 210 10*3/uL (ref 150–400)
RDW: 14.3 % (ref 11.5–15.5)

## 2011-12-24 LAB — PROTIME-INR: Prothrombin Time: 15.5 seconds — ABNORMAL HIGH (ref 11.6–15.2)

## 2011-12-24 LAB — BASIC METABOLIC PANEL
Calcium: 8.1 mg/dL — ABNORMAL LOW (ref 8.4–10.5)
Creatinine, Ser: 1.19 mg/dL — ABNORMAL HIGH (ref 0.50–1.10)
GFR calc Af Amer: 58 mL/min — ABNORMAL LOW (ref >90)
GFR calc non Af Amer: 50 mL/min — ABNORMAL LOW (ref >90)

## 2011-12-24 MED ORDER — WARFARIN SODIUM 5 MG PO TABS
5.0000 mg | ORAL_TABLET | Freq: Once | ORAL | Status: AC
  Administered 2011-12-24: 5 mg via ORAL
  Filled 2011-12-24: qty 1

## 2011-12-24 NOTE — Progress Notes (Signed)
PT Cancellation Note  Treatment cancelled today due to patient's refusal to participate x 2 attempts due to c/o pain, fatigue, nausea.  RN made aware.  Seng Fouts 12/24/2011, 2:24 PM

## 2011-12-24 NOTE — Progress Notes (Signed)
Patient ID: Bailey [name], female   DOB: [birth year], 56 y.o.   MRN: [id number] PATIENT ID: Bailey [name]  MRN: [id number]  DOB/AGE:  1956/06/14 / 56 y.o.  1 Day Post-Op Procedure(s) (LRB): TOTAL HIP ARTHROPLASTY (Left)    PROGRESS NOTE Subjective: Patient is alert, oriented,noNausea, no Vomiting, no passing gas, no Bowel Movement. Taking PO well. Denies SOB, Chest or Calf Pain. Using Incentive Spirometer, PAS in place. Ambulate WBAT Patient reports pain as 4 on 0-10 scale  .    Objective: Vital signs in last 24 hours: Filed Vitals:   12/23/11 1600 12/23/11 1721 12/23/11 2125 12/24/11 0156  BP: 96/63 97/56 112/62 98/63  Pulse: 63 63 65 75  Temp: 97.9 F (36.6 C) 98.3 F (36.8 C) 98.9 F (37.2 C) 100 F (37.8 C)  TempSrc: Oral Oral Oral   Resp: 18 18 20 18   SpO2: 99% 100% 100% 98%      Intake/Output from previous day: I/O last 3 completed shifts: In: 1920 [P.O.:720; I.V.:1200] Out: 350 [Urine:250; Blood:100]   Intake/Output this shift: Total I/O In: 1587.9 [P.O.:240; I.V.:1347.9] Out: 300 [Urine:300]   LABORATORY DATA:  Basename 12/24/11 0455 12/23/11 1505  WBC 5.3 --  HGB 9.3* --  HCT 26.2* --  PLT 210 --  NA -- --  K -- --  CL -- --  CO2 -- --  BUN -- --  CREATININE -- --  GLUCOSE -- --  GLUCAP -- --  INR 1.20 1.17  CALCIUM -- --    Examination: Neurologically intact ABD soft Neurovascular intact Sensation intact distally Intact pulses distally Dorsiflexion/Plantar flexion intact Incision: scant drainage No cellulitis present Compartment soft} XR AP&Lat of hip shows well placed\fixed THA  Assessment:   1 Day Post-Op Procedure(s) (LRB): TOTAL HIP ARTHROPLASTY (Left) ADDITIONAL DIAGNOSIS:    Plan: PT/OT WBAT, THA  posterior precautions  DVT Prophylaxis: SCDx72hr\Coumadin x 2weeks, monitor INR 1.5-2.0 target  DISCHARGE PLAN: Skilled Nursing Facility/Rehab [facility]  DISCHARGE NEEDS: HHPT, HHRN, CPM, Walker and 3-in-1 comode seat

## 2011-12-24 NOTE — Progress Notes (Addendum)
Occupational Therapy Evaluation Patient Details Name: Bailey [name] MRN: [id number] DOB: Mar 17, 1956 Today's Date: 12/24/2011  Problem List:  Patient Active Problem List  Diagnoses  . 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 Hip arthritis left    Past Medical History:  Past Medical History  Diagnosis Date  . Hypertension     all BP meds. managed by Dr. Eula Listen    . Stroke     2005, R sided weakness   . Pneumonia     while at [facility].- 1990's told that she had pneumonia & was isolated   . GERD (gastroesophageal reflux disease)   . Arthritis     OA- R knee, L hip   . Depression   . Anxiety     "Breakdown" in the 1990's, hosp. for >1 wk. at [facility]    . Incontinence of urine     weak bladder   Past Surgical History:  Past Surgical History  Procedure Date  . Thyroidectomy, partial   . Abdominal hysterectomy   . Tubal ligation   . Dilation and curettage of uterus     1980's  . Total knee arthroplasty 10/21/2011    Procedure: TOTAL KNEE ARTHROPLASTY;  Surgeon: Nestor Lewandowsky, MD;  Location: [facility] OR;  Service: Orthopedics;  Laterality: Right;  DEPUY SIGMA RP  . Joint replacement 10/2011    Rt knee  . Total hip arthroplasty 12/23/2011    Procedure: TOTAL HIP ARTHROPLASTY;  Surgeon: Nestor Lewandowsky, MD;  Location: [facility] OR;  Service: Orthopedics;  Laterality: Left;  DEPUY/PINNACLE CUPS/SROM STEM    OT Assessment/Plan/Recommendation OT Assessment Clinical Impression Statement: Pt s/p L THA. PT  will benefit from skilled OT to max indep with ADL and functional mobility for ADL secondary to the deficits listed below. PT will need to contiue rehab at a SNF secondary to living alone. OT Recommendation/Assessment: Patient will need skilled OT in the acute care venue OT Problem List: Decreased strength;Decreased range of motion;Decreased activity tolerance;Decreased knowledge of use of DME or AE;Decreased knowledge of precautions;Pain Barriers to Discharge: Decreased caregiver  support OT Therapy Diagnosis : Generalized weakness;Acute pain OT Plan OT Frequency: Min 1X/week OT Treatment/Interventions: Self-care/ADL training;DME and/or AE instruction;Therapeutic activities;Patient/family education OT Recommendation Follow Up Recommendations: Skilled nursing facility Equipment Recommended: Defer to next venue Individuals Consulted Consulted and Agree with Results and Recommendations: Patient OT Goals Acute Rehab OT Goals OT Goal Formulation: With patient Time For Goal Achievement: 7 days ADL Goals Pt Will Perform Lower Body Bathing: with min assist;with adaptive equipment;with cueing (comment type and amount);Sit to stand from chair ADL Goal: Lower Body Bathing - Progress: Goal set today Pt Will Perform Lower Body Dressing: with mod assist;Unsupported;with adaptive equipment ADL Goal: Lower Body Dressing - Progress: Goal set today Pt Will Transfer to Toilet: with DME;with supervision;3-in-1;Maintaining hip precautions ADL Goal: Toilet Transfer - Progress: Goal set today Pt Will Perform Toileting - Clothing Manipulation: Independently;Standing ADL Goal: Toileting - Clothing Manipulation - Progress: Goal set today Pt Will Perform Toileting - Hygiene: Independently;Sit to stand from 3-in-1/toilet ADL Goal: Toileting - Hygiene - Progress: Goal set today Additional ADL Goal #1: PT will demondtare 3/3 THP during ADL session. ADL Goal: Additional Goal #1 - Progress: Goal set today  OT Evaluation Precautions/Restrictions  Precautions Precautions: Posterior Hip Precaution Booklet Issued: Yes (comment) Required Braces or Orthoses: No Restrictions Weight Bearing Restrictions: No LLE Weight Bearing: Weight bearing as tolerated Prior Functioning Home Living Lives With: Alone Type of Home: Apartment Home  Layout: One level Home Access: Level entry Bathroom Shower/Tub: Engineer, manufacturing systems: Standard Bathroom Accessibility: Yes How Accessible:  Accessible via walker Home Adaptive Equipment: Walker - rolling Prior Function Level of Independence: Requires assistive device for independence (RW) Driving: Yes ADL ADL Eating/Feeding: Simulated;Independent Where Assessed - Eating/Feeding: Edge of bed Grooming: Simulated;Set up Where Assessed - Grooming: Sitting, bed Upper Body Bathing: Simulated;Set up Where Assessed - Upper Body Bathing: Supine, head of bed up Lower Body Bathing: Simulated;Maximal assistance Where Assessed - Lower Body Bathing: Sitting, bed Upper Body Dressing: Simulated;Set up Where Assessed - Upper Body Dressing: Sitting, bed Lower Body Dressing: +1 Total assistance Where Assessed - Lower Body Dressing: Sitting, bed;Sit to stand from bed Toilet Transfer: Not assessed (pt too painful today) Vision/Perception  Vision - History Baseline Vision: Wears glasses all the time Cognition Cognition Arousal/Alertness: Awake/alert Overall Cognitive Status: Appears within functional limits for tasks assessed Orientation Level: Oriented X4 Sensation/Coordination Sensation Light Touch: Appears Intact Proprioception: Appears Intact Coordination Gross Motor Movements are Fluid and Coordinated: Yes Fine Motor Movements are Fluid and Coordinated: Yes Extremity Assessment RUE Assessment RUE Assessment: Within Functional Limits LUE Assessment LUE Assessment: Within Functional Limits Mobility  Bed Mobility Bed Mobility: Yes Supine to Sit: 4: Min assist;With rails;HOB elevated (Comment degrees) Supine to Sit Details (indicate cue type and reason): assist for L LE, cues for sequencing.  Pt with increased anxiety, requires cues for each step of mobility and transfers Sitting - Scoot to [place] of Bed: 4: Min assist Sitting - Scoot to [place] of Bed Details (indicate cue type and reason): assist to hold L LE, cues for wt shifting technique Transfers Sit to Stand: 4: Min assist Sit to Stand Details (indicate cue type and  reason): cues for UE and LE placement Stand to Sit: 4: Min assist Stand to Sit Details: cues for UE and LE placement, assist to control descent   End of Session OT - End of Session Equipment Utilized During Treatment: Gait belt Activity Tolerance: Patient limited by fatigue;Patient limited by pain Patient left: in bed;with call bell in reach General Behavior During Session: [facility] for tasks performed Cognition: [facility] for tasks performed   [facility] 12/24/2011, 3:13 PM  [facility], OTR/L  [phone] 12/24/2011

## 2011-12-24 NOTE — Evaluation (Signed)
Physical Therapy Evaluation Patient Details Name: Bailey [name] MRN: [id number] DOB: [birth year] Today's Date: 12/24/2011  Problem List:  Patient Active Problem List  Diagnoses  . 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 Hip arthritis left    Past Medical History:  Past Medical History  Diagnosis Date  . Hypertension     all BP meds. managed by Dr. Eula Listen    . Stroke     2005, R sided weakness   . Pneumonia     while at [facility].- 1990's told that she had pneumonia & was isolated   . GERD (gastroesophageal reflux disease)   . Arthritis     OA- R knee, L hip   . Depression   . Anxiety     "Breakdown" in the 1990's, hosp. for >1 wk. at [facility]    . Incontinence of urine     weak bladder   Past Surgical History:  Past Surgical History  Procedure Date  . Thyroidectomy, partial   . Abdominal hysterectomy   . Tubal ligation   . Dilation and curettage of uterus     1980's  . Total knee arthroplasty 10/21/2011    Procedure: TOTAL KNEE ARTHROPLASTY;  Surgeon: Nestor Lewandowsky, MD;  Location: [facility] OR;  Service: Orthopedics;  Laterality: Right;  DEPUY SIGMA RP  . Joint replacement 10/2011    Rt knee    PT Assessment/Plan/Recommendation PT Assessment Clinical Impression Statement: Pt with decreased strength, gait, balance, mobility, activity tolerance and will benefit from skilled acute care PT to increase functional independence. PT Recommendation/Assessment: Patient will need skilled PT in the acute care venue PT Problem List: Decreased strength;Decreased mobility;Decreased activity tolerance;Decreased balance;Decreased knowledge of use of DME;Decreased knowledge of precautions PT Therapy Diagnosis : Difficulty walking;Generalized weakness;Acute pain PT Plan PT Frequency: 7X/week PT Treatment/Interventions: Functional mobility training;Balance training;Stair training;Therapeutic exercise;Gait training;DME instruction;Therapeutic activities;Patient/family education PT  Recommendation Follow Up Recommendations: Skilled nursing facility (pt states she plans to go to SNF) Equipment Recommended: Defer to next venue PT Goals  Acute Rehab PT Goals PT Goal Formulation: With patient Time For Goal Achievement: 7 days Pt will go Sit to Supine/Side: with modified independence Pt will Transfer Bed to Chair/Chair to Bed: with modified independence Pt will Ambulate: 51 - 150 feet;with modified independence Pt will Perform Home Exercise Program: Independently  PT Evaluation Precautions/Restrictions  Precautions Precautions: Posterior Hip Precaution Booklet Issued: Yes (comment) Required Braces or Orthoses: No Restrictions Weight Bearing Restrictions: No LLE Weight Bearing: Weight bearing as tolerated Prior Functioning  Home Living Lives With: Alone Type of Home: Apartment Home Layout: One level Home Access: Level entry Home Adaptive Equipment: Walker - rolling Prior Function Level of Independence: Requires assistive device for independence (RW) Driving: Yes Cognition Cognition Arousal/Alertness: Awake/alert Overall Cognitive Status: Appears within functional limits for tasks assessed Orientation Level: Oriented X4 Sensation/Coordination Sensation Light Touch: Appears Intact Proprioception: Appears Intact Coordination Gross Motor Movements are Fluid and Coordinated: Yes Extremity Assessment RLE Assessment RLE Assessment: Within Functional Limits LLE Assessment LLE Assessment:  (grossly 3-/5 limited by pain) Mobility (including Balance) Bed Mobility Supine to Sit: 4: Min assist;With rails;HOB elevated (Comment degrees) Supine to Sit Details (indicate cue type and reason): assist for L LE, cues for sequencing.  Pt with increased anxiety, requires cues for each step of mobility and transfers Sitting - Scoot to Edge of Bed: 4: Min assist Sitting - Scoot to Edge of Bed Details (indicate cue type and reason): assist to hold L LE, cues for  wt shifting  technique Transfers Transfers: Yes Sit to Stand: 4: Min assist Sit to Stand Details (indicate cue type and reason): cues for UE and LE placement Stand to Sit: 4: Min assist Stand to Sit Details: cues for UE and LE placement, assist to control descent Stand Pivot Transfers: 4: Min assist Stand Pivot Transfer Details (indicate cue type and reason): with RW, cues to not pivot on L LE but to take small steps to turn Ambulation/Gait Ambulation/Gait: Yes Ambulation/Gait Assistance: 4: Min assist Ambulation/Gait Assistance Details (indicate cue type and reason): decreased cadence, decreased stance on L, step to pattern Ambulation Distance (Feet): 30 Feet Assistive device: Rolling walker Gait velocity: slow Stairs: No  Posture/Postural Control Posture/Postural Control: No significant limitations Static Standing Balance Static Standing - Level of Assistance: 5: Stand by assistance (with B UE support) Dynamic Standing Balance Dynamic Standing - Level of Assistance: 4: Min assist (for functional task with UE support) Exercise    End of Session PT - End of Session Equipment Utilized During Treatment: Gait belt Activity Tolerance: Patient tolerated treatment well Patient left: in chair;with call bell in reach Nurse Communication: Mobility status for transfers;Mobility status for ambulation General Behavior During Session: [facility] for tasks performed Cognition: [facility] for tasks performed  [facility] 12/24/2011, 11:41 AM

## 2011-12-24 NOTE — Progress Notes (Signed)
UR COMPLETED  

## 2011-12-24 NOTE — Progress Notes (Signed)
Clinical Social Work Department BRIEF PSYCHOSOCIAL ASSESSMENT 12/24/2011  Patient:  Bailey [name], Bailey [name]     Account Number:  [id number]     Admit date:  12/23/2011  Clinical Social Worker:  Conley Simmonds  Date/Time:  12/24/2011 02:00 PM  Referred by:  Physician  Date Referred:  12/24/2011 Referred for  SNF Placement   Other Referral:   Interview type:  Patient Other interview type:    PSYCHOSOCIAL DATA Living Status:  ALONE Admitted from facility:   Level of care:   Primary support name:  Bailey [name] Primary support relationship to patient:  CHILD, ADULT Degree of support available:   Strong    CURRENT CONCERNS Current Concerns  Post-Acute Placement   Other Concerns:    SOCIAL WORK ASSESSMENT / PLAN CSW and pt discussed disposition options-CSW has initiated FL2 and will contact pt first choice for Rehab at d/c  FL2 in shadow chart for signature   Assessment/plan status:  Psychosocial Support/Ongoing Assessment of Needs Other assessment/ plan:   Information/referral to community resources:   [facility]    PATIENT'S/FAMILY'S RESPONSE TO PLAN OF CARE: Pt appreciative of CSW however experiencing heighteded levels of pain-Pt has [facility] to [facility] in the past and relayed this is her first choice at d/c-CSW will follow-   Bailey [name], [phone]

## 2011-12-24 NOTE — Progress Notes (Signed)
ANTICOAGULATION CONSULT NOTE - Follow Up Consult  Pharmacy Consult for Coumadin Indication: VTE prophylaxis  Allergies  Allergen Reactions  . Codeine Nausea Only  . Sulfa Drugs Cross Reactors Other (See Comments)    Its been a long time ago    Patient Measurements:   Height = 62"  Weight = 87.6 kg   Vital Signs: Temp: 98.2 F (36.8 C) (03/26 0813) Temp src: Oral (03/26 0813) BP: 119/63 mmHg (03/26 0813) Pulse Rate: 76  (03/26 0813)  Labs:  Basename 12/24/11 0455 12/23/11 1505  HGB 9.3* --  HCT 26.2* --  PLT 210 --  APTT -- --  LABPROT 15.5* 15.1  INR 1.20 1.17  HEPARINUNFRC -- --  CREATININE 1.19* --  CKTOTAL -- --  CKMB -- --  TROPONINI -- --   The CrCl is unknown because both a height and weight (above a minimum accepted value) are required for this calculation.   Medications:  Scheduled:    . buPROPion  100 mg Oral Daily  . cholecalciferol  2,000 Units Oral Daily  . citalopram  40 mg Oral BH-q7a  . cloNIDine  0.2 mg Oral QHS  . coumadin book   Does not apply Once  . doxazosin  4 mg Oral QHS  . lisinopril  40 mg Oral BH-q7a  . metoprolol succinate  100 mg Oral BH-q7a  . pantoprazole  40 mg Oral Q1200  . potassium chloride SA  20 mEq Oral BID  . triamterene-hydrochlorothiazide  2 each Oral Daily  . warfarin  5 mg Oral ONCE-1800  . warfarin   Does not apply Once  . Warfarin - Pharmacist Dosing Inpatient   Does not apply q1800  . DISCONTD: chlorhexidine  60 mL Topical Once  . DISCONTD: Vitamin D3  2,000 Units Oral Daily    Assessment: 56 yo F s/p Left Total Hip Replacement. INR subtherapeutic after first Coumadin dose (as expected).  Will repeat Coumadin 5mg  dose tonight.   Of note, pt is recently s/p R TKR and was on Coumadin for VTE px at that time as well. Coumadin dosing that admission was reviewed.  Goal of Therapy:  INR 1.5-2 (per MD)   Plan:  Coumadin 5mg  PO x 1 tonight. Continue daily INR.  [employer], Pharm.D., BCPS Clinical  Pharmacist Pager [phone] 12/24/2011 10:06 AM

## 2011-12-25 LAB — CBC
Hemoglobin: 8.9 g/dL — ABNORMAL LOW (ref 12.0–15.0)
MCH: 29.4 pg (ref 26.0–34.0)
MCHC: 35.9 g/dL (ref 30.0–36.0)
Platelets: 169 10*3/uL (ref 150–400)
RDW: 14.2 % (ref 11.5–15.5)

## 2011-12-25 LAB — PROTIME-INR: Prothrombin Time: 19.7 seconds — ABNORMAL HIGH (ref 11.6–15.2)

## 2011-12-25 MED ORDER — WARFARIN SODIUM 5 MG PO TABS
5.0000 mg | ORAL_TABLET | Freq: Every day | ORAL | Status: DC
  Administered 2011-12-25: 5 mg via ORAL
  Filled 2011-12-25 (×2): qty 1

## 2011-12-25 MED ORDER — CELECOXIB 200 MG PO CAPS
200.0000 mg | ORAL_CAPSULE | Freq: Two times a day (BID) | ORAL | Status: DC
  Administered 2011-12-25 – 2011-12-26 (×3): 200 mg via ORAL
  Filled 2011-12-25 (×4): qty 1

## 2011-12-25 NOTE — Progress Notes (Signed)
Physical Therapy Treatment Patient Details Name: Bailey [name] MRN: [id number] DOB: 1956/04/26 Today's Date: 12/25/2011  PT Assessment/Plan  PT - Assessment/Plan Comments on Treatment Session: Pt with much improved performance vs this morning.  Supervision level will gait and balance activities. PT Plan: Discharge plan remains appropriate;Frequency remains appropriate PT Frequency: 7X/week Follow Up Recommendations: Skilled nursing facility PT Goals  Acute Rehab PT Goals PT Goal: Sit to Supine/Side - Progress: Progressing toward goal PT Transfer Goal: Bed to Chair/Chair to Bed - Progress: Progressing toward goal PT Goal: Ambulate - Progress: Progressing toward goal  PT Treatment Precautions/Restrictions  Precautions Precautions: Posterior Hip Precaution Booklet Issued: Yes (comment) Required Braces or Orthoses: No Restrictions Weight Bearing Restrictions: No LLE Weight Bearing: Weight bearing as tolerated Mobility (including Balance) Bed Mobility Sit to Supine: 3: Mod assist Sit to Supine - Details (indicate cue type and reason): cues for technique, assist for both legs Transfers Sit to Stand: 5: Supervision;With armrests;From toilet;From chair/3-in-1 Sit to Stand Details (indicate cue type and reason): increased time, cues for LE placement Stand to Sit: 5: Supervision Ambulation/Gait Ambulation/Gait: Yes Ambulation/Gait Assistance: 5: Supervision Ambulation/Gait Assistance Details (indicate cue type and reason): with RW, decreased cadence, cues for step through pattern Ambulation Distance (Feet): 100 Feet (100' followed by 2 x 20') Assistive device: Rolling walker  Static Standing Balance Static Standing - Level of Assistance: 5: Stand by assistance (for functional toileting/hygiene task with 1 or 0 UE support) Exercise    End of Session PT - End of Session Equipment Utilized During Treatment: Gait belt Activity Tolerance: Patient tolerated treatment well Patient  left: in bed;with call bell in reach General Behavior During Session: [name] for tasks performed Cognition: [name] for tasks performed  Bailey [name] 12/25/2011, 3:27 PM

## 2011-12-25 NOTE — Progress Notes (Signed)
ANTICOAGULATION CONSULT NOTE - Follow Up Consult  Pharmacy Consult for Coumadin Indication: VTE prophylaxis  Allergies  Allergen Reactions  . Codeine Nausea Only  . Sulfa Drugs Cross Reactors Other (See Comments)    Its been a long time ago    Patient Measurements:   Height = 62"  Weight = 87.6 kg   Vital Signs: Temp: 100.3 F (37.9 C) (03/27 0555) BP: 146/55 mmHg (03/27 0555) Pulse Rate: 83  (03/27 0555)  Labs:  Basename 12/25/11 0500 12/24/11 0455 12/23/11 1505  HGB 8.9* 9.3* --  HCT 24.8* 26.2* --  PLT 169 210 --  APTT -- -- --  LABPROT 19.7* 15.5* 15.1  INR 1.64* 1.20 1.17  HEPARINUNFRC -- -- --  CREATININE -- 1.19* --  CKTOTAL -- -- --  CKMB -- -- --  TROPONINI -- -- --   The CrCl is unknown because both a height and weight (above a minimum accepted value) are required for this calculation.   Medications:  Scheduled:     . buPROPion  100 mg Oral Daily  . celecoxib  200 mg Oral BID  . cholecalciferol  2,000 Units Oral Daily  . citalopram  40 mg Oral BH-q7a  . cloNIDine  0.2 mg Oral QHS  . doxazosin  4 mg Oral QHS  . lisinopril  40 mg Oral BH-q7a  . metoprolol succinate  100 mg Oral BH-q7a  . pantoprazole  40 mg Oral Q1200  . potassium chloride SA  20 mEq Oral BID  . triamterene-hydrochlorothiazide  2 each Oral Daily  . warfarin  5 mg Oral ONCE-1800  . Warfarin - Pharmacist Dosing Inpatient   Does not apply q1800    Assessment: 56 yo F s/p Left Total Hip Replacement.   INR = 1.64  Of note, pt is recently s/p R TKR and was on Coumadin for VTE px at that time as well. Coumadin dosing that admission was reviewed.  Goal of Therapy:  INR 1.5-2 (per MD)   Plan:  Coumadin 5mg  PO daily Continue daily INR.  Okey Regal, PharmD Clinical Pharmacist [phone] 12/25/2011 10:31 AM

## 2011-12-25 NOTE — Progress Notes (Signed)
Occupational Therapy Treatment Patient Details Name: ASANTI CRAIGO MRN: [id number] DOB: [birth year] Today's Date: 12/25/2011  OT Assessment/Plan OT Assessment/Plan Comments on Treatment Session: Pt plans to D/C to [facility] tomorrow if able. Max cues for hip precautions. OT Plan: Discharge plan remains appropriate OT Frequency: Min 1X/week Follow Up Recommendations: Skilled nursing facility Equipment Recommended: Defer to next venue OT Goals Acute Rehab OT Goals OT Goal Formulation: With patient Time For Goal Achievement: 7 days ADL Goals Pt Will Perform Lower Body Bathing: with min assist;with adaptive equipment;with cueing (comment type and amount);Sit to stand from chair ADL Goal: Lower Body Bathing - Progress: Progressing toward goals Pt Will Perform Lower Body Dressing: with mod assist;Unsupported;with adaptive equipment ADL Goal: Lower Body Dressing - Progress: Progressing toward goals Pt Will Transfer to Toilet: with DME;with supervision;3-in-1;Maintaining hip precautions ADL Goal: Toilet Transfer - Progress: Progressing toward goals Pt Will Perform Toileting - Clothing Manipulation: Independently;Standing ADL Goal: Toileting - Clothing Manipulation - Progress: Progressing toward goals Pt Will Perform Toileting - Hygiene: Independently;Sit to stand from 3-in-1/toilet ADL Goal: Toileting - Hygiene - Progress: Progressing toward goals Additional ADL Goal #1: PT will demondtare 3/3 THP during ADL session. ADL Goal: Additional Goal #1 - Progress: Progressing toward goals  OT Treatment Precautions/Restrictions  Precautions Precautions: Posterior Hip Restrictions Weight Bearing Restrictions: No LLE Weight Bearing: Weight bearing as tolerated   ADL ADL ADL Comments: Demonstrated use of AE for ADL. Pt required max cues for precautions and stated that she did not know that she couldn't bend over to put on her underwear. Pt states that she will have her mother buy a hip kit prior  to D/C tomorrow. Treatment completed bedside as pt did not want to get out of bed again, although encouraged. Mobility  Bed Mobility Sit to Supine: 3: Mod assist Sit to Supine - Details (indicate cue type and reason): cues for technique, assist for both legs Transfers Sit to Stand: 5: Supervision;With armrests;From toilet;From chair/3-in-1 Sit to Stand Details (indicate cue type and reason): increased time, cues for LE placement Stand to Sit: 5: Supervision Exercises    End of Session OT - End of Session Equipment Utilized During Treatment: Other (comment) (AE) General Behavior During Session: [facility] for tasks performed Cognition: [facility] for tasks performed  [facility]  12/25/2011, 5:01 PM

## 2011-12-25 NOTE — Progress Notes (Signed)
Physical Therapy Treatment Patient Details Name: Bailey [name] MRN: [id number] DOB: [birth year] Today's Date: 12/25/2011  PT Assessment/Plan  PT - Assessment/Plan Comments on Treatment Session: Pt more groggy, drowsy today. Required increased time and assist with all mobility. PT Plan: Discharge plan remains appropriate;Frequency remains appropriate PT Frequency: 7X/week Follow Up Recommendations: Skilled nursing facility PT Goals  Acute Rehab PT Goals PT Goal: Sit to Supine/Side - Progress: Progressing toward goal PT Transfer Goal: Bed to Chair/Chair to Bed - Progress: Progressing toward goal PT Goal: Ambulate - Progress: Progressing toward goal PT Goal: Perform Home Exercise Program - Progress: Progressing toward goal  PT Treatment Precautions/Restrictions  Precautions Precautions: Posterior Hip Precaution Booklet Issued: Yes (comment) Required Braces or Orthoses: No Restrictions Weight Bearing Restrictions: No LLE Weight Bearing: Weight bearing as tolerated Mobility (including Balance) Bed Mobility Supine to Sit: 3: Mod assist;HOB elevated (Comment degrees);With rails Supine to Sit Details (indicate cue type and reason): assist for both LE's ? due to drowsiness  Sitting - Scoot to Edge of Bed: 3: Mod assist Sitting - Scoot to Edge of Bed Details (indicate cue type and reason): cues for sequencing, increased time required this morning vs eval Transfers Sit to Stand: 4: Min assist Sit to Stand Details (indicate cue type and reason): cues for hand placement Stand to Sit: 4: Min assist Stand to Sit Details: assist to control descent Stand Pivot Transfers: 4: Min assist Stand Pivot Transfer Details (indicate cue type and reason): with RW, steadying assist Ambulation/Gait Ambulation/Gait Assistance: 4: Min assist Ambulation/Gait Assistance Details (indicate cue type and reason): decreased wt bearing on L, step to pattern, pt with more delayed reactions and requires more time  this morning Assistive device: Rolling walker Gait velocity: slow  Static Standing Balance Static Standing - Level of Assistance: 4: Min assist (for functional hygiene tasks with 1 UE support) Exercise  Total Joint Exercises Ankle Circles/Pumps: 10 reps;Both;AROM Gluteal Sets: AROM;10 reps Short Arc Quad: AAROM;10 reps;Left Heel Slides: AAROM;10 reps;Left Hip ABduction/ADduction: AAROM;Left;10 reps End of Session PT - End of Session Equipment Utilized During Treatment: Gait belt Activity Tolerance: Patient limited by fatigue Patient left: in chair;with call bell in reach Nurse Communication: Mobility status for transfers General Behavior During Session: [facility] for tasks performed Cognition: [facility] for tasks performed  [facility] 12/25/2011, 8:56 AM

## 2011-12-25 NOTE — Progress Notes (Signed)
Patient ID: Bailey [name], female   DOB: January 23, 1956, 56 y.o.   MRN: [id number] PATIENT ID: Bailey [name]  MRN: [id number]  DOB/AGE:  1956/02/20 / 56 y.o.  2 Days Post-Op Procedure(s) (LRB): TOTAL HIP ARTHROPLASTY (Left)    PROGRESS NOTE Subjective: Patient is alert, oriented, 1+ Nausea, no Vomiting, yes passing gas, no Bowel Movement. Taking PO well. Denies SOB, Chest or Calf Pain. Using Incentive Spirometer, PAS in place. Ambulate WBAT Patient reports pain as 8 on 0-10 scale c/o pain in B knees, hips and Sdrs, hx of generalized DJD .    Objective: Vital signs in last 24 hours: Filed Vitals:   12/24/11 1155 12/24/11 1331 12/24/11 2128 12/25/11 0555  BP: 130/85 125/75 138/78 146/55  Pulse: 77 80 86 83  Temp: 99.6 F (37.6 C) 98.5 F (36.9 C) 101.3 F (38.5 C) 100.3 F (37.9 C)  TempSrc: Oral Oral    Resp: 14 18 20 18   SpO2: 92% 97% 97% 97%      Intake/Output from previous day: I/O last 3 completed shifts: In: 3857.9 [P.O.:1310; I.V.:2547.9] Out: 1200 [Urine:1100; Blood:100]   Intake/Output this shift: Total I/O In: 1238.3 [I.V.:1238.3] Out: -    LABORATORY DATA:  Basename 12/24/11 0455 12/23/11 1505  WBC 5.3 --  HGB 9.3* --  HCT 26.2* --  PLT 210 --  NA 138 --  K 4.0 --  CL 102 --  CO2 30 --  BUN 21 --  CREATININE 1.19* --  GLUCOSE 111* --  GLUCAP -- --  INR 1.20 1.17  CALCIUM 8.1* --    Examination: Neurologically intact ABD soft Neurovascular intact Sensation intact distally Intact pulses distally Dorsiflexion/Plantar flexion intact Incision: scant drainage No cellulitis present Compartment soft} XR AP&Lat of hip shows well placed\fixed THA  Assessment:   2 Days Post-Op Procedure(s) (LRB): TOTAL HIP ARTHROPLASTY (Left) ADDITIONAL DIAGNOSIS:  Generalized DJD  Plan: PT/OT WBAT, THA  posterior precautions Change dressing, celebrex 200mg  PO BID for DJD DVT Prophylaxis: SCDx72hr\Coumadin x 2weeks, monitor INR 1.5-2.0 target  DISCHARGE PLAN:  Skilled Nursing Facility/Rehab  DISCHARGE NEEDS: HHPT, HHRN, CPM, Walker and 3-in-1 comode seat

## 2011-12-25 NOTE — Progress Notes (Signed)
Clinical Social Work-CSW confirmed bed with [facility] which is pt first choice-[facility] able to accept pt any time this week-CSW will follow up with pt in am-Bailey [name] [place], [phone]

## 2011-12-26 LAB — CBC
Hemoglobin: 7.8 g/dL — ABNORMAL LOW (ref 12.0–15.0)
MCH: 29.5 pg (ref 26.0–34.0)
MCHC: 36.4 g/dL — ABNORMAL HIGH (ref 30.0–36.0)

## 2011-12-26 LAB — PROTIME-INR: Prothrombin Time: 23.8 seconds — ABNORMAL HIGH (ref 11.6–15.2)

## 2011-12-26 MED ORDER — WARFARIN SODIUM 5 MG PO TABS: ORAL_TABLET | ORAL | Status: DC

## 2011-12-26 MED ORDER — CELECOXIB 200 MG PO CAPS: 200.0000 mg | ORAL_CAPSULE | Freq: Two times a day (BID) | ORAL | Status: AC

## 2011-12-26 MED ORDER — OXYCODONE-ACETAMINOPHEN 5-325 MG PO TABS: 1.0000 | ORAL_TABLET | ORAL | Status: AC | PRN

## 2011-12-26 MED ORDER — METHOCARBAMOL 500 MG PO TABS: ORAL_TABLET | ORAL | Status: AC

## 2011-12-26 NOTE — Discharge Summary (Signed)
Patient ID: Bailey [name] MRN: [id number] DOB/AGE: [birth year] 56 y.o.  Admit date: 12/23/2011 Discharge date: 12/26/2011  Admission Diagnoses:  Principal Problem:  *Hip arthritis left   Discharge Diagnoses:  Same  Past Medical History  Diagnosis Date  . Hypertension     all BP meds. managed by Dr. Eula Listen    . Stroke     2005, R sided weakness   . Pneumonia     while at [facility] told that she had pneumonia & was isolated   . GERD (gastroesophageal reflux disease)   . Arthritis     OA- R knee, L hip   . Depression   . Anxiety     "Breakdown" in the 1990's, hosp. for >1 wk. at [facility]    . Incontinence of urine     weak bladder    Surgeries: Procedure(s): TOTAL HIP ARTHROPLASTY on 12/23/2011   Consultants:    Discharged Condition: Improved  Hospital Course: Bailey [name] is an 56 y.o. female who was admitted 12/23/2011 for operative treatment ofHip arthritis. Patient has severe unremitting pain that affects sleep, daily activities, and work/hobbies. After pre-op clearance the patient was taken to the operating room on 12/23/2011 and underwent  Procedure(s): TOTAL HIP ARTHROPLASTY.  Patient was full weightbearing with physical therapy the day after surgery.  Patient was given perioperative antibiotics: Anti-infectives     Start     Dose/Rate Route Frequency Ordered Stop   12/22/11 1009   ceFAZolin (ANCEF) IVPB 2 g/50 mL premix        2 g 100 mL/hr over 30 Minutes Intravenous 60 min pre-op 12/22/11 1009 12/23/11 0750           Patient was given sequential compression devices, early ambulation, and chemoprophylaxis to prevent DVT.  Patient benefited maximally from hospital stay and there were no complications.    Recent vital signs: Patient Vitals for the past 24 hrs:  BP Temp Pulse Resp SpO2  12/26/11 0623 140/86 mmHg 98.6 F (37 C) 80  18  99 %  01/21/2012 2221 136/81 mmHg 97.7 F (36.5 C) 81  18  98 %  Jan 21, 2012 2157 136/81 mmHg - - - -    2012-01-21 1300 114/73 mmHg 99.4 F (37.4 C) 81  18  98 %     Recent laboratory studies:  Basename 12/26/11 0540 21-Jan-2012 0500 12/24/11 0455  WBC 4.8 6.1 --  HGB 7.8* 8.9* --  HCT 21.4* 24.8* --  PLT 175 169 --  NA -- -- 138  K -- -- 4.0  CL -- -- 102  CO2 -- -- 30  BUN -- -- 21  CREATININE -- -- 1.19*  GLUCOSE -- -- 111*  INR 2.09* 1.64* --  CALCIUM -- -- 8.1*     Discharge Medications:   Medication List  As of 12/26/2011  6:59 AM   STOP taking these medications         aspirin 81 MG tablet      meloxicam 15 MG tablet         TAKE these medications         buPROPion 100 MG 12 hr tablet   Commonly known as: WELLBUTRIN SR   Take 100 mg by mouth daily.      celecoxib 200 MG capsule   Commonly known as: CELEBREX   Take 1 capsule (200 mg total) by mouth 2 (two) times daily.      citalopram 40 MG tablet   Commonly known as:  CELEXA   Take 40 mg by mouth every morning.      cloNIDine 0.2 MG tablet   Commonly known as: CATAPRES   Take 0.2 mg by mouth at bedtime.      doxazosin 4 MG tablet   Commonly known as: CARDURA   Take 4 mg by mouth at bedtime.      lisinopril 20 MG tablet   Commonly known as: PRINIVIL,ZESTRIL   Take 40 mg by mouth every morning.      methocarbamol 500 MG tablet   Commonly known as: ROBAXIN   1 tablet q 6hr prn muscle spasm      metoprolol succinate 100 MG 24 hr tablet   Commonly known as: TOPROL-XL   Take 100 mg by mouth every morning.      omeprazole 40 MG capsule   Commonly known as: PRILOSEC   Take 40 mg by mouth every morning.      oxyCODONE-acetaminophen 5-325 MG per tablet   Commonly known as: PERCOCET   Take 1 tablet by mouth every 4 (four) hours as needed for pain.      potassium chloride SA 20 MEQ tablet   Commonly known as: K-DUR,KLOR-CON   Take 20 mEq by mouth 2 (two) times daily.      triamterene-hydrochlorothiazide 37.5-25 MG per tablet   Commonly known as: MAXZIDE-25   Take 2 tablets by mouth daily.       VITAMIN C PO   Take 1 tablet by mouth daily.      Vitamin D3 2000 UNITS capsule   Take 2,000 Units by mouth daily.      warfarin 5 MG tablet   Commonly known as: COUMADIN   Take as directed by HHN. INR target 1.5-2.5. D/C after 2 weeks from date of surgery            Diagnostic Studies: Dg Pelvis Portable  12/23/2011  *RADIOLOGY REPORT*  Clinical Data: Postop left total hip arthroplasty.  PORTABLE PELVIS  Comparison: None  Findings: The left hip arthroplasty is in good position without complicating features.  The tip is not visualized on this examination.  The pubic symphysis and SI joints are intact.  IMPRESSION: Well seated components of a total left hip arthroplasty without complicating features.  Original Report Authenticated By: P. Loralie Champagne, M.D.   Dg Hip Portable 1 View Left  12/23/2011  *RADIOLOGY REPORT*  Clinical Data: Left total hip arthroplasty.  PORTABLE LEFT HIP - 1 VIEW  Comparison: Earlier pelvic film.  Findings: The prosthesis is well seated without complicating features.  Lucent line below the prosthesis tip has the appearance of a nutrient vessel.  IMPRESSION: Well seated prosthesis without complicating feature.  Original Report Authenticated By: P. Loralie Champagne, M.D.    Disposition: 03-Skilled Nursing Facility [facility] for one to 2 weeks  Discharge Orders    Future Orders Please Complete By Expires   Diet - low sodium heart healthy      Call MD / Call 911      Comments:   If you experience chest pain or shortness of breath, CALL 911 and be transported to the hospital emergency room.  If you develope a fever above 101 F, pus (white drainage) or increased drainage or redness at the wound, or calf pain, call your surgeon's office.   Constipation Prevention      Comments:   Drink plenty of fluids.  Prune juice may be helpful.  You may use a stool softener, such as  Colace (over the counter) 100 mg twice a day.  Use MiraLax (over the counter) for  constipation as needed.   Increase activity slowly as tolerated      Weight Bearing as taught in Physical Therapy      Comments:   Use a walker or crutches as instructed.   Patient may shower      Comments:   You may shower without a dressing once there is no drainage.  Do not wash over the wound.  If drainage remains, cover wound with plastic wrap and then shower.   Driving restrictions      Comments:   No driving for 2 weeks   Follow the hip precautions as taught in Physical Therapy      Change dressing      Comments:   You may change your dressing on POD #5      Follow-up Information    Schedule an appointment as soon as possible for a visit with Nestor Lewandowsky, MD. (As needed)    Contact information:   [facility] [address] [place] [address] [phone]           Signed: Nestor Lewandowsky 12/26/2011, 6:59 AM

## 2011-12-26 NOTE — Progress Notes (Signed)
Physical Therapy Treatment Note   12/26/11 0809  PT Visit Information  Last PT Received On 12/26/11  Precautions  Precautions Posterior Hip  Precaution Comments pt able to recall 2/3 hip prec, needed v/c's for no flex > 90  Restrictions  LLE Weight Bearing WBAT  Bed Mobility  Supine to Sit 4: Min assist  Supine to Sit Details (indicate cue type and reason) pt desired assist for L LE despite encouragement to trial L LE management on own, HOB 15 degrees  Sitting - Scoot to Edge of Bed 5: Supervision  Sitting - Scoot to [place] of Bed Details (indicate cue type and reason) increased time  Transfers  Sit to Stand 5: Supervision;From bed;From chair/3-in-1 (pt supervision with transfer on/off BSC)  Sit to Stand Details (indicate cue type and reason) increased time  Stand to Sit 5: Supervision  Stand to Sit Details increased time  Ambulation/Gait  Ambulation/Gait Assistance 5: Supervision  Ambulation/Gait Assistance Details (indicate cue type and reason) decreased cadence, antalgia, step-to gait pattern with decreased L LE WBing  Ambulation Distance (Feet) 35 Feet - pt deferred longer ambulation due to "I'm so sleepy and tired." despite max encouragement  Assistive device Rolling walker  Gait velocity slow  Wheelchair Mobility  Wheelchair Mobility No  Posture/Postural Control  Posture/Postural Control No significant limitations  Static Standing Balance  Static Standing - Level of Assistance 5: Stand by assistance  Static Standing - Balance Support No upper extremity supported  Static Standing - Comment/# of Minutes pt stood to perform hygiene s/p urination and to wash hands, able to adhere to post hip prec  PT - End of Session  Equipment Utilized During Treatment Gait belt  Activity Tolerance Patient tolerated treatment well  Patient left in chair;with call bell in reach Manufacturing engineer present to assist with bath)  Nurse Communication Mobility status for ambulation  General  Behavior During  Session [facility] for tasks performed (however with increased lethargy most likely from recent pain medication)  Cognition WFL for tasks performed  PT - Assessment/Plan  Comments on Treatment Session Patient with increased lethargy this date limiting ambulation distance despite max encouragement. RN with report that patient to be transferred to [facility] facility this PM. Pt improving with transfers and ambulation ability however limited by lethargy this date.  PT Plan Discharge plan remains appropriate;Frequency remains appropriate  PT Frequency 7X/week  Follow Up Recommendations Skilled nursing facility  Equipment Recommended Defer to next venue  Acute Rehab PT Goals  PT Goal: Sit to Supine/Side - Progress Progressing toward goal  PT Transfer Goal: Bed to Chair/Chair to Bed - Progress Progressing toward goal  PT Goal: Ambulate - Progress Progressing toward goal  PT Goal: Perform Home Exercise Program - Progress Progressing toward goal    Pain: 3/10 L hip surgical pain  Lewis Shock, PT, DPT Pager #: [phone] Office #: [phone]

## 2011-12-26 NOTE — Progress Notes (Signed)
Clinical Social Work-CSW facilitated pt d/c to [facility] with chart copy and PTAR transport-No further needs-Abdullah Rizzi-MSW, [phone]

## 2011-12-26 NOTE — Progress Notes (Signed)
CARE MANAGEMENT NOTE 12/26/2011  Patient:  Bailey [name], Bailey [name]   Account Number:  [id number]  Date Initiated:  12/26/2011  Documentation initiated by:  Vance Peper  Subjective/Objective Assessment:   56 yr old female s/p left total hip arthroplasty     Action/Plan:   Patient went to [facility] for shortterm rehab.   Anticipated DC Date:  12/26/2011   Anticipated DC Plan:  SKILLED NURSING FACILITY  In-house referral  Clinical Social Worker      DC Planning Services  CM consult      [facility] Choice  NA   Choice offered to / List presented to:  NA   DME arranged  NA      DME agency  NA     HH arranged  NA      Status of service:  Completed, signed off  Discharge Disposition:  SKILLED NURSING FACILITY

## 2012-01-20 ENCOUNTER — Other Ambulatory Visit: Payer: Self-pay | Admitting: Internal Medicine

## 2012-01-20 DIAGNOSIS — Z1231 Encounter for screening mammogram for malignant neoplasm of breast: Secondary | ICD-10-CM

## 2012-02-07 ENCOUNTER — Ambulatory Visit
Admission: RE | Admit: 2012-02-07 | Discharge: 2012-02-07 | Disposition: A | Discharge status: Home / Self Care | Payer: Medicare Other | Source: Ambulatory Visit | Attending: Internal Medicine | Admitting: Internal Medicine

## 2012-02-07 DIAGNOSIS — Z1231 Encounter for screening mammogram for malignant neoplasm of breast: Secondary | ICD-10-CM

## 2012-07-13 ENCOUNTER — Ambulatory Visit
Admission: RE | Admit: 2012-07-13 | Discharge: 2012-07-13 | Disposition: A | Discharge status: Home / Self Care | Payer: Medicare Other | Source: Ambulatory Visit | Attending: Internal Medicine | Admitting: Internal Medicine

## 2012-07-13 ENCOUNTER — Other Ambulatory Visit: Payer: Self-pay | Admitting: Internal Medicine

## 2012-07-13 DIAGNOSIS — R269 Unspecified abnormalities of gait and mobility: Secondary | ICD-10-CM

## 2012-07-13 DIAGNOSIS — R42 Dizziness and giddiness: Secondary | ICD-10-CM

## 2012-11-09 ENCOUNTER — Other Ambulatory Visit: Payer: Self-pay | Admitting: Internal Medicine

## 2012-11-09 DIAGNOSIS — I10 Essential (primary) hypertension: Secondary | ICD-10-CM

## 2012-11-16 ENCOUNTER — Ambulatory Visit
Admission: RE | Admit: 2012-11-16 | Discharge: 2012-11-16 | Disposition: A | Discharge status: Home / Self Care | Payer: Medicare Other | Source: Ambulatory Visit | Attending: Internal Medicine | Admitting: Internal Medicine

## 2012-11-16 DIAGNOSIS — I10 Essential (primary) hypertension: Secondary | ICD-10-CM

## 2013-03-23 ENCOUNTER — Other Ambulatory Visit: Payer: Self-pay

## 2013-03-23 DIAGNOSIS — Z1231 Encounter for screening mammogram for malignant neoplasm of breast: Secondary | ICD-10-CM

## 2013-04-07 ENCOUNTER — Ambulatory Visit
Admission: RE | Admit: 2013-04-07 | Discharge: 2013-04-07 | Disposition: A | Discharge status: Home / Self Care | Payer: Medicare Other | Source: Ambulatory Visit

## 2013-04-07 DIAGNOSIS — Z1231 Encounter for screening mammogram for malignant neoplasm of breast: Secondary | ICD-10-CM

## 2014-06-01 ENCOUNTER — Other Ambulatory Visit: Payer: Self-pay

## 2014-06-01 DIAGNOSIS — Z1231 Encounter for screening mammogram for malignant neoplasm of breast: Secondary | ICD-10-CM

## 2014-06-17 ENCOUNTER — Encounter (INDEPENDENT_AMBULATORY_CARE_PROVIDER_SITE_OTHER): Payer: Self-pay

## 2014-06-17 ENCOUNTER — Ambulatory Visit
Admission: RE | Admit: 2014-06-17 | Discharge: 2014-06-17 | Disposition: A | Discharge status: Home / Self Care | Payer: Commercial Managed Care - HMO | Source: Ambulatory Visit

## 2014-06-17 DIAGNOSIS — Z1231 Encounter for screening mammogram for malignant neoplasm of breast: Secondary | ICD-10-CM

## 2014-09-01 ENCOUNTER — Encounter (HOSPITAL_COMMUNITY): Payer: Self-pay | Admitting: Psychiatry

## 2014-09-01 ENCOUNTER — Ambulatory Visit (INDEPENDENT_AMBULATORY_CARE_PROVIDER_SITE_OTHER): Payer: 59 | Admitting: Psychiatry

## 2014-09-01 ENCOUNTER — Encounter (INDEPENDENT_AMBULATORY_CARE_PROVIDER_SITE_OTHER): Payer: Self-pay

## 2014-09-01 VITALS — BP 174/104 | HR 68 | Ht 60.0 in | Wt 194.2 lb

## 2014-09-01 DIAGNOSIS — F4310 Post-traumatic stress disorder, unspecified: Secondary | ICD-10-CM

## 2014-09-01 DIAGNOSIS — Z0000 Encounter for general adult medical examination without abnormal findings: Secondary | ICD-10-CM

## 2014-09-01 DIAGNOSIS — F333 Major depressive disorder, recurrent, severe with psychotic symptoms: Secondary | ICD-10-CM

## 2014-09-01 DIAGNOSIS — F411 Generalized anxiety disorder: Secondary | ICD-10-CM

## 2014-09-01 MED ORDER — QUETIAPINE FUMARATE 50 MG PO TABS: 50.0000 mg | ORAL_TABLET | Freq: Every day | ORAL | Status: AC

## 2014-09-01 NOTE — Patient Instructions (Signed)
Call to schedule EKG [phone]
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
## 2014-11-03 ENCOUNTER — Ambulatory Visit (HOSPITAL_COMMUNITY): Payer: Self-pay | Admitting: Psychiatry

## 2015-01-30 ENCOUNTER — Other Ambulatory Visit: Payer: Self-pay | Admitting: Orthopedic Surgery

## 2015-02-03 ENCOUNTER — Encounter (HOSPITAL_COMMUNITY): Payer: Self-pay | Admitting: Psychiatry

## 2015-02-03 ENCOUNTER — Encounter (HOSPITAL_COMMUNITY): Payer: Self-pay

## 2015-02-03 ENCOUNTER — Ambulatory Visit (HOSPITAL_COMMUNITY)
Admission: RE | Admit: 2015-02-03 | Discharge: 2015-02-03 | Disposition: A | Discharge status: Home / Self Care | Payer: Medicare Other | Source: Ambulatory Visit | Attending: Orthopedic Surgery | Admitting: Orthopedic Surgery

## 2015-02-03 ENCOUNTER — Encounter (HOSPITAL_COMMUNITY)
Admission: RE | Admit: 2015-02-03 | Discharge: 2015-02-03 | Disposition: A | Discharge status: Home / Self Care | Payer: Medicare Other | Source: Ambulatory Visit | Attending: Orthopedic Surgery | Admitting: Orthopedic Surgery

## 2015-02-03 DIAGNOSIS — Z8673 Personal history of transient ischemic attack (TIA), and cerebral infarction without residual deficits: Secondary | ICD-10-CM | POA: Diagnosis not present

## 2015-02-03 DIAGNOSIS — R001 Bradycardia, unspecified: Secondary | ICD-10-CM | POA: Diagnosis not present

## 2015-02-03 DIAGNOSIS — J45909 Unspecified asthma, uncomplicated: Secondary | ICD-10-CM | POA: Insufficient documentation

## 2015-02-03 DIAGNOSIS — Z79899 Other long term (current) drug therapy: Secondary | ICD-10-CM | POA: Diagnosis not present

## 2015-02-03 DIAGNOSIS — M1611 Unilateral primary osteoarthritis, right hip: Secondary | ICD-10-CM | POA: Diagnosis not present

## 2015-02-03 DIAGNOSIS — Z01818 Encounter for other preprocedural examination: Secondary | ICD-10-CM | POA: Diagnosis present

## 2015-02-03 DIAGNOSIS — Z01810 Encounter for preprocedural cardiovascular examination: Secondary | ICD-10-CM | POA: Insufficient documentation

## 2015-02-03 DIAGNOSIS — I10 Essential (primary) hypertension: Secondary | ICD-10-CM | POA: Insufficient documentation

## 2015-02-03 DIAGNOSIS — Z01812 Encounter for preprocedural laboratory examination: Secondary | ICD-10-CM | POA: Diagnosis not present

## 2015-02-03 DIAGNOSIS — Z0183 Encounter for blood typing: Secondary | ICD-10-CM | POA: Diagnosis not present

## 2015-02-03 DIAGNOSIS — K219 Gastro-esophageal reflux disease without esophagitis: Secondary | ICD-10-CM | POA: Insufficient documentation

## 2015-02-03 HISTORY — DX: Unspecified asthma, uncomplicated: J45.909

## 2015-02-03 HISTORY — DX: Constipation, unspecified: K59.00

## 2015-02-03 HISTORY — DX: Nontoxic goiter, unspecified: E04.9

## 2015-02-03 LAB — CBC WITH DIFFERENTIAL/PLATELET
BASOS ABS: 0 10*3/uL (ref 0.0–0.1)
Basophils Relative: 1 % (ref 0–1)
EOS ABS: 0.2 10*3/uL (ref 0.0–0.7)
EOS PCT: 5 % (ref 0–5)
HCT: 33.2 % — ABNORMAL LOW (ref 36.0–46.0)
Hemoglobin: 11.8 g/dL — ABNORMAL LOW (ref 12.0–15.0)
LYMPHS PCT: 53 % — AB (ref 12–46)
Lymphs Abs: 2.2 10*3/uL (ref 0.7–4.0)
MCH: 29.1 pg (ref 26.0–34.0)
MCHC: 35.5 g/dL (ref 30.0–36.0)
MCV: 81.8 fL (ref 78.0–100.0)
Monocytes Absolute: 0.4 10*3/uL (ref 0.1–1.0)
Monocytes Relative: 10 % (ref 3–12)
NEUTROS PCT: 31 % — AB (ref 43–77)
Neutro Abs: 1.3 10*3/uL — ABNORMAL LOW (ref 1.7–7.7)
PLATELETS: 204 10*3/uL (ref 150–400)
RBC: 4.06 MIL/uL (ref 3.87–5.11)
RDW: 13.8 % (ref 11.5–15.5)
WBC: 4.2 10*3/uL (ref 4.0–10.5)

## 2015-02-03 LAB — URINALYSIS, ROUTINE W REFLEX MICROSCOPIC
Bilirubin Urine: NEGATIVE
Glucose, UA: NEGATIVE mg/dL
HGB URINE DIPSTICK: NEGATIVE
KETONES UR: NEGATIVE mg/dL
LEUKOCYTES UA: NEGATIVE
NITRITE: NEGATIVE
Protein, ur: NEGATIVE mg/dL
SPECIFIC GRAVITY, URINE: 1.013 (ref 1.005–1.030)
Urobilinogen, UA: 1 mg/dL (ref 0.0–1.0)
pH: 7 (ref 5.0–8.0)

## 2015-02-03 LAB — PROTIME-INR
INR: 1.04 (ref 0.00–1.49)
Prothrombin Time: 13.7 seconds (ref 11.6–15.2)

## 2015-02-03 LAB — BASIC METABOLIC PANEL
Anion gap: 9 (ref 5–15)
BUN: 26 mg/dL — AB (ref 6–20)
CO2: 25 mmol/L (ref 22–32)
Calcium: 8.8 mg/dL — ABNORMAL LOW (ref 8.9–10.3)
Chloride: 102 mmol/L (ref 101–111)
Creatinine, Ser: 1.44 mg/dL — ABNORMAL HIGH (ref 0.44–1.00)
GFR, EST AFRICAN AMERICAN: 45 mL/min — AB (ref >60)
GFR, EST NON AFRICAN AMERICAN: 39 mL/min — AB (ref >60)
Glucose, Bld: 101 mg/dL — ABNORMAL HIGH (ref 70–99)
Potassium: 4 mmol/L (ref 3.5–5.1)
SODIUM: 136 mmol/L (ref 135–145)

## 2015-02-03 LAB — SURGICAL PCR SCREEN
MRSA, PCR: NEGATIVE
Staphylococcus aureus: NEGATIVE

## 2015-02-03 LAB — APTT: aPTT: 37 seconds (ref 24–37)

## 2015-02-03 NOTE — Pre-Procedure Instructions (Addendum)
Cheryll DessertMarguerite Faso  02/03/2015   Your procedure is scheduled on:  Wednesday, May 18  [facility] 10:45 AM.   Call this number if you have problems the morning of surgery: [phone]                For any other questions, please call [phone], Monday - Friday 8 AM - 4 PM.   Remember:   Do not eat food or drink liquids after midnight Tuesday, May 17   Take these medicines the morning of surgery with A SIP OF WATER:  Clonazepam, doxazosin (Cardura), Clonidine (Catapres), metoprolol succinate (TOPROL-XL) ,omeprazole (PRILOSEC)    Take if  needed pain medication.             On Wednesday, May 11 stop taking all Vitamins, Herbal products (fish oil), Aspirin, any medications counting NSAID's (Diclofenac)  Do not wear jewelry, make-up or nail polish.  Do not wear lotions, powders, or perfumes.   Do not shave 48 hours prior to surgery.   Do not bring valuables to the hospital.               [facility] is not [facility] for any belongings or valuables.               Contacts, dentures or bridgework may not be worn into surgery.  Leave suitcase in the car. After surgery it may be brought to your room.  For patients admitted to the hospital, discharge time is determined by your treatment team.               Special Instructions: Review  [place] - Preparing For Surgery.   Please read over the following fact sheets that you were given: Pain Booklet, Coughing and Deep Breathing, Blood Transfusion Information and Surgical Site Infection Prevention and Incentive Spirometry.

## 2015-02-03 NOTE — Progress Notes (Signed)
Ms Bailey [name] denies chest pain, shortness of breath.    PCP is Dr Eula ListenHussain,  I requested last office note , labs , EKG an cardiac studies.  I noticed that left lowe leg is swollen.   (Both right and left leg feel cool to touch )Patient has on Capri length pants).  Pulses 1 + in both feet.   Negative Homan's sign.  HR with VS 49.  Apical check 58 (patient drank coffee and transferred to bathroom from wheel chair and back).

## 2015-02-03 NOTE — Progress Notes (Signed)
Anesthesia consult:   Pt is 59 year old female scheduled for R total hip arthroplasty on 02/15/2015 with Dr. Turner Danielsowan.   PCP is Dr. Tyson DenseKarrar Hussain with [facility].   PMH includes: HTN, stroke (2005), asthma, GERD. Never smoker. BMI 36.   Medications include: clonidine, cardura, losartan, metoprolol, maxzide, seroquel, celexa, klonopin.   Preoperative labs reviewed.   Cr 1.44.   Chest x-Franko reviewed. No radiographic evidence of acute cardiopulmonary disease.   EKG: Sinus bradycardia (47 bpm). Possible Left atrial enlargement. LVH.   Called to see pt in PAT for lower extremity edema. B ankles with edema, L worse than R, minimally pitting. DP pulses intact B. No edema of lower leg/calf area noted. Negative Homan's sign B. No warmth or erythema. Most likely venous insufficiency. Suggested pt talk with PCP about this.   Sent copy of EKG and HR/BP from PAT to Dr. Eula ListenHussain for review. Pt is taking metoprolol 100mg  daily. Pt's bradycardia was asymptomatic at PAT.   Rica Mastngela Tamme Mozingo, FNP-BC [facility]Anesthesiology Phone: [phone] 02/03/2015 4:49 PM

## 2015-02-04 LAB — TYPE AND SCREEN
ABO/RH(D): A POS
Antibody Screen: NEGATIVE

## 2015-02-14 MED ORDER — CEFAZOLIN SODIUM-DEXTROSE 2-3 GM-% IV SOLR
2.0000 g | INTRAVENOUS | Status: AC
  Administered 2015-02-15: 2 g via INTRAVENOUS

## 2015-02-14 NOTE — H&P (Signed)
TOTAL HIP ADMISSION H&P  Patient is admitted for right total hip arthroplasty.  Subjective:  Chief Complaint: right hip pain  HPI: Bailey [name], 59 y.o. female, has a history of pain and functional disability in the right hip(s) due to arthritis and patient has failed non-surgical conservative treatments for greater than 12 weeks to include NSAID's and/or analgesics, corticosteriod injections, flexibility and strengthening excercises, weight reduction as appropriate and activity modification.  Onset of symptoms was gradual starting 1 years ago with gradually worsening course since that time.The patient noted no past surgery on the right hip(s).  Patient currently rates pain in the right hip at 10 out of 10 with activity. Patient has night pain, worsening of pain with activity and weight bearing, pain that interfers with activities of daily living and pain with passive range of motion. Patient has evidence of joint space narrowing by imaging studies. This condition presents safety issues increasing the risk of falls. There is no current active infection.  Patient Active Problem List   Diagnosis Date Noted  . Severe recurrent major depressive disorder with psychotic features 09/01/2014  . GAD (generalized anxiety disorder) 09/01/2014  . PTSD (post-traumatic stress disorder) 09/01/2014  . Hip arthritis left 12/23/2011  . 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 Hypertension     all BP meds. managed by Dr. Eula ListenHussain    . Stroke     2005, R sided weakness   . Pneumonia     while at [facility] told that she had pneumonia & was isolated   . Arthritis     OA- R knee, L hip   . Anxiety     "Breakdown" in the 1990's, hosp. for >1 wk. at [facility]    . Incontinence of urine     weak bladder  . Hypertension   . Stroke 2005    ? weakness in hand and arms per mother. patient unsure  . Asthma     years ago. "no problems in a  long while."  . Anxiety   . Depression   . GERD (gastroesophageal reflux disease)   . Constipation   . Goiter     removed  at age 59    Past Surgical History  Procedure Laterality Date  . Thyroidectomy, partial    . Tubal ligation    . Dilation and curettage of uterus      1980's  . Total knee arthroplasty  10/21/2011    Procedure: TOTAL KNEE ARTHROPLASTY;  Surgeon: Nestor LewandowskyFrank J Rowan, MD;  Location: [facility] OR;  Service: Orthopedics;  Laterality: Right;  DEPUY SIGMA RP  . Joint replacement  10/2011    Rt knee  . Total hip arthroplasty  12/23/2011    Procedure: TOTAL HIP ARTHROPLASTY;  Surgeon: Nestor LewandowskyFrank J Rowan, MD;  Location: [facility] OR;  Service: Orthopedics;  Laterality: Left;  DEPUY/PINNACLE CUPS/SROM STEM  . Colonoscopy    . Joint replacement Right     knee  . Total hip arthroplasty Left   . Abdominal hysterectomy    . Tonsillectomy    . Goiter      removed age 59    No prescriptions prior to admission   Allergies  Allergen Reactions  . Codeine Nausea Only  . Codeine Nausea Only    Upset stomach, headaches  . Sulfa Antibiotics Nausea Only    Upset stomach, headaches  . Sulfa Drugs Cross Reactors Other (See Comments)  Its been a long time ago    History  Substance Use Topics  . Smoking status: Never Smoker   . Smokeless tobacco: Not on file  . Alcohol Use: Yes     Comment: 2x's /month, socially      Family History  Problem Relation Age of Onset  . Anesthesia problems Neg Hx   . Hypotension Neg Hx   . Malignant hyperthermia Neg Hx   . Pseudochol deficiency Neg Hx   . Alcohol abuse Neg Hx   . Anxiety disorder Neg Hx   . Bipolar disorder Neg Hx   . Depression Neg Hx   . Dementia Neg Hx      Review of Systems  Constitutional: Positive for malaise/fatigue.  HENT: Negative.   Eyes: Negative.   Respiratory: Negative.   Cardiovascular: Negative.        HTN  Gastrointestinal: Negative.   Genitourinary: Negative.   Musculoskeletal: Positive for joint pain.  Skin:  Negative.   Neurological: Negative.   Endo/Heme/Allergies: Bruises/bleeds easily.  Psychiatric/Behavioral: Positive for depression. The patient is nervous/anxious.     Objective:  Physical Exam  Constitutional: She is oriented to person, place, and time. She appears well-developed and well-nourished.  HENT:  Head: Normocephalic and atraumatic.  Eyes: Pupils are equal, round, and reactive to light.  Neck: Normal range of motion. Neck supple.  Cardiovascular: Intact distal pulses.   Respiratory: Effort normal.  Musculoskeletal: She exhibits tenderness.  The patient has significant discomfort with internal rotation of the right hip to 30, foot tap is negative.  Minimally tender along the trochanteric bursal region.  Her skin is intact no cuts.  Scrapes or abrasions.  Her toes are pink and well perfused.  She does have some low level numbness diffusely to her feet.    Neurological: She is alert and oriented to person, place, and time.  Skin: Skin is warm and dry.  Psychiatric: She has a normal mood and affect. Her behavior is normal. Judgment and thought content normal.    Vital signs in last 24 hours:    Labs:   Estimated body mass index is 37.93 kg/(m^2) as calculated from the following:   Height as of 09/01/14: 5' (1.524 m).   Weight as of 09/01/14: 88.089 kg (194 lb 3.2 oz).   Imaging Review MRI scan consistent with advanced degenerative arthritis of the right hip which is probably a major contributor to her right lower extremity pain.  Assessment/Plan:  End stage arthritis, right hip(s)  The patient history, physical examination, clinical judgement of the provider and imaging studies are consistent with end stage degenerative joint disease of the right hip(s) and total hip arthroplasty is deemed medically necessary. The treatment options including medical management, injection therapy, arthroscopy and arthroplasty were discussed at length. The risks and benefits of total hip  arthroplasty were presented and reviewed. The risks due to aseptic loosening, infection, stiffness, dislocation/subluxation,  thromboembolic complications and other imponderables were discussed.  The patient acknowledged the explanation, agreed to proceed with the plan and consent was signed. Patient is being admitted for inpatient treatment for surgery, pain control, PT, OT, prophylactic antibiotics, VTE prophylaxis, progressive ambulation and ADL's and discharge planning.The patient is planning to be discharged home with home health services

## 2015-02-15 ENCOUNTER — Inpatient Hospital Stay (HOSPITAL_COMMUNITY): Payer: Medicare Other | Admitting: Vascular Surgery

## 2015-02-15 ENCOUNTER — Encounter (HOSPITAL_COMMUNITY): Payer: Self-pay | Admitting: *Deleted

## 2015-02-15 ENCOUNTER — Inpatient Hospital Stay (HOSPITAL_COMMUNITY)
Admission: RE | Admit: 2015-02-15 | Discharge: 2015-02-17 | DRG: 470 | Disposition: A | Discharge status: Skilled Nursing Facility | Payer: Medicare Other | Source: Ambulatory Visit | Attending: Orthopedic Surgery | Admitting: Orthopedic Surgery

## 2015-02-15 ENCOUNTER — Inpatient Hospital Stay (HOSPITAL_COMMUNITY): Payer: Medicare Other | Admitting: Anesthesiology

## 2015-02-15 ENCOUNTER — Encounter (HOSPITAL_COMMUNITY)
Admission: RE | Disposition: A | Discharge status: Skilled Nursing Facility | Payer: Self-pay | Source: Ambulatory Visit | Attending: Orthopedic Surgery

## 2015-02-15 ENCOUNTER — Inpatient Hospital Stay (HOSPITAL_COMMUNITY): Payer: Medicare Other

## 2015-02-15 DIAGNOSIS — Z8673 Personal history of transient ischemic attack (TIA), and cerebral infarction without residual deficits: Secondary | ICD-10-CM

## 2015-02-15 DIAGNOSIS — Z7982 Long term (current) use of aspirin: Secondary | ICD-10-CM

## 2015-02-15 DIAGNOSIS — F4310 Post-traumatic stress disorder, unspecified: Secondary | ICD-10-CM | POA: Diagnosis present

## 2015-02-15 DIAGNOSIS — M1611 Unilateral primary osteoarthritis, right hip: Principal | ICD-10-CM | POA: Diagnosis present

## 2015-02-15 DIAGNOSIS — I10 Essential (primary) hypertension: Secondary | ICD-10-CM | POA: Diagnosis present

## 2015-02-15 DIAGNOSIS — Z885 Allergy status to narcotic agent status: Secondary | ICD-10-CM

## 2015-02-15 DIAGNOSIS — D62 Acute posthemorrhagic anemia: Secondary | ICD-10-CM | POA: Diagnosis not present

## 2015-02-15 DIAGNOSIS — Z825 Family history of asthma and other chronic lower respiratory diseases: Secondary | ICD-10-CM

## 2015-02-15 DIAGNOSIS — Z79899 Other long term (current) drug therapy: Secondary | ICD-10-CM

## 2015-02-15 DIAGNOSIS — F411 Generalized anxiety disorder: Secondary | ICD-10-CM | POA: Diagnosis present

## 2015-02-15 DIAGNOSIS — F339 Major depressive disorder, recurrent, unspecified: Secondary | ICD-10-CM | POA: Diagnosis present

## 2015-02-15 DIAGNOSIS — Z96651 Presence of right artificial knee joint: Secondary | ICD-10-CM | POA: Diagnosis present

## 2015-02-15 DIAGNOSIS — K219 Gastro-esophageal reflux disease without esophagitis: Secondary | ICD-10-CM | POA: Diagnosis present

## 2015-02-15 DIAGNOSIS — Z96649 Presence of unspecified artificial hip joint: Secondary | ICD-10-CM

## 2015-02-15 DIAGNOSIS — Z96642 Presence of left artificial hip joint: Secondary | ICD-10-CM | POA: Diagnosis present

## 2015-02-15 DIAGNOSIS — Z882 Allergy status to sulfonamides status: Secondary | ICD-10-CM

## 2015-02-15 HISTORY — PX: TOTAL HIP ARTHROPLASTY: SHX124

## 2015-02-15 SURGERY — ARTHROPLASTY, HIP, TOTAL,POSTERIOR APPROACH
Anesthesia: Spinal | Site: Hip | Laterality: Right

## 2015-02-15 MED ORDER — OXYCODONE HCL 5 MG PO TABS
5.0000 mg | ORAL_TABLET | ORAL | Status: DC | PRN
  Administered 2015-02-15: 5 mg via ORAL
  Administered 2015-02-16 – 2015-02-17 (×5): 10 mg via ORAL
  Filled 2015-02-15 (×5): qty 2

## 2015-02-15 MED ORDER — PANTOPRAZOLE SODIUM 40 MG PO TBEC
80.0000 mg | DELAYED_RELEASE_TABLET | Freq: Every day | ORAL | Status: DC
  Administered 2015-02-16 – 2015-02-17 (×2): 80 mg via ORAL
  Filled 2015-02-15 (×2): qty 2

## 2015-02-15 MED ORDER — QUETIAPINE FUMARATE 25 MG PO TABS
25.0000 mg | ORAL_TABLET | Freq: Every day | ORAL | Status: DC
  Administered 2015-02-15 – 2015-02-16 (×2): 25 mg via ORAL
  Filled 2015-02-15 (×4): qty 1

## 2015-02-15 MED ORDER — ACETAMINOPHEN 650 MG RE SUPP: 650.0000 mg | Freq: Four times a day (QID) | RECTAL | Status: DC | PRN

## 2015-02-15 MED ORDER — LISINOPRIL 40 MG PO TABS
40.0000 mg | ORAL_TABLET | ORAL | Status: DC
  Administered 2015-02-16: 40 mg via ORAL
  Filled 2015-02-15: qty 1

## 2015-02-15 MED ORDER — CITALOPRAM HYDROBROMIDE 40 MG PO TABS
40.0000 mg | ORAL_TABLET | ORAL | Status: DC
  Administered 2015-02-16 – 2015-02-17 (×2): 40 mg via ORAL
  Filled 2015-02-15 (×2): qty 1

## 2015-02-15 MED ORDER — HYDROMORPHONE HCL 1 MG/ML IJ SOLN
0.2500 mg | INTRAMUSCULAR | Status: DC | PRN
  Administered 2015-02-15 (×4): 0.5 mg via INTRAVENOUS

## 2015-02-15 MED ORDER — DEXTROSE-NACL 5-0.45 % IV SOLN: INTRAVENOUS | Status: DC

## 2015-02-15 MED ORDER — TRIAMTERENE-HCTZ 37.5-25 MG PO TABS
2.0000 | ORAL_TABLET | Freq: Every day | ORAL | Status: DC
  Administered 2015-02-16 – 2015-02-17 (×2): 2 via ORAL
  Filled 2015-02-15 (×3): qty 2

## 2015-02-15 MED ORDER — PHENYLEPHRINE HCL 10 MG/ML IJ SOLN
INTRAMUSCULAR | Status: DC | PRN
  Administered 2015-02-15: 80 ug via INTRAVENOUS
  Administered 2015-02-15: 120 ug via INTRAVENOUS
  Administered 2015-02-15 (×4): 100 ug via INTRAVENOUS
  Administered 2015-02-15: 80 ug via INTRAVENOUS

## 2015-02-15 MED ORDER — METOCLOPRAMIDE HCL 5 MG/ML IJ SOLN: 5.0000 mg | Freq: Three times a day (TID) | INTRAMUSCULAR | Status: DC | PRN

## 2015-02-15 MED ORDER — ALUM & MAG HYDROXIDE-SIMETH 200-200-20 MG/5ML PO SUSP: 30.0000 mL | ORAL | Status: DC | PRN

## 2015-02-15 MED ORDER — PHENOL 1.4 % MT LIQD: 1.0000 | OROMUCOSAL | Status: DC | PRN

## 2015-02-15 MED ORDER — ASPIRIN EC 325 MG PO TBEC
325.0000 mg | DELAYED_RELEASE_TABLET | Freq: Every day | ORAL | Status: DC
  Administered 2015-02-17: 325 mg via ORAL
  Filled 2015-02-15 (×2): qty 1

## 2015-02-15 MED ORDER — ACETAMINOPHEN 325 MG PO TABS
650.0000 mg | ORAL_TABLET | Freq: Four times a day (QID) | ORAL | Status: DC | PRN
  Administered 2015-02-17: 650 mg via ORAL
  Filled 2015-02-15: qty 2

## 2015-02-15 MED ORDER — POTASSIUM CHLORIDE CRYS ER 20 MEQ PO TBCR
20.0000 meq | EXTENDED_RELEASE_TABLET | Freq: Two times a day (BID) | ORAL | Status: DC
  Administered 2015-02-16 – 2015-02-17 (×3): 20 meq via ORAL
  Filled 2015-02-15 (×3): qty 1

## 2015-02-15 MED ORDER — LACTATED RINGERS IV SOLN
INTRAVENOUS | Status: DC | PRN
  Administered 2015-02-15: 13:00:00 via INTRAVENOUS

## 2015-02-15 MED ORDER — CLONAZEPAM 0.5 MG PO TABS
0.5000 mg | ORAL_TABLET | Freq: Two times a day (BID) | ORAL | Status: DC
  Administered 2015-02-15 – 2015-02-17 (×4): 0.5 mg via ORAL
  Filled 2015-02-15 (×4): qty 1

## 2015-02-15 MED ORDER — METHOCARBAMOL 500 MG PO TABS
500.0000 mg | ORAL_TABLET | Freq: Four times a day (QID) | ORAL | Status: DC | PRN
  Administered 2015-02-15 – 2015-02-17 (×4): 500 mg via ORAL
  Filled 2015-02-15 (×4): qty 1

## 2015-02-15 MED ORDER — DEXAMETHASONE SODIUM PHOSPHATE 10 MG/ML IJ SOLN
10.0000 mg | Freq: Once | INTRAMUSCULAR | Status: AC
  Administered 2015-02-16: 10 mg via INTRAVENOUS
  Filled 2015-02-15: qty 1

## 2015-02-15 MED ORDER — HYDROMORPHONE HCL 1 MG/ML IJ SOLN
INTRAMUSCULAR | Status: AC
  Filled 2015-02-15: qty 1

## 2015-02-15 MED ORDER — SENNOSIDES-DOCUSATE SODIUM 8.6-50 MG PO TABS: 1.0000 | ORAL_TABLET | Freq: Every evening | ORAL | Status: DC | PRN

## 2015-02-15 MED ORDER — ALBUMIN HUMAN 5 % IV SOLN
INTRAVENOUS | Status: DC | PRN
  Administered 2015-02-15 (×2): via INTRAVENOUS

## 2015-02-15 MED ORDER — METOCLOPRAMIDE HCL 5 MG PO TABS: 5.0000 mg | ORAL_TABLET | Freq: Three times a day (TID) | ORAL | Status: DC | PRN

## 2015-02-15 MED ORDER — ONDANSETRON HCL 4 MG/2ML IJ SOLN: 4.0000 mg | Freq: Four times a day (QID) | INTRAMUSCULAR | Status: DC | PRN

## 2015-02-15 MED ORDER — BISACODYL 5 MG PO TBEC: 5.0000 mg | DELAYED_RELEASE_TABLET | Freq: Every day | ORAL | Status: DC | PRN

## 2015-02-15 MED ORDER — OXYCODONE HCL 5 MG PO TABS
ORAL_TABLET | ORAL | Status: AC
  Filled 2015-02-15: qty 1

## 2015-02-15 MED ORDER — DIPHENHYDRAMINE HCL 12.5 MG/5ML PO ELIX
12.5000 mg | ORAL_SOLUTION | ORAL | Status: DC | PRN
Start: 2015-02-15 — End: 2015-02-17

## 2015-02-15 MED ORDER — METOPROLOL SUCCINATE ER 100 MG PO TB24
100.0000 mg | ORAL_TABLET | ORAL | Status: DC
  Administered 2015-02-16 – 2015-02-17 (×2): 100 mg via ORAL
  Filled 2015-02-15 (×2): qty 1

## 2015-02-15 MED ORDER — LACTATED RINGERS IV SOLN
INTRAVENOUS | Status: DC
  Administered 2015-02-15 (×2): via INTRAVENOUS

## 2015-02-15 MED ORDER — HYDROMORPHONE HCL 1 MG/ML IJ SOLN
0.5000 mg | INTRAMUSCULAR | Status: DC | PRN
  Administered 2015-02-15 – 2015-02-17 (×6): 1 mg via INTRAVENOUS
  Filled 2015-02-15 (×6): qty 1

## 2015-02-15 MED ORDER — METHOCARBAMOL 500 MG PO TABS
ORAL_TABLET | ORAL | Status: AC
  Filled 2015-02-15: qty 1

## 2015-02-15 MED ORDER — GLYCOPYRROLATE 0.2 MG/ML IJ SOLN
INTRAMUSCULAR | Status: DC | PRN
  Administered 2015-02-15: 0.2 mg via INTRAVENOUS

## 2015-02-15 MED ORDER — METHOCARBAMOL 1000 MG/10ML IJ SOLN
500.0000 mg | Freq: Four times a day (QID) | INTRAVENOUS | Status: DC | PRN
  Filled 2015-02-15: qty 5

## 2015-02-15 MED ORDER — MENTHOL 3 MG MT LOZG: 1.0000 | LOZENGE | OROMUCOSAL | Status: DC | PRN

## 2015-02-15 MED ORDER — MIDAZOLAM HCL 5 MG/5ML IJ SOLN
INTRAMUSCULAR | Status: DC | PRN
  Administered 2015-02-15: 2 mg via INTRAVENOUS

## 2015-02-15 MED ORDER — DOXAZOSIN MESYLATE 8 MG PO TABS
8.0000 mg | ORAL_TABLET | Freq: Every day | ORAL | Status: DC
  Administered 2015-02-15 – 2015-02-16 (×2): 8 mg via ORAL
  Filled 2015-02-15 (×4): qty 1

## 2015-02-15 MED ORDER — BUPIVACAINE-EPINEPHRINE 0.5% -1:200000 IJ SOLN
INTRAMUSCULAR | Status: DC | PRN
  Administered 2015-02-15: 20 mL

## 2015-02-15 MED ORDER — KCL IN DEXTROSE-NACL 20-5-0.45 MEQ/L-%-% IV SOLN
INTRAVENOUS | Status: DC
  Administered 2015-02-16: 04:00:00 via INTRAVENOUS
  Filled 2015-02-15 (×7): qty 1000

## 2015-02-15 MED ORDER — FENTANYL CITRATE (PF) 100 MCG/2ML IJ SOLN
INTRAMUSCULAR | Status: DC | PRN
  Administered 2015-02-15 (×2): 50 ug via INTRAVENOUS

## 2015-02-15 MED ORDER — CEFUROXIME SODIUM 1.5 G IJ SOLR
INTRAMUSCULAR | Status: AC
  Filled 2015-02-15: qty 1.5

## 2015-02-15 MED ORDER — DOCUSATE SODIUM 100 MG PO CAPS
100.0000 mg | ORAL_CAPSULE | Freq: Two times a day (BID) | ORAL | Status: DC
  Administered 2015-02-15 – 2015-02-17 (×4): 100 mg via ORAL
  Filled 2015-02-15 (×4): qty 1

## 2015-02-15 MED ORDER — BUPIVACAINE HCL (PF) 0.5 % IJ SOLN
INTRAMUSCULAR | Status: DC | PRN
  Administered 2015-02-15: 12 mg via INTRATHECAL

## 2015-02-15 MED ORDER — CEFAZOLIN SODIUM-DEXTROSE 2-3 GM-% IV SOLR
INTRAVENOUS | Status: AC
  Filled 2015-02-15: qty 50

## 2015-02-15 MED ORDER — LOSARTAN POTASSIUM 50 MG PO TABS
100.0000 mg | ORAL_TABLET | Freq: Every day | ORAL | Status: DC
  Administered 2015-02-16 – 2015-02-17 (×2): 100 mg via ORAL
  Filled 2015-02-15 (×2): qty 2

## 2015-02-15 MED ORDER — FLEET ENEMA 7-19 GM/118ML RE ENEM: 1.0000 | ENEMA | Freq: Once | RECTAL | Status: AC | PRN

## 2015-02-15 MED ORDER — CHLORHEXIDINE GLUCONATE 4 % EX LIQD
60.0000 mL | Freq: Once | CUTANEOUS | Status: DC
  Filled 2015-02-15: qty 60

## 2015-02-15 MED ORDER — SODIUM CHLORIDE 0.9 % IR SOLN
Status: DC | PRN
  Administered 2015-02-15: 1000 mL

## 2015-02-15 MED ORDER — ONDANSETRON HCL 4 MG PO TABS: 4.0000 mg | ORAL_TABLET | Freq: Four times a day (QID) | ORAL | Status: DC | PRN

## 2015-02-15 MED ORDER — TRANEXAMIC ACID 1000 MG/10ML IV SOLN
2000.0000 mg | Freq: Once | INTRAVENOUS | Status: DC
  Filled 2015-02-15: qty 20

## 2015-02-15 MED ORDER — TRANEXAMIC ACID 1000 MG/10ML IV SOLN
2000.0000 mg | INTRAVENOUS | Status: AC
  Administered 2015-02-15: 2000 mg via TOPICAL
  Filled 2015-02-15: qty 20

## 2015-02-15 MED ORDER — TETRAHYDROZOLINE HCL 0.05 % OP SOLN: 1.0000 [drp] | Freq: Every day | OPHTHALMIC | Status: DC | PRN

## 2015-02-15 MED ORDER — CLONIDINE HCL 0.2 MG PO TABS
0.3000 mg | ORAL_TABLET | Freq: Two times a day (BID) | ORAL | Status: DC
  Administered 2015-02-16 – 2015-02-17 (×2): 0.3 mg via ORAL
  Filled 2015-02-15 (×4): qty 1

## 2015-02-15 MED ORDER — EPHEDRINE SULFATE 50 MG/ML IJ SOLN
INTRAMUSCULAR | Status: DC | PRN
  Administered 2015-02-15 (×4): 10 mg via INTRAVENOUS

## 2015-02-15 SURGICAL SUPPLY — 59 items
BLADE SAW SGTL 18X1.27X75 (BLADE) ×2 IMPLANT
BLADE SAW SGTL 18X1.27X75MM (BLADE) ×1
BRUSH FEMORAL CANAL (MISCELLANEOUS) IMPLANT
CAPT HIP TOTAL 2 ×3 IMPLANT
COVER BACK TABLE 24X17X13 BIG (DRAPES) IMPLANT
COVER SURGICAL LIGHT HANDLE (MISCELLANEOUS) ×3 IMPLANT
DRAPE IMP U-DRAPE 54X76 (DRAPES) ×3 IMPLANT
DRAPE ORTHO SPLIT 77X108 STRL (DRAPES) ×2
DRAPE PROXIMA HALF (DRAPES) ×3 IMPLANT
DRAPE SURG ORHT 6 SPLT 77X108 (DRAPES) ×1 IMPLANT
DRAPE U-SHAPE 47X51 STRL (DRAPES) ×3 IMPLANT
DRILL BIT 7/64X5 (BIT) ×3 IMPLANT
DRSG AQUACEL AG ADV 3.5X10 (GAUZE/BANDAGES/DRESSINGS) IMPLANT
DRSG AQUACEL AG ADV 3.5X14 (GAUZE/BANDAGES/DRESSINGS) ×6 IMPLANT
DURAPREP 26ML APPLICATOR (WOUND CARE) ×6 IMPLANT
ELECT BLADE 4.0 EZ CLEAN MEGAD (MISCELLANEOUS)
ELECT REM PT RETURN 9FT ADLT (ELECTROSURGICAL) ×3
ELECTRODE BLDE 4.0 EZ CLN MEGD (MISCELLANEOUS) IMPLANT
ELECTRODE REM PT RTRN 9FT ADLT (ELECTROSURGICAL) ×1 IMPLANT
GLOVE BIO SURGEON STRL SZ7.5 (GLOVE) ×6 IMPLANT
GLOVE BIO SURGEON STRL SZ8.5 (GLOVE) ×6 IMPLANT
GLOVE BIOGEL PI IND STRL 8 (GLOVE) ×1 IMPLANT
GLOVE BIOGEL PI IND STRL 9 (GLOVE) ×1 IMPLANT
GLOVE BIOGEL PI INDICATOR 8 (GLOVE) ×2
GLOVE BIOGEL PI INDICATOR 9 (GLOVE) ×2
GOWN STRL REUS W/ TWL LRG LVL3 (GOWN DISPOSABLE) ×3 IMPLANT
GOWN STRL REUS W/ TWL XL LVL3 (GOWN DISPOSABLE) ×2 IMPLANT
GOWN STRL REUS W/TWL LRG LVL3 (GOWN DISPOSABLE) ×6
GOWN STRL REUS W/TWL XL LVL3 (GOWN DISPOSABLE) ×4
HANDPIECE INTERPULSE COAX TIP (DISPOSABLE)
HOOD PEEL AWAY FACE SHEILD DIS (HOOD) ×6 IMPLANT
KIT BASIN OR (CUSTOM PROCEDURE TRAY) ×3 IMPLANT
KIT ROOM TURNOVER OR (KITS) ×3 IMPLANT
MANIFOLD NEPTUNE II (INSTRUMENTS) ×3 IMPLANT
NEEDLE 22X1 1/2 (OR ONLY) (NEEDLE) ×3 IMPLANT
NS IRRIG 1000ML POUR BTL (IV SOLUTION) ×3 IMPLANT
PACK TOTAL JOINT (CUSTOM PROCEDURE TRAY) ×3 IMPLANT
PACK UNIVERSAL I (CUSTOM PROCEDURE TRAY) ×3 IMPLANT
PAD ARMBOARD 7.5X6 YLW CONV (MISCELLANEOUS) ×6 IMPLANT
PASSER SUT SWANSON 36MM LOOP (INSTRUMENTS) ×3 IMPLANT
PRESSURIZER FEMORAL UNIV (MISCELLANEOUS) IMPLANT
SET HNDPC FAN SPRY TIP SCT (DISPOSABLE) IMPLANT
SUT ETHIBOND 2 V 37 (SUTURE) ×3 IMPLANT
SUT VIC AB 0 CT1 27 (SUTURE) ×2
SUT VIC AB 0 CT1 27XBRD ANBCTR (SUTURE) ×1 IMPLANT
SUT VIC AB 0 CTB1 27 (SUTURE) ×3 IMPLANT
SUT VIC AB 1 CTX 36 (SUTURE) ×2
SUT VIC AB 1 CTX36XBRD ANBCTR (SUTURE) ×1 IMPLANT
SUT VIC AB 2-0 CTB1 (SUTURE) ×3 IMPLANT
SUT VIC AB 3-0 SH 27 (SUTURE) ×4
SUT VIC AB 3-0 SH 27X BRD (SUTURE) ×2 IMPLANT
SYR CONTROL 10ML LL (SYRINGE) ×3 IMPLANT
TOWEL OR 17X24 6PK STRL BLUE (TOWEL DISPOSABLE) ×3 IMPLANT
TOWEL OR 17X26 10 PK STRL BLUE (TOWEL DISPOSABLE) ×3 IMPLANT
TOWER CARTRIDGE SMART MIX (DISPOSABLE) IMPLANT
TRAY CATH 16FR W/PLASTIC CATH (SET/KITS/TRAYS/PACK) ×3 IMPLANT
TRAY FOLEY CATH 14FR (SET/KITS/TRAYS/PACK) IMPLANT
WATER STERILE IRR 1000ML POUR (IV SOLUTION) ×12 IMPLANT
YANKAUER SUCT BULB TIP NO VENT (SUCTIONS) ×3 IMPLANT

## 2015-02-15 NOTE — Anesthesia Procedure Notes (Signed)
Spinal Patient location during procedure: OR Start time: 02/15/2015 1:08 PM End time: 02/15/2015 1:15 PM Staffing Anesthesiologist: Gaynelle AduFITZGERALD, Achsah Mcquade Performed by: anesthesiologist  Preanesthetic Checklist Completed: patient identified, surgical consent, pre-op evaluation, timeout performed, IV checked, risks and benefits discussed and monitors and equipment checked Spinal Block Patient position: sitting Prep: Betadine Patient monitoring: cardiac monitor, continuous pulse ox and blood pressure Approach: midline Location: L3-4 Injection technique: single-shot Needle Needle type: Quincke (Attempted 24G Pencan x 2)  Needle gauge: 22 G Needle length: 9 cm Assessment Sensory level: T8 Additional Notes Functioning IV was confirmed and monitors were applied. Sterile prep and drape, including hand hygiene and sterile gloves were used. The patient was positioned and the spine was prepped. The skin was anesthetized with lidocaine.  Free flow of clear CSF was obtained prior to injecting local anesthetic into the CSF.  The spinal needle aspirated freely following injection.  The needle was carefully withdrawn.  The patient tolerated the procedure well.

## 2015-02-15 NOTE — Progress Notes (Signed)
Report given to maryann rn as caregiver 

## 2015-02-15 NOTE — Interval H&P Note (Signed)
History and Physical Interval Note:  02/15/2015 12:30 PM  [name]  has presented today for surgery, with the diagnosis of RIGHT HIP DEGENERATIVE JOINT DISEASE  The various methods of treatment have been discussed with the patient and family. After consideration of risks, benefits and other options for treatment, the patient has consented to  Procedure(s): TOTAL HIP ARTHROPLASTY (Right) as a surgical intervention .  The patient's history has been reviewed, patient examined, no change in status, stable for surgery.  I have reviewed the patient's chart and labs.  Questions were answered to the patient's satisfaction.     Nestor LewandowskyOWAN,Antwine Agosto J

## 2015-02-15 NOTE — Transfer of Care (Signed)
Immediate Anesthesia Transfer of Care Note  Patient: Bailey [name]  Procedure(s) Performed: Procedure(s): TOTAL HIP ARTHROPLASTY (Right)  Patient Location: PACU  Anesthesia Type:Spinal  Level of Consciousness: awake, alert , oriented, patient cooperative and responds to stimulation  Airway & Oxygen Therapy: Patient Spontanous Breathing and Patient connected to nasal cannula oxygen  Post-op Assessment: Report given to RN and Post -op Vital signs reviewed and stable  Post vital signs: stable  Last Vitals:  Filed Vitals:   02/15/15 1135  BP: 149/76  Pulse: 52  Temp: 36.5 C  Resp: 20    Complications: No apparent anesthesia complications

## 2015-02-15 NOTE — Anesthesia Preprocedure Evaluation (Addendum)
Anesthesia Evaluation  Patient identified by MRN, date of birth, ID band Patient awake    Reviewed: Allergy & Precautions, H&P , NPO status , Patient's Chart, lab work & pertinent test results, reviewed documented beta blocker date and time   Airway Mallampati: II  TM Distance: >3 FB Neck ROM: Full    Dental no notable dental hx. (+) Upper Dentures, Partial Lower, Dental Advisory Given   Pulmonary asthma ,  breath sounds clear to auscultation  Pulmonary exam normal       Cardiovascular hypertension, Pt. on medications and Pt. on home beta blockers Rhythm:Regular Rate:Normal     Neuro/Psych Anxiety Depression CVA, Residual Symptoms    GI/Hepatic Neg liver ROS, GERD-  Medicated and Controlled,  Endo/Other  negative endocrine ROS  Renal/GU negative Renal ROS  negative genitourinary   Musculoskeletal  (+) Arthritis -, Osteoarthritis,    Abdominal   Peds  Hematology negative hematology ROS (+)   Anesthesia Other Findings   Reproductive/Obstetrics negative OB ROS                            Anesthesia Physical Anesthesia Plan  ASA: III  Anesthesia Plan: Spinal   Post-op Pain Management:    Induction: Intravenous  Airway Management Planned: Simple Face Mask  Additional Equipment:   Intra-op Plan:   Post-operative Plan:   Informed Consent: I have reviewed the patients History and Physical, chart, labs and discussed the procedure including the risks, benefits and alternatives for the proposed anesthesia with the patient or authorized representative who has indicated his/her understanding and acceptance.   Dental advisory given  Plan Discussed with: CRNA  Anesthesia Plan Comments:         Anesthesia Quick Evaluation

## 2015-02-15 NOTE — Progress Notes (Signed)
Pt difficult IV insertion. This nurse attempted twice and Rogelia MireCynthia Michael attempted twice. Theodoro Gristave, Database administratorCharge CRNA and Dr. Sampson GoonFitzgerald unable to attempt. Diane and Tabatha CRNAs at bedside at 1235 to attempt.

## 2015-02-15 NOTE — Op Note (Signed)
OPERATIVE REPORT    DATE OF PROCEDURE:  02/15/2015       PREOPERATIVE DIAGNOSIS:  RIGHT HIP DEGENERATIVE JOINT DISEASE                                                          POSTOPERATIVE DIAGNOSIS:  RIGHT HIP DEGENERATIVE JOINT DISEASE                                                           PROCEDURE:  R total hip arthroplasty using a 52 mm DePuy Pinnacle  Cup, [place] E[employer]ypex Hole Eliminator, 10-degree polyethylene liner index superior  and posterior, a +0 36 mm ceramic head, a 20x15x42x150 SROM stem, 20Dsm Sleeve   SURGEON: Bailey [name]    ASSISTANT:   Eric K. Gaylene BrooksPhillips PA-C  (present throughout entire procedure and necessary for timely completion of the procedure)   ANESTHESIA: spinal BLOOD LOSS: 400 FLUID REPLACEMENT: 1900 crystalloid Antibiotic: 2gm Ancef Tranexamic Acid: 2gm topical COMPLICATIONS: none    INDICATIONS FOR PROCEDURE: A 59 y.o. year-old With  RIGHT HIP DEGENERATIVE JOINT DISEASE   for 3 years, x-rays show bone-on-bone arthritic changes. Despite conservative measures with observation, anti-inflammatory medicine, narcotics, use of a cane, has severe unremitting pain and can ambulate only a few blocks before resting.  Patient desires elective R total hip arthroplasty to decrease pain and increase function. The risks, benefits, and alternatives were discussed at length including but not limited to the risks of infection, bleeding, nerve injury, stiffness, blood clots, the need for revision surgery, cardiopulmonary complications, among others, and they were willing to proceed. Questions answered     PROCEDURE IN DETAIL: The patient was identified by armband,  [facility], taken to the operating room , appropriate anesthetic monitors  were attached and general endotracheal anesthesia induced. Pt was rolled into the L lateral decubitus position and fixed there with a Stulberg Mark II pelvic clamp.  The R lower  extremity was then prepped and draped  in the usual sterile fashion from the ankle to the hemipelvis. A time-out  procedure was performed. The skin along the lateral hip and thigh  infiltrated with 10 mL of 0.5% Marcaine and epinephrine solution. We  then made a posterolateral approach to the hip. With a #10 blade, a 20 cm  incision was made through the skin and subcutaneous tissue down to the level of the  IT band. Small bleeders were identified and cauterized. The IT band was cut in  line with skin incision exposing the greater trochanter. A Cobra retractor was placed between the gluteus minimus and the superior hip joint capsule, and a spiked Cobra between the quadratus femoris and the inferior hip joint capsule. This isolated the short  external rotators and piriformis tendons. These were tagged with a #2 Ethibond  suture and cut off their insertion on the intertrochanteric crest. The posterior  capsule was then developed into an acetabular-based flap from Posterior Superior off of the acetabulum out over the femoral neck and back posterior inferior to the acetabular rim. This flap was tagged with  two #2 Ethibond sutures and retracted protecting the sciatic nerve. This exposed the arthritic femoral head and osteophytes. The hip was then flexed and internally rotated, dislocating the femoral head and a standard neck cut performed 1 fingerbreadth above the lesser trochanter.  A spiked Cobra was placed in the cotyloid notch and a Hohmann retractor was then used to lever the femur anteriorly off of the anterior pelvic column. A posterior-inferior wing retractor was placed at the junction of the acetabulum and the ischium completing the acetabular exposure.We then removed the peripheral osteophytes and labrum from the acetabulum. We then reamed the acetabulum up to 51 mm with basket reamers obtaining good coverage in all quadrants. We then irrigated with normal  saline solution and hammered into place a  52 mm pinnacle cup in 45  degrees of abduction and about 25 degrees of anteversion. More  peripheral osteophytes removed and a trial 10-degree liner placed with the  index superior-posterior. The hip was then flexed and internally rotated exposing the  proximal femur, which was entered with the initiating reamer followed by  the axial reamers up to a 15.5 mm full depth and 16mm partial depth. We then conically reamed to 20D to the correct depth for a 42 base neck. The calcar was milled to 20Dsm. A trial sleeve and stem was inserted in the 20 degrees anteversion, with a +0 36mm trial head. Trial reduction was then performed and excellent stability was noted with at 90 of flexion with 70 of internal rotation and then full extension with maximal external rotation. The hip could not be dislocated in full extension. The knee could easily flex  to about 130 degrees. We also stretched the abductors at this point,  because of the preexisting adductor contractures. All trial components  were then removed. The acetabulum was irrigated out with normal saline  solution. A titanium [facility], Theole Eliminator was then screwed into place  followed by a 10-degree polyethylene liner index superior-posterior. On  the femoral side a 20Dsm ZTT1 sleeve was hammered into place, followed by a 20x15x42x150 SROM stem in 25 degrees of anteversion. At this point, a +0 36 mm ceramic head was  hammered on the stem. The hip was reduced. We checked our stability  one more time and found it to be excellent. The wound was once again  thoroughly irrigated out with normal saline solution hand lavage. The  capsular flap and short external rotators were repaired back to the  intertrochanteric crest through drill holes with a #2 Ethibond suture.  The IT band was closed with running 1 Vicryl suture. The subcutaneous  tissue with 0 and 2-0 undyed Vicryl suture and the skin with running  3-0 vicryl subcuticular suture. Aquacil dressing was  applied. The patient was then unclamped, rolled supine, awaken extubated and taken to recovery room without difficulty in stable condition.   Bailey [name],Bailey [name] 02/15/2015, 2:37 PM

## 2015-02-15 NOTE — Anesthesia Postprocedure Evaluation (Signed)
Anesthesia Post Note  Patient: Bailey [name]  Procedure(s) Performed: Procedure(s) (LRB): TOTAL HIP ARTHROPLASTY (Right)  Anesthesia type: MAC/SAB  Patient location: PACU  Post pain: Pain level controlled  Post assessment: Patient's Cardiovascular Status Stable  Last Vitals:  Filed Vitals:   02/15/15 1842  BP: 120/68  Pulse: 63  Temp:   Resp: 20    Post vital signs: Reviewed and stable  Level of consciousness: sedated  Complications: No apparent anesthesia complications

## 2015-02-15 NOTE — Progress Notes (Signed)
Several duplicates noted to pt's home medications, pharmacy called, will attempt to erase duplicates.

## 2015-02-15 NOTE — OR Nursing (Signed)
In and out cath done postop approx 300 ml clear yellow urine removed

## 2015-02-15 NOTE — Progress Notes (Signed)
Care of [facility] by [facility] RN. Bair hugger on for 96.2 temp. Temporal (unable to get oral or axillary temp.) Will cont to monitor,

## 2015-02-15 NOTE — Progress Notes (Signed)
Pt able to move left leg side to side a bit but not able to bend LLE. Dr Ivin Bootyrews updated & he came by to see pt. Will keep in PACU longer to monitor.

## 2015-02-16 ENCOUNTER — Encounter (HOSPITAL_COMMUNITY): Payer: Self-pay | Admitting: Orthopedic Surgery

## 2015-02-16 LAB — CBC
HEMATOCRIT: 23.8 % — AB (ref 36.0–46.0)
HEMOGLOBIN: 8.5 g/dL — AB (ref 12.0–15.0)
MCH: 28.9 pg (ref 26.0–34.0)
MCHC: 35.7 g/dL (ref 30.0–36.0)
MCV: 81 fL (ref 78.0–100.0)
Platelets: 174 10*3/uL (ref 150–400)
RBC: 2.94 MIL/uL — AB (ref 3.87–5.11)
RDW: 13.4 % (ref 11.5–15.5)
WBC: 4.8 10*3/uL (ref 4.0–10.5)

## 2015-02-16 NOTE — Progress Notes (Signed)
Patient ID: Bailey [name], female   DOB: August 17, 1956, 59 y.o.   MRN: [id number] PATIENT ID: Bailey DarkMarguerite B Hulon  MRN: [id number]  DOB/AGE:  August 17, 1956 / 59 y.o.  1 Day Post-Op Procedure(s) (LRB): TOTAL HIP ARTHROPLASTY (Right)    PROGRESS NOTE Subjective: Patient is alert, oriented, x1 Nausea, x1 Vomiting, yes passing gas, no Bowel Movement. Taking PO sips. Denies SOB, Chest or Calf Pain. Using Incentive Spirometer, PAS in place. Ambulate WBAT Patient reports pain as 6 on 0-10 scale  .    Objective: Vital signs in last 24 hours: Filed Vitals:   02/15/15 2108 02/16/15 0042 02/16/15 0059 02/16/15 0623  BP: 124/76 138/63 139/71 117/63  Pulse: 72 77 70 75  Temp: 98.4 F (36.9 C) 99.2 F (37.3 C) 97 F (36.1 C) 98.9 F (37.2 C)  TempSrc: Oral Oral Oral Oral  Resp:      Height:      Weight: 94.121 kg (207 lb 8 oz)     SpO2: 97% 98% 100% 93%      Intake/Output from previous day: I/O last 3 completed shifts: In: 2785.4 [P.O.:200; I.V.:2085.4; IV Piggyback:500] Out: 951 [Urine:501; Blood:450]   Intake/Output this shift:     LABORATORY DATA:  Recent Labs  02/16/15 0600  WBC 4.8  HGB 8.5*  HCT 23.8*  PLT 174    Examination: Neurologically intact ABD soft Neurovascular intact Sensation intact distally Intact pulses distally Dorsiflexion/Plantar flexion intact Incision: dressing C/D/I No cellulitis present Compartment soft} XR AP&Lat of hip shows well placed\fixed THA  Assessment:   1 Day Post-Op Procedure(s) (LRB): TOTAL HIP ARTHROPLASTY (Right) ADDITIONAL DIAGNOSIS:  Expected Acute Blood Loss Anemia, Hypertension, GERD, PTSD  Plan: PT/OT WBAT, THA  posterior precautions  DVT Prophylaxis: SCDx72 hrs, ASA 325 mg BID x 2 weeks  DISCHARGE PLAN: Skilled Nursing Facility/Rehab, [facility]  DISCHARGE NEEDS: HHPT, Walker and 3-in-1 comode seat

## 2015-02-16 NOTE — Clinical Social Work Note (Signed)
Clinical Social Work Assessment  Patient Details  Name: Bailey [name] MRN: [id number] Date of Birth: 25-Jul-1956  Date of referral:  02/16/15               Reason for consult:  Facility Placement, Discharge Planning                Permission sought to share information with:   (Patient did not request to share information.) Permission granted to share information::  No  Name::        Agency::     Relationship::     Contact Information:     Housing/Transportation Living arrangements for the past 2 months:  Single Family Home Source of Information:  Patient Patient Interpreter Needed:  None Criminal Activity/Legal Involvement Pertinent to Current Situation/Hospitalization:  No - Comment as needed Significant Relationships:  Other(Comment) (Patient did not report a support system.) Lives with:  Self Do you feel safe going back to the place where you live?  No (High fall risk.) Need for family participation in patient care:  No (Coment)  Care giving concerns:  Patient expressed no concerns regarding discharge disposition.   Social Worker assessment / plan:  CSW received referral regarding SNF placement at time of discharge. CSW met with patient at bedside to discuss discharge disposition. Per patient, patient prefers to discharge to [facility] once medically stable for discharge. CSW to continue to follow and assist with discharge planning needs.  Employment status:  Retired Forensic scientist:  Other (Comment Required) Personnel officer) PT Recommendations:  [place] / Referral to community resources:  [place]  Patient/Family's Response to care:  Patient understanding and agreeable to CSW plan of care.  Patient/Family's Understanding of and Emotional Response to Diagnosis, Current Treatment, and Prognosis:  Patient understanding and agreeable to CSW plan of care.  Emotional Assessment Appearance:  Appears older than stated  age Attitude/Demeanor/Rapport:  Guarded Affect (typically observed):  Quiet, Flat Orientation:  Oriented to Self, Oriented to Place, Oriented to  Time, Oriented to Situation Alcohol / Substance use:  Not Applicable Psych involvement (Current and /or in the community):  No (Comment) (Not appropriate on this admission.)  Discharge Needs  Concerns to be addressed:  No discharge needs identified Readmission within the last 30 days:  No Current discharge risk:  None Barriers to Discharge:  No Barriers Identified   Caroline Sauger, LCSW 02/16/2015, 3:17 PM [phone]

## 2015-02-16 NOTE — Evaluation (Signed)
Occupational Therapy Evaluation and Discharge Patient Details Name: Bailey DarkMarguerite B Mclear MRN: [id number] DOB: 10/22/1955 Today's Date: 02/16/2015    History of Present Illness 59 y.o. female s/p right total hip arthroplasty   Clinical Impression   This 59 yo female admitted and underwent above presents to acute OT with increased pain, increased dizziness, increased nausea, decreased mobility, decreased A/PROM of RLE, and decreased balance all affecting her ability to care for herself at home where she lives alone. [facility]--acute OT will sign off.     Follow Up Recommendations  SNF    Equipment Recommendations   (TBD at next venue)       Precautions / Restrictions Precautions Precautions: Posterior Hip Precaution Booklet Issued: Yes (comment) Precaution Comments: reviewed Restrictions Weight Bearing Restrictions: No RLE Weight Bearing: Weight bearing as tolerated      Mobility Bed Mobility Overal bed mobility: Modified Independent             General bed mobility comments: Pt up in recliner upon arrival  Transfers Overall transfer level: Needs assistance Equipment used: Rolling walker (2 wheeled) Transfers: Sit to/from Stand Sit to Stand: Mod assist (from recliner)                 ADL Overall ADL's : Needs assistance/impaired Eating/Feeding: Independent;Sitting   Grooming: Set up;Sitting   Upper Body Bathing: Set up;Sitting   Lower Body Bathing: Maximal assistance (with min A sit<>stand)   Upper Body Dressing : Minimal assistance;Sitting   Lower Body Dressing: Maximal assistance (with Mod A sit<>stand)                       Vision Additional Comments: No change from baseline          Pertinent Vitals/Pain Pain Assessment: 0-10 Pain Score: 8  Pain Location: right hip Pain Descriptors / Indicators: Aching;Sore Pain Intervention(s): Monitored during session;Patient requesting pain meds-RN notified;RN  gave pain meds during session     Hand Dominance Right   Extremity/Trunk Assessment Upper Extremity Assessment Upper Extremity Assessment: Overall WFL for tasks assessed         Communication Communication Communication: No difficulties   Cognition Arousal/Alertness: Awake/alert Behavior During Therapy: WFL for tasks assessed/performed Overall Cognitive Status: Impaired/Different from baseline Area of Impairment: Problem solving           Problem Solving: Slow processing;Difficulty sequencing;Requires verbal cues;Requires tactile cues                Home Living Family/patient expects to be discharged to:: Skilled nursing facility Living Arrangements: Alone Available Help at Discharge: Skilled Nursing Facility Type of Home: Apartment Home Access: Level entry     Home Layout: One level               Home Equipment: Walker - 2 wheels;Bedside commode;Shower seat          Prior Functioning/Environment Level of Independence: Independent with assistive device(s)        Comments: used RW for ambulating    OT Diagnosis: Generalized weakness;Acute pain   OT Problem List: Decreased strength;Decreased range of motion;Decreased activity tolerance;Impaired balance (sitting and/or standing);Pain;Obesity;Decreased knowledge of use of DME or AE;Decreased cognition   OT Treatment/Interventions:      OT Goals(Current goals can be found in the care plan section) Acute Rehab OT Goals Patient Stated Goal: to go to rehab then home  OT Frequency:  End of Session Equipment Utilized During Treatment: Gait belt;Rolling walker Nurse Communication: Patient requests pain meds  Activity Tolerance: Patient limited by pain (limited by nausea and dizziness) Patient left: in chair;with call bell/phone within reach;with family/visitor present   Time: [id number] OT Time Calculation (min): 29 min Charges:  OT General Charges $OT Visit: 1 Procedure OT  Evaluation $Initial OT Evaluation Tier I: 1 Procedure OT Treatments $Self Care/Home Management : 8-22 mins  Evette GeorgesLeonard, Maliya Marich Eva [phone] 02/16/2015, 2:37 PM

## 2015-02-16 NOTE — Evaluation (Signed)
Physical Therapy Evaluation Patient Details Name: Bailey [name] MRN: [id number] DOB: April 19, 1956 Today's Date: 02/16/2015   History of Present Illness  [age] y.o. female s/p right total hip arthroplasty  Clinical Impression  Pt is s/p Rt THA resulting in the deficits listed below (see PT Problem List). Slightly disoriented and processing information slowly. Required extra time for transfer and gait training today, at a min assist level. Urinary incontinence upon standing, RN aware. Pt will benefit from skilled PT to increase their independence and safety with mobility to allow discharge to the venue listed below.      Follow Up Recommendations SNF;Supervision/Assistance - 24 hour    Equipment Recommendations  None recommended by PT    Recommendations for Other Services       Precautions / Restrictions Precautions Precautions: Posterior Hip Precaution Booklet Issued: Yes (comment) Precaution Comments: reviewed Restrictions Weight Bearing Restrictions: Yes RLE Weight Bearing: Weight bearing as tolerated      Mobility  Bed Mobility Overal bed mobility: Modified Independent             General bed mobility comments: requires extra time, use of rail. no physical assist.  Transfers Overall transfer level: Needs assistance Equipment used: Rolling walker (2 wheeled) Transfers: Sit to/from Stand Sit to Stand: Min assist;From elevated surface         General transfer comment: min assist for boost to stand from elevated bed surface and BSC. Requires frequent cues to initiate stand. Pt lethargic and easily forgets task at hand. VC for hand placement prior to standing.  Ambulation/Gait Ambulation/Gait assistance: Min assist Ambulation Distance (Feet): 8 Feet Assistive device: Rolling walker (2 wheeled) Gait Pattern/deviations: Step-to pattern;Decreased step length - left;Decreased stance time - right;Antalgic;Trunk flexed Gait velocity: decreased   General Gait Details:  Educated on safe DME use with a rolling walker. Min assist for walker placement. VC for directions and control of RW. VC for upright posture. Moderately antalgic but able to bear fair amount of weight through RLE.  Stairs            Wheelchair Mobility    Modified Rankin (Stroke Patients Only)       Balance Overall balance assessment: Needs assistance Sitting-balance support: No upper extremity supported;Feet supported Sitting balance-Leahy Scale: Fair     Standing balance support: Bilateral upper extremity supported Standing balance-Leahy Scale: Poor                               Pertinent Vitals/Pain Pain Assessment: No/denies pain Pain Location: Rt hip Pain Intervention(s): Monitored during session;Repositioned    Home Living Family/patient expects to be discharged to:: Skilled nursing facility Living Arrangements: Alone Available Help at Discharge: Skilled Nursing Facility Type of Home: Apartment Home Access: Level entry     Home Layout: One level Home Equipment: Environmental consultantWalker - 2 wheels;Bedside commode;Shower seat      Prior Function Level of Independence: Independent with assistive device(s)         Comments: used RW for ambulating     Hand Dominance   Dominant Hand: Right    Extremity/Trunk Assessment   Upper Extremity Assessment: Defer to OT evaluation           Lower Extremity Assessment: RLE deficits/detail RLE Deficits / Details: decreased strength and ROM as expected post op       Communication   Communication: No difficulties  Cognition Arousal/Alertness: Lethargic;Suspect due to medications Behavior During Therapy: [facility] BeaverWFL for  tasks assessed/performed Overall Cognitive Status: Impaired/Different from baseline Area of Impairment: Orientation;Problem solving Orientation Level: Disoriented to;Place [facility]. Extra time for time)           Problem Solving: Slow processing;Decreased initiation;Requires  verbal cues      General Comments General comments (skin integrity, edema, etc.): lethargic throughout therapy session. Pt had episode of urinary incontinence upon standing.    Exercises Total Joint Exercises Ankle Circles/Pumps: AROM;Both;10 reps;Seated      Assessment/Plan    PT Assessment Patient needs continued PT services  PT Diagnosis Difficulty walking;Abnormality of gait;Acute pain   PT Problem List Decreased strength;Decreased range of motion;Decreased activity tolerance;Decreased balance;Decreased mobility;Decreased cognition;Decreased knowledge of use of DME;Decreased knowledge of precautions;Pain  PT Treatment Interventions DME instruction;Gait training;Functional mobility training;Therapeutic activities;Therapeutic exercise;Balance training;Neuromuscular re-education;Cognitive remediation;Patient/family education;Modalities   PT Goals (Current goals can be found in the Care Plan section) Acute Rehab PT Goals Patient Stated Goal: go to rehab PT Goal Formulation: With patient Time For Goal Achievement: 02/23/15 Potential to Achieve Goals: Good    Frequency 7X/week   Barriers to discharge Decreased caregiver support lives alone    Co-evaluation               End of Session Equipment Utilized During Treatment: Gait belt Activity Tolerance: Patient tolerated treatment well Patient left: in chair;with call bell/phone within reach Nurse Communication: Mobility status (urinary incontinence. pt cleaned and in chair)         Time: [id number] PT Time Calculation (min) (ACUTE ONLY): 38 min   Charges:   PT Evaluation $Initial PT Evaluation Tier I: 1 Procedure PT Treatments $Therapeutic Activity: 23-37 mins   PT G Codes:        [place] Mount[place]bour, Rawleigh Rode S 02/16/2015, 12:31 PM Sunday SpillersLogan Secor Turb[place]bour, [place]PT [phone]

## 2015-02-17 LAB — CBC
HCT: 22.6 % — ABNORMAL LOW (ref 36.0–46.0)
Hemoglobin: 8.3 g/dL — ABNORMAL LOW (ref 12.0–15.0)
MCH: 29.6 pg (ref 26.0–34.0)
MCHC: 36.7 g/dL — AB (ref 30.0–36.0)
MCV: 80.7 fL (ref 78.0–100.0)
PLATELETS: 164 10*3/uL (ref 150–400)
RBC: 2.8 MIL/uL — ABNORMAL LOW (ref 3.87–5.11)
RDW: 13.4 % (ref 11.5–15.5)
WBC: 8.5 10*3/uL (ref 4.0–10.5)

## 2015-02-17 MED ORDER — TRAMADOL HCL 50 MG PO TABS: 50.0000 mg | ORAL_TABLET | Freq: Four times a day (QID) | ORAL | Status: DC | PRN

## 2015-02-17 MED ORDER — TIZANIDINE HCL 2 MG PO CAPS: 2.0000 mg | ORAL_CAPSULE | Freq: Three times a day (TID) | ORAL | Status: DC

## 2015-02-17 MED ORDER — ASPIRIN EC 325 MG PO TBEC: 325.0000 mg | DELAYED_RELEASE_TABLET | Freq: Two times a day (BID) | ORAL | Status: AC

## 2015-02-17 MED ORDER — OXYCODONE-ACETAMINOPHEN 5-325 MG PO TABS: 1.0000 | ORAL_TABLET | ORAL | Status: DC | PRN

## 2015-02-17 NOTE — Progress Notes (Signed)
PATIENT ID: Bailey [name]  MRN: [id number]  DOB/AGE:  [birth year] / 59 y.o.  2 Days Post-Op Procedure(s) (LRB): TOTAL HIP ARTHROPLASTY (Right)    PROGRESS NOTE Subjective: Patient is alert, oriented, no Nausea, no Vomiting, yes passing gas, no Bowel Movement. Taking PO well with pt eating breakfast. Denies SOB, Chest or Calf Pain. Using Incentive Spirometer, PAS in place. Ambulate WBAT with pt walking 8 ft with therapy yesterday. Patient reports pain as 7 on 0-10 scale  .    Objective: Vital signs in last 24 hours: Filed Vitals:   02/16/15 1400 02/16/15 2053 02/17/15 0709 02/17/15 0723  BP: 119/61 127/69 126/73   Pulse: 77 81 84   Temp: 99.8 F (37.7 C) 99.3 F (37.4 C) 101 F (38.3 C) 100.2 F (37.9 C)  TempSrc: Oral Oral Oral Oral  Resp: _0 Height:      Weight:      SpO2: 97% 98% 99%       Intake/Output from previous day: I/O last 3 completed shifts: In: 1035.4 [P.O.:600; I.V.:435.4] Out: 1 [Urine:1]   Intake/Output this shift:     LABORATORY DATA:  Recent Labs  02/16/15 0600 02/17/15 0450  WBC 4.8 8.5  HGB 8.5* 8.3*  HCT 23.8* 22.6*  PLT 174 164    Examination: Neurologically intact Neurovascular intact Sensation intact distally Intact pulses distally Dorsiflexion/Plantar flexion intact Incision: dressing C/D/I No cellulitis present Compartment soft} XR AP&Lat of hip shows well placed\fixed THA  Assessment:   2 Days Post-Op Procedure(s) (LRB): TOTAL HIP ARTHROPLASTY (Right) ADDITIONAL DIAGNOSIS:  Expected Acute Blood Loss Anemia, Hypertension and GERD, PTSD  Plan: PT/OT WBAT, THA  posterior precautions  DVT Prophylaxis: SCDx72 hrs, ASA 325 mg BID x 2 weeks  DISCHARGE PLAN: Skilled Nursing Facility/Rehab, [place] when bed available and therapy goals met.  DISCHARGE NEEDS: HHPT, HHRN, Walker and 3-in-1 comode seat

## 2015-02-17 NOTE — Clinical Social Work Placement (Signed)
   CLINICAL SOCIAL WORK PLACEMENT  NOTE  Date:  02/17/2015  Patient Details  Name: Bailey [name] MRN: [id number] Date of Birth: Jan 19, 1956  Clinical Social Work is seeking post-discharge placement for this patient at the Skilled  Nursing Facility level of care (*CSW will initial, date and re-position this form in  chart as items are completed):  Yes   Patient/family provided with [place] [facility] Social [name] list of facilities offering this level of care within the geographic area requested by the patient (or if unable, by the patient'[name] family).  Yes   Patient/family informed of their freedom to choose among providers that offer the needed level of care, that participate in Medicare, Medicaid or managed care program needed by the patient, have an available bed and are willing to accept the patient.  Yes   Patient/family informed of [name] ownership interest in [facility] and [facility], as well as of the fact that they are under no obligation to receive care at these facilities.  PASRR submitted to EDS on 02/17/15     PASRR number received on 02/17/15     Existing PASRR number confirmed on       FL2 transmitted to all facilities in geographic area requested by pt/family on 02/17/15     FL2 transmitted to all facilities within larger geographic area on 02/17/15     Patient informed that his/her managed care company has contracts with or will negotiate with certain facilities, including the following:        Yes   Patient/family informed of bed offers received.  [facility]     Physician recommends and patient chooses bed at      [facility].  Patient to be transferred to facility by [facility]AR     Patient family notified on 02/17/15 of transfer.  Name of family member notified:  Patient updated at bedside.     PHYSICIAN       Additional Comment:     _______________________________________________ Bailey [name], Bailey Huss S, LCSW 02/17/2015, 12:24 PM [phone]

## 2015-02-17 NOTE — Progress Notes (Signed)
Report given to Graciella Beltonianne, Charity fundraiserN at [facility].

## 2015-02-17 NOTE — Discharge Instructions (Signed)

## 2015-02-17 NOTE — Progress Notes (Signed)
PT Cancellation Note  Patient Details Name: Bailey [name] MRN: [id number] DOB: November 16, 1955   Cancelled Treatment:     Pt reported she is having a lot of pain in her abdomin and down her leg. Pt reported she has a hernia. Nursing has been made aware of her pain status. Pt refused PT this am due to pain. Pt has reported she did get OOB and transfered to a BSC this morning. Will attempt again later today.   Greggory StallionWrisley, Bailey [name] 02/17/2015, 9:11 AM

## 2015-02-17 NOTE — Discharge Summary (Signed)
Patient ID: Bailey [name] MRN: [id number] DOB/AGE: 61/10/1955 61 y.o.  Admit date: 02/15/2015 Discharge date: 02/17/2015  Admission Diagnoses:  Principal Problem:   Primary osteoarthritis of right hip   Discharge Diagnoses:  Same  Past Medical History  Diagnosis Date  . Hypertension     all BP meds. managed by Dr. Eula [name]    . Stroke     2005, R sided weakness   . Pneumonia     while at [facility].- 1990's told that she had pneumonia & was isolated   . Arthritis     OA- R knee, L hip   . Anxiety     "Breakdown" in the 1990's, hosp. for >1 wk. at [facility]    . Incontinence of urine     weak bladder  . Hypertension   . Stroke 2005    ? weakness in hand and arms per mother. patient unsure  . Asthma     years ago. "no problems in a long while."  . Anxiety   . Depression   . GERD (gastroesophageal reflux disease)   . Constipation   . Goiter     removed  at age 21    Surgeries: Procedure(s): TOTAL HIP ARTHROPLASTY on 02/15/2015   Consultants:    Discharged Condition: Improved  Hospital Course: Bailey [name] is an 59 y.o. female who was admitted 02/15/2015 for operative treatment ofPrimary osteoarthritis of right hip. Patient has severe unremitting pain that affects sleep, daily activities, and work/hobbies. After pre-op clearance the patient was taken to the operating room on 02/15/2015 and underwent  Procedure(s): TOTAL HIP ARTHROPLASTY.    Patient was given perioperative antibiotics: Anti-infectives    Start     Dose/Rate Route Frequency Ordered Stop   02/15/15 1200  ceFAZolin (ANCEF) IVPB 2 g/50 mL premix     2 g 100 mL/hr over 30 Minutes Intravenous To Surgery 02/14/15 1410 02/15/15 1320   02/15/15 1137  ceFAZolin (ANCEF) 2-3 GM-% IVPB SOLR    Comments:  Bailey [name]   : cabinet override      02/15/15 1137 02/15/15 2344       Patient was given sequential compression devices, early ambulation, and chemoprophylaxis to prevent DVT.  Patient benefited  maximally from hospital stay and there were no complications.    Recent vital signs: Patient Vitals for the past 24 hrs:  BP Temp Temp src Pulse Resp SpO2  02/17/15 0723 - 100.2 F (37.9 C) Oral - - -  02/17/15 0709 126/73 mmHg (!) 101 F (38.3 C) Oral 84 16 99 %  02/16/15 2053 127/69 mmHg 99.3 F (37.4 C) Oral 81 17 98 %  02/16/15 1400 119/61 mmHg 99.8 F (37.7 C) Oral 77 18 97 %     Recent laboratory studies:  Recent Labs  02/16/15 0600 02/17/15 0450  WBC 4.8 8.5  HGB 8.5* 8.3*  HCT 23.8* 22.6*  PLT 174 164     Discharge Medications:     Medication List    STOP taking these medications        HYDROcodone-acetaminophen 5-325 MG per tablet  Commonly known as:  NORCO/VICODIN     PRESCRIPTION MEDICATION     VITAMIN C PO      TAKE these medications        acetaminophen 650 MG CR tablet  Commonly known as:  TYLENOL  Take 1,300 mg by mouth 2 (two) times daily as needed for pain.     aspirin EC 325 MG tablet  Take 1 tablet (325 mg total) by mouth 2 (two) times daily.     citalopram 40 MG tablet  Commonly known as:  CELEXA  Take 40 mg by mouth every morning.     clonazePAM 0.5 MG tablet  Commonly known as:  KLONOPIN  Take 0.5 mg by mouth 2 (two) times daily.     cloNIDine 0.3 MG tablet  Commonly known as:  CATAPRES  Take 0.3 mg by mouth 2 (two) times daily.     docusate sodium 100 MG capsule  Commonly known as:  COLACE  Take 100 mg by mouth daily as needed for mild constipation.     doxazosin 8 MG tablet  Commonly known as:  CARDURA  Take 8 mg by mouth at bedtime.     FLAX SEED OIL PO  Take 1,200 mg by mouth daily.     losartan 100 MG tablet  Commonly known as:  COZAAR  Take 100 mg by mouth daily.     metoprolol succinate 100 MG 24 hr tablet  Commonly known as:  TOPROL-XL  Take 100 mg by mouth every morning.     omeprazole 40 MG capsule  Commonly known as:  PRILOSEC  Take 40 mg by mouth every morning.     oxyCODONE-acetaminophen 5-325 MG  per tablet  Commonly known as:  ROXICET  Take 1 tablet by mouth every 4 (four) hours as needed.     potassium chloride SA 20 MEQ tablet  Commonly known as:  K-DUR,KLOR-CON  Take 20 mEq by mouth 2 (two) times daily.     QUEtiapine 50 MG tablet  Commonly known as:  SEROQUEL  Take 1 tablet (50 mg total) by mouth at bedtime.     tizanidine 2 MG capsule  Commonly known as:  ZANAFLEX  Take 1 capsule (2 mg total) by mouth 3 (three) times daily.     traMADol 50 MG tablet  Commonly known as:  ULTRAM  Take 1 tablet (50 mg total) by mouth every 6 (six) hours as needed for moderate pain.     triamterene-hydrochlorothiazide 37.5-25 MG per tablet  Commonly known as:  MAXZIDE-25  Take 2 tablets by mouth daily.     VISINE OP  Place 1 drop into both eyes daily as needed (dry eyes).     vitamin C 500 MG tablet  Commonly known as:  ASCORBIC ACID  Take 500 mg by mouth daily.     Vitamin D 2000 UNITS tablet  Take 2,000 Units by mouth daily.      ASK your doctor about these medications        warfarin 5 MG tablet  Commonly known as:  COUMADIN  Take as directed by HHN. INR target 1.5-2.5. D/C after 2 weeks from date of surgery        Diagnostic Studies: Dg Chest 2 View  02/03/2015   CLINICAL DATA:  59 year old female with a history of scheduled right hip arthroplasty  EXAM: CHEST - 2 VIEW  COMPARISON:  None.  FINDINGS: Cardiomediastinal silhouette projects within normal limits in size and contour. No confluent airspace disease, pneumothorax, or pleural effusion.  No displaced fracture.  Unremarkable appearance of the upper abdomen.  IMPRESSION: No radiographic evidence of acute cardiopulmonary disease.  Signed,  Bailey NeuJaime S. Loreta AveWagner, DO  [facility]  [facility]   Electronically Signed   By: Bailey [name]  [name] D.O.   On: 02/03/2015 15:41   Dg Pelvis Portable  02/15/2015   CLINICAL DATA:  Status post right total hip arthroplasty.  EXAM: PORTABLE PELVIS 1-2  VIEWS  COMPARISON:  December 23, 2011.  FINDINGS: Patient is status post interval placement of right total hip arthroplasty. The femoral and acetabular components appear to be well situated. Left total hip arthroplasty noted on prior for exam is stable. No fracture or dislocation is noted.  IMPRESSION: Status post interval placement of right total hip arthroplasty.   Electronically Signed   By: Lupita RaiderJames  Green Jr, M.D.   On: 02/15/2015 16:17    Disposition: 03-Skilled Nursing Facility      Discharge Instructions    Call MD / Call 911    Complete by:  As directed   If you experience chest pain or shortness of breath, CALL 911 and be transported to the hospital emergency room.  If you develope a fever above 101 F, pus (white drainage) or increased drainage or redness at the wound, or calf pain, call your surgeon's office.     Change dressing    Complete by:  As directed   You may change your dressing on day 5, then change the dressing daily with sterile 4 x 4 inch gauze dressing and paper tape.  You may clean the incision with alcohol prior to redressing     Constipation Prevention    Complete by:  As directed   Drink plenty of fluids.  Prune juice may be helpful.  You may use a stool softener, such as Colace (over the counter) 100 mg twice a day.  Use MiraLax (over the counter) for constipation as needed.     Diet - low sodium heart healthy    Complete by:  As directed      Discharge instructions    Complete by:  As directed   Follow up in office with Dr. Turner Danielsowan in 2 weeks.     Driving restrictions    Complete by:  As directed   No driving for 2 weeks     Follow the hip precautions as taught in Physical Therapy    Complete by:  As directed      Increase activity slowly as tolerated    Complete by:  As directed      Patient may shower    Complete by:  As directed   You may shower without a dressing once there is no drainage.  Do not wash over the wound.  If drainage remains, cover wound with  plastic wrap and then shower.           Follow-up Information    Follow up with Nestor LewandowskyOWAN,FRANK J, MD In 2 weeks.   Specialty:  Orthopedic Surgery   Contact information:   [address] [place] [address] [phone]        Signed: Vear ClockHILLIPS, Amaal Dimartino R 02/17/2015, 8:50 AM

## 2015-02-17 NOTE — Progress Notes (Signed)
Harvest DarkMarguerite B Cordts discharged SNF [facility] per MD order. Discharge instructions reviewed and discussed with patient. All questions and concerns answered. Copy of instructions and scripts sent with patient. IV removed.  [facility] No distress noted upon discharge.   Rosita FireScott, Seamus Warehime R 02/17/2015 12:26 PM

## 2015-02-17 NOTE — Discharge Planning (Signed)
[facility] Patient updated at bedside.  [facility] report number: [phone] Transportation: EMS Sharin Mons([facility]) scheduled for 1pm (per facility)  Marcelline DeistEmily Jontavious [place], [place] - [phone] Clinical Social Work Department Orthopedics [phone]([id number]) and Surgical [phone]([id number])

## 2015-02-17 NOTE — Progress Notes (Signed)
Physical Therapy Treatment Patient Details Name: Harvest DarkMarguerite B Foti MRN: [id number] DOB: [birth year] Today's Date: 02/17/2015    History of Present Illness 59 y.o. female s/p right total hip arthroplasty    PT Comments    Pt is pending d/c to SNF today. Pt agreeable to walk in room after second attempt to see pt today. Pt demonstrated slow processing and problem solving. No family available to determine baseline cognitive functioning. Pt unable to recall posterior hip precautions. Pt ambulated to bathroom and to the door with min guard assist using RW. Pt was min assist with transfers. Recommend d/c to SNF for further PT for improved mobility and independence.  Follow Up Recommendations  SNF     Equipment Recommendations  None recommended by PT    Recommendations for Other Services       Precautions / Restrictions Precautions Precautions: Posterior Hip Precaution Comments: reviewed, pt recalled 0/3 Restrictions RLE Weight Bearing: Weight bearing as tolerated    Mobility  Bed Mobility Overal bed mobility: Needs Assistance Bed Mobility: Supine to Sit     Supine to sit: Min assist     General bed mobility comments: cues for technique, assistance with moving R LE over in the bed.  Transfers Overall transfer level: Needs assistance Equipment used: Rolling walker (2 wheeled) Transfers: Sit to/from Stand Sit to Stand: Min assist         General transfer comment: Cues for hand placement and increasing forward weight shit over BOS.   Ambulation/Gait Ambulation/Gait assistance: Min guard Ambulation Distance (Feet): 18 Feet Assistive device: Rolling walker (2 wheeled) Gait Pattern/deviations: Step-through pattern;Decreased step length - left;Decreased stance time - right;Decreased stride length;Trunk flexed Gait velocity: slow   General Gait Details: cues for upright posture   Stairs            Wheelchair Mobility    Modified Rankin (Stroke Patients Only)        Balance Overall balance assessment: Needs assistance Sitting-balance support: No upper extremity supported Sitting balance-Leahy Scale: Good       Standing balance-Leahy Scale: Fair                      Cognition Arousal/Alertness: Awake/alert Behavior During Therapy: WFL for tasks assessed/performed Overall Cognitive Status: No family/caregiver present to determine baseline cognitive functioning Area of Impairment: Problem solving             Problem Solving: Slow processing;Difficulty sequencing;Requires verbal cues;Requires tactile cues      Exercises Total Joint Exercises Ankle Circles/Pumps: AROM;Both;10 reps;Supine Heel Slides: AAROM;Strengthening;Right;10 reps;Supine Hip ABduction/ADduction: AAROM;Strengthening;Right;10 reps;Supine Long Arc Quad: AROM;Strengthening;Right;10 reps;Supine    General Comments        Pertinent Vitals/Pain Pain Assessment: 0-10 Pain Score: 5  Pain Location: right hip Pain Descriptors / Indicators: Aching Pain Intervention(s): Limited activity within patient's tolerance;Monitored during session    Home Living                      Prior Function            PT Goals (current goals can now be found in the care plan section) Progress towards PT goals: Progressing toward goals    Frequency       PT Plan Current plan remains appropriate    Co-evaluation             End of Session Equipment Utilized During Treatment: Gait belt Activity Tolerance: Patient tolerated treatment well Patient left: in chair;with call  bell/phone within reach     Time: 1158-1224 PT Time Calculation (min) (ACUTE ONLY): 26 min  Charges:  $Gait Training: 8-22 mins $Therapeutic Exercise: 8-22 mins                    G Codes:      Greggory StallionWrisley, Bren Steers Kerstine 02/17/2015, 12:24 PM

## 2015-02-20 ENCOUNTER — Non-Acute Institutional Stay (SKILLED_NURSING_FACILITY): Payer: Medicare Other | Admitting: Adult Health

## 2015-02-20 ENCOUNTER — Encounter: Payer: Self-pay | Admitting: Adult Health

## 2015-02-20 DIAGNOSIS — F333 Major depressive disorder, recurrent, severe with psychotic symptoms: Secondary | ICD-10-CM

## 2015-02-20 DIAGNOSIS — K5900 Constipation, unspecified: Secondary | ICD-10-CM | POA: Diagnosis not present

## 2015-02-20 DIAGNOSIS — D62 Acute posthemorrhagic anemia: Secondary | ICD-10-CM | POA: Diagnosis not present

## 2015-02-20 DIAGNOSIS — K219 Gastro-esophageal reflux disease without esophagitis: Secondary | ICD-10-CM

## 2015-02-20 DIAGNOSIS — M1611 Unilateral primary osteoarthritis, right hip: Secondary | ICD-10-CM

## 2015-02-20 DIAGNOSIS — I10 Essential (primary) hypertension: Secondary | ICD-10-CM

## 2015-02-20 DIAGNOSIS — F411 Generalized anxiety disorder: Secondary | ICD-10-CM | POA: Diagnosis not present

## 2015-02-21 ENCOUNTER — Non-Acute Institutional Stay (SKILLED_NURSING_FACILITY): Payer: Medicare Other | Admitting: Internal Medicine

## 2015-02-21 ENCOUNTER — Other Ambulatory Visit: Payer: Self-pay | Admitting: *Deleted

## 2015-02-21 DIAGNOSIS — K219 Gastro-esophageal reflux disease without esophagitis: Secondary | ICD-10-CM | POA: Diagnosis not present

## 2015-02-21 DIAGNOSIS — K5901 Slow transit constipation: Secondary | ICD-10-CM

## 2015-02-21 DIAGNOSIS — D62 Acute posthemorrhagic anemia: Secondary | ICD-10-CM | POA: Diagnosis not present

## 2015-02-21 DIAGNOSIS — F333 Major depressive disorder, recurrent, severe with psychotic symptoms: Secondary | ICD-10-CM

## 2015-02-21 DIAGNOSIS — M1611 Unilateral primary osteoarthritis, right hip: Secondary | ICD-10-CM | POA: Diagnosis not present

## 2015-02-21 DIAGNOSIS — N289 Disorder of kidney and ureter, unspecified: Secondary | ICD-10-CM | POA: Diagnosis not present

## 2015-02-21 DIAGNOSIS — I10 Essential (primary) hypertension: Secondary | ICD-10-CM | POA: Diagnosis not present

## 2015-02-21 MED ORDER — TRAMADOL HCL 50 MG PO TABS: ORAL_TABLET | ORAL | Status: DC

## 2015-02-21 NOTE — Telephone Encounter (Signed)
[facility] 

## 2015-02-21 NOTE — Progress Notes (Signed)
Patient ID: Bailey [name], female   DOB: June 26, 1956, 59 y.o.   MRN: [id number]   02/21/2015  Facility:  Nursing Home Location:  [facility] Room Number: 408-P LEVEL OF CARE:  SNF (31)   Chief Complaint  Patient presents with  . Hospitalization Follow-up    Osteoarthritis S/P right total hip arthroplasty, depression, anxiety, hypertension, constipation, GERD and anemia    HISTORY OF PRESENT ILLNESS:  This is a 59 year old female who has [facility] with osteoarthritis S/P right total hip arthroplasty. She has PMH of hypertension, stroke in 2005 with right-sided weakness, anxiety, asthma, goiter removed at age 59 and major depression.  Patient is alert and oriented. She is requesting for her Seroquel to be decreased and to discontinue Percocet. She said that she prefers tramadol over Percocet.  She has been admitted for a short-term rehabilitation.  PAST MEDICAL HISTORY:  Past Medical History  Diagnosis Date  . Hypertension     all BP meds. managed by Dr. Eula ListenHussain    . Stroke     2005, R sided weakness   . Pneumonia     [facility] told that she had pneumonia & was isolated   . Arthritis     OA- R knee, L hip   . Anxiety     "Breakdown" in the 1990's, hosp. for >1 wk. at [facility]    . Incontinence of urine     weak bladder  . Hypertension   . Stroke 2005    ? weakness in hand and arms per mother. patient unsure  . Asthma     years ago. "no problems in a long while."  . Anxiety   . Depression   . GERD (gastroesophageal reflux disease)   . Constipation   . Goiter     removed  at age 59    CURRENT MEDICATIONS: Reviewed per MAR/see medication list  Allergies  Allergen Reactions  . Codeine Nausea Only  . Codeine Nausea Only    Upset stomach, headaches  . Sulfa Antibiotics Nausea Only    Upset stomach, headaches  . Sulfa Drugs Cross Reactors Other (See Comments)   Its been a long time ago     REVIEW OF SYSTEMS:  GENERAL: no change in appetite, no fatigue, no weight changes, no fever, chills or weakness RESPIRATORY: no cough, SOB, DOE, wheezing, hemoptysis CARDIAC: no chest pain, or palpitations GI: no abdominal pain, diarrhea, constipation, heart burn, nausea or vomiting  PHYSICAL EXAMINATION  GENERAL: no acute distress, obese SKIN:  Right hip surgical incision is covered with aquacell, no erythema, dry EYES: conjunctivae normal, sclerae normal, normal eye lids NECK: supple, trachea midline, no neck masses, no thyroid tenderness, no thyromegaly LYMPHATICS: no LAN in the neck, no supraclavicular LAN RESPIRATORY: breathing is even & unlabored, BS CTAB CARDIAC: RRR, no murmur,no extra heart sounds, BLE edema 1+ edema GI: abdomen soft, normal BS, no masses, no tenderness, no hepatomegaly, no splenomegaly EXTREMITIES:  Able to move X 4 extremities PSYCHIATRIC: the patient is alert & oriented to person, affect & behavior appropriate  LABS/RADIOLOGY: Labs reviewed: Basic Metabolic Panel:  Recent Labs  78/29/5604/03/15 1606  NA 136  K 4.0  CL 102  CO2 25  GLUCOSE 101*  BUN 26*  CREATININE 1.44*  CALCIUM 8.8*   CBC:  Recent Labs  02/03/15 1606 02/16/15 0600 02/17/15 0450  WBC 4.2 4.8 8.5  NEUTROABS 1.3*  --   --  HGB 11.8* 8.5* 8.3*  HCT 33.2* 23.8* 22.6*  MCV 81.8 81.0 80.7  PLT 204 174 164    Dg Chest 2 View  02/03/2015   CLINICAL DATA:  59 year old female with a history of scheduled right hip arthroplasty  EXAM: CHEST - 2 VIEW  COMPARISON:  None.  FINDINGS: Cardiomediastinal silhouette projects within normal limits in size and contour. No confluent airspace disease, pneumothorax, or pleural effusion.  No displaced fracture.  Unremarkable appearance of the upper abdomen.  IMPRESSION: No radiographic evidence of acute cardiopulmonary disease.  Signed,  Yvone Neu. Loreta Ave, DO  [facility]  [facility]  [facility]   Electronically Signed   By: Gilmer Mor D.O.   On: 02/03/2015 15:41   Dg Pelvis Portable  02/15/2015   CLINICAL DATA:  Status post right total hip arthroplasty.  EXAM: PORTABLE PELVIS 1-2 VIEWS  COMPARISON:  December 23, 2011.  FINDINGS: Patient is status post interval placement of right total hip arthroplasty. The femoral and acetabular components appear to be well situated. Left total hip arthroplasty noted on prior for exam is stable. No fracture or dislocation is noted.  IMPRESSION: Status post interval placement of right total hip arthroplasty.   Electronically Signed   By: Lupita Raider, M.D.   On: 02/15/2015 16:17    ASSESSMENT/PLAN:  Osteoarthritis S/P right total hip arthroplasty - for rehabilitation; follow-up with Dr. Turner Daniels, orthopedic surgeon, in 2 weeks; continue Tylenol 650 mg 2 tabs = 1300 mg by mouth twice a day when necessary and increase tramadol 50 mg 1 tab by mouth every 4 hours when necessary for pain; Zanaflex 1 tab by mouth 3 times a day for muscle spasm and Coumadin with INR goal 1.5 - 2.5; discontinue Percocet per patient's request Recurrent major depression with psychotic features - continue Celexa 40 mg 1 tab by mouth daily and decrease Seroquel to 25 mg 1 tab by mouth daily at bedtime Generalized anxiety disorder - mood is stable; continue Klonopin 0.5 mg 1 tab by mouth twice a day Hypertension - continue clonidine 0.3 mg 1 tab by mouth twice a day, triamterene HCTZ 37. 5-25 mg 2 tabs by mouth daily, losartan 100 mg by mouth daily and metoprolol 100 mg in 24-hour 1 tablet by mouth every morning; BP/heart rate every shift 1 week Constipation - continue Colace 100 mg 1 capsule by mouth daily when necessary GERD - continue Prilosec 40 mg by mouth every morning Anemia, acute blood loss - hemoglobin 8.3; will monitor   Goals of care:  Short-term rehabilitation   Labs/test ordered:  CBC, BMP   Spent 50 minutes in patient care.    [facility],  NP [employer] [phone]

## 2015-02-21 NOTE — Progress Notes (Signed)
Patient ID: Bailey [name], female   DOB: [birth year], 59 y.o.   MRN: [id number]      [facility]   PCP: Georgann Housekeeper, MD  Code Status: full code  Allergies  Allergen Reactions  . Codeine Nausea Only  . Codeine Nausea Only    Upset stomach, headaches  . Sulfa Antibiotics Nausea Only    Upset stomach, headaches  . Sulfa Drugs Cross Reactors Other (See Comments)    Its been a long time ago    Chief Complaint  Patient presents with  . New Admit To SNF     HPI:  59 year old patient is here for short term rehabilitation post hospital admission from 02/15/15-02/17/15 with primary OA of right hip. She underwent right total hip arthroplasty. Here pain is under control with current regimen. Denies any other concerns. No new concern from staff.  Review of Systems:  Constitutional: Negative for fever, chills, diaphoresis.  HENT: Negative for headache, congestion, difficulty swallowing.   Eyes: Negative for eye pain, blurred vision, double vision and discharge.  Respiratory: Negative for cough, shortness of breath and wheezing.   Cardiovascular: Negative for chest pain, palpitations, leg swelling.  Gastrointestinal: Negative for heartburn, nausea, vomiting, abdominal pain. Had bowel movement yesterday after 5 days and this is normal for her Genitourinary: Negative for dysuria Musculoskeletal: Negative for back pain, falls Skin: Negative for itching, rash.  Neurological: Negative for dizziness, tingling, focal weakness Psychiatric/Behavioral: Negative for depression   Past Medical History  Diagnosis Date  . Hypertension     all BP meds. managed by Dr. Eula Listen    . Stroke     2005, R sided weakness   . Pneumonia     while at [facility]'s told that she had pneumonia & was isolated   . Arthritis     OA- R knee, L hip   . Anxiety     "Breakdown" in the 1990's, hosp. for >1 wk. at [facility]    . Incontinence of urine     weak bladder  .  Hypertension   . Stroke 2005    ? weakness in hand and arms per mother. patient unsure  . Asthma     years ago. "no problems in a long while."  . Anxiety   . Depression   . GERD (gastroesophageal reflux disease)   . Constipation   . Goiter     removed  at age 11   Past Surgical History  Procedure Laterality Date  . Thyroidectomy, partial    . Tubal ligation    . Dilation and curettage of uterus      1980's  . Total knee arthroplasty  10/21/2011    Procedure: TOTAL KNEE ARTHROPLASTY;  Surgeon: Nestor Lewandowsky, MD;  Location: [facility] OR;  Service: Orthopedics;  Laterality: Right;  DEPUY SIGMA RP  . Joint replacement  10/2011    Rt knee  . Total hip arthroplasty  12/23/2011    Procedure: TOTAL HIP ARTHROPLASTY;  Surgeon: Nestor Lewandowsky, MD;  Location: [facility] OR;  Service: Orthopedics;  Laterality: Left;  DEPUY/PINNACLE CUPS/SROM STEM  . Colonoscopy    . Joint replacement Right     knee  . Total hip arthroplasty Left   . Abdominal hysterectomy    . Tonsillectomy    . Goiter      removed age 8  . Total hip arthroplasty Right 02/15/2015    Procedure: TOTAL HIP ARTHROPLASTY;  Surgeon: Gean Birchwood, MD;  Location:  [facility] OR;  Service: Orthopedics;  Laterality: Right;   Social History:   reports that she has never smoked. She does not have any smokeless tobacco history on file. She reports that she drinks alcohol. She reports that she does not use illicit drugs.  Family History  Problem Relation Age of Onset  . Anesthesia problems Neg Hx   . Hypotension Neg Hx   . Malignant hyperthermia Neg Hx   . Pseudochol deficiency Neg Hx   . Alcohol abuse Neg Hx   . Anxiety disorder Neg Hx   . Bipolar disorder Neg Hx   . Depression Neg Hx   . Dementia Neg Hx     Medications: Patient's Medications  New Prescriptions   No medications on file  Previous Medications   ACETAMINOPHEN (TYLENOL) 650 MG CR TABLET    Take 1,300 mg by mouth 2 (two) times daily as needed for pain.   ASPIRIN EC 325 MG TABLET     Take 1 tablet (325 mg total) by mouth 2 (two) times daily.   CHOLECALCIFEROL (VITAMIN D) 2000 UNITS TABLET    Take 2,000 Units by mouth daily.   CITALOPRAM (CELEXA) 40 MG TABLET    Take 40 mg by mouth every morning.    CLONAZEPAM (KLONOPIN) 0.5 MG TABLET    Take 0.5 mg by mouth 2 (two) times daily.   CLONIDINE (CATAPRES) 0.3 MG TABLET    Take 0.3 mg by mouth 2 (two) times daily.   DOCUSATE SODIUM (COLACE) 100 MG CAPSULE    Take 100 mg by mouth daily as needed for mild constipation.   DOXAZOSIN (CARDURA) 8 MG TABLET    Take 8 mg by mouth at bedtime.   FLAXSEED, LINSEED, (FLAX SEED OIL PO)    Take 1,200 mg by mouth daily.   LOSARTAN (COZAAR) 100 MG TABLET    Take 100 mg by mouth daily.   METOPROLOL (TOPROL-XL) 100 MG 24 HR TABLET    Take 100 mg by mouth every morning.    OMEPRAZOLE (PRILOSEC) 40 MG CAPSULE    Take 40 mg by mouth every morning.    OXYCODONE-ACETAMINOPHEN (ROXICET) 5-325 MG PER TABLET    Take 1 tablet by mouth every 4 (four) hours as needed.   POTASSIUM CHLORIDE SA (K-DUR,KLOR-CON) 20 MEQ TABLET    Take 20 mEq by mouth 2 (two) times daily.    QUETIAPINE (SEROQUEL) 50 MG TABLET    Take 1 tablet (50 mg total) by mouth at bedtime.   TETRAHYDROZOLINE HCL (VISINE OP)    Place 1 drop into both eyes daily as needed (dry eyes).   TIZANIDINE (ZANAFLEX) 2 MG CAPSULE    Take 1 capsule (2 mg total) by mouth 3 (three) times daily.   TRAMADOL (ULTRAM) 50 MG TABLET    Take 1 tablet (50 mg total) by mouth every 6 (six) hours as needed for moderate pain.   TRIAMTERENE-HYDROCHLOROTHIAZIDE (MAXZIDE-25) 37.5-25 MG PER TABLET    Take 2 tablets by mouth daily.    VITAMIN C (ASCORBIC ACID) 500 MG TABLET    Take 500 mg by mouth daily.   WARFARIN (COUMADIN) 5 MG TABLET    Take as directed by HHN. INR target 1.5-2.5. D/C after 2 weeks from date of surgery  Modified Medications   No medications on file  Discontinued Medications   No medications on file     Physical Exam: Filed Vitals:   02/21/15 1109    BP: 100/58  Pulse: 63  Temp: 97.7 F (36.5  C)  Resp: 18  SpO2: 97%    General- elderly female, obese, in no acute distress Head- normocephalic, atraumatic Throat- moist mucus membrane Eyes- PERRLA, EOMI, no pallor, no icterus, no discharge, normal conjunctiva, normal sclera Neck- no cervical lymphadenopathy Cardiovascular- normal s1,s2, no murmurs, palpable dorsalis pedis and radial pulses, trace right leg edema Respiratory- bilateral clear to auscultation, no wheeze, no rhonchi, no crackles, no use of accessory muscles Abdomen- bowel sounds present, soft, non tender Musculoskeletal- able to move all 4 extremities, right leg range of motion limited  Neurological- no focal deficit Skin- warm and dry, right hip aquacel dressing in place Psychiatry- alert and oriented to person, place and time, normal mood and affect    Labs reviewed: Basic Metabolic Panel:  Recent Labs  16/06/9604/06/16 1606  NA 136  K 4.0  CL 102  CO2 25  GLUCOSE 101*  BUN 26*  CREATININE 1.44*  CALCIUM 8.8*   CBC:  Recent Labs  02/03/15 1606 02/16/15 0600 02/17/15 0450  WBC 4.2 4.8 8.5  NEUTROABS 1.3*  --   --   HGB 11.8* 8.5* 8.3*  HCT 33.2* 23.8* 22.6*  MCV 81.8 81.0 80.7  PLT 204 174 164    Assessment/Plan  Right hip OA S/p total hip arthroplasty. Will have her work with physical therapy and occupational therapy team to help with gait training and muscle strengthening exercises.fall precautions. Skin care. Encourage to be out of bed. Has f/u with orthopedics. Continue tramadol 50 mg 1 tab q4h prn pain, off percocet. Continue zanaflex 2 mg tid muscle spasm. Continue coumadin for dvt prophylaxis.   Major depressive disorder Stable. Continue celexa 40 mg daily, clonazepam 0.5 mg bid and seroquel 25 mg daily  HTN bp on softer side of normal. On clonidine 0.3 mg bid, cardura 8 mg daily, cozaar 100 mg daily, toprol xl 100 mg daily, maxzide 37.5-25 2 tab daily. bp on review have been on lower side  of normal. Change maxzide to 1 tab daily for now. Check bp q shift and if remains low, consider decreasing her cozaar. Check bmp  Blood loss anemia Post op, monitor h&h  Impaired renal function Reviewed lab from hospital. Check bmp  Slow transit constipation On colace 100 mg daily prn. Change this to bid with her being on tramadol and limited mobility for now  gerd Stable, continue omeprazole 40 mg daily   Goals of care: short term rehabilitation   Labs/tests ordered: cbc, bmp  Family/ staff Communication: reviewed care plan with patient and nursing supervisor    Oneal GroutMAHIMA Khia Dieterich, MD  [facility] [phone] (Monday-Friday 8 am - 5 pm) [phone] (afterhours)

## 2015-02-25 ENCOUNTER — Inpatient Hospital Stay (HOSPITAL_COMMUNITY): Payer: Medicare Other

## 2015-02-25 ENCOUNTER — Inpatient Hospital Stay (HOSPITAL_COMMUNITY)
Admission: EM | Admit: 2015-02-25 | Discharge: 2015-02-28 | DRG: 392 | Disposition: A | Discharge status: Skilled Nursing Facility | Payer: Medicare Other | Attending: Internal Medicine | Admitting: Internal Medicine

## 2015-02-25 ENCOUNTER — Emergency Department (HOSPITAL_COMMUNITY): Payer: Medicare Other

## 2015-02-25 ENCOUNTER — Encounter (HOSPITAL_COMMUNITY): Payer: Self-pay | Admitting: Emergency Medicine

## 2015-02-25 DIAGNOSIS — R509 Fever, unspecified: Secondary | ICD-10-CM | POA: Insufficient documentation

## 2015-02-25 DIAGNOSIS — E860 Dehydration: Secondary | ICD-10-CM

## 2015-02-25 DIAGNOSIS — R112 Nausea with vomiting, unspecified: Secondary | ICD-10-CM | POA: Diagnosis present

## 2015-02-25 DIAGNOSIS — F419 Anxiety disorder, unspecified: Secondary | ICD-10-CM | POA: Diagnosis present

## 2015-02-25 DIAGNOSIS — A084 Viral intestinal infection, unspecified: Secondary | ICD-10-CM | POA: Diagnosis present

## 2015-02-25 DIAGNOSIS — F333 Major depressive disorder, recurrent, severe with psychotic symptoms: Secondary | ICD-10-CM | POA: Diagnosis present

## 2015-02-25 DIAGNOSIS — Z79891 Long term (current) use of opiate analgesic: Secondary | ICD-10-CM

## 2015-02-25 DIAGNOSIS — I10 Essential (primary) hypertension: Secondary | ICD-10-CM | POA: Diagnosis present

## 2015-02-25 DIAGNOSIS — Z8673 Personal history of transient ischemic attack (TIA), and cerebral infarction without residual deficits: Secondary | ICD-10-CM

## 2015-02-25 DIAGNOSIS — Z96643 Presence of artificial hip joint, bilateral: Secondary | ICD-10-CM | POA: Diagnosis present

## 2015-02-25 DIAGNOSIS — Z881 Allergy status to other antibiotic agents status: Secondary | ICD-10-CM

## 2015-02-25 DIAGNOSIS — M1611 Unilateral primary osteoarthritis, right hip: Secondary | ICD-10-CM | POA: Diagnosis not present

## 2015-02-25 DIAGNOSIS — Z7982 Long term (current) use of aspirin: Secondary | ICD-10-CM

## 2015-02-25 DIAGNOSIS — Z96651 Presence of right artificial knee joint: Secondary | ICD-10-CM | POA: Diagnosis present

## 2015-02-25 DIAGNOSIS — K219 Gastro-esophageal reflux disease without esophagitis: Secondary | ICD-10-CM | POA: Diagnosis present

## 2015-02-25 DIAGNOSIS — L8990 Pressure ulcer of unspecified site, unspecified stage: Secondary | ICD-10-CM | POA: Insufficient documentation

## 2015-02-25 DIAGNOSIS — F323 Major depressive disorder, single episode, severe with psychotic features: Secondary | ICD-10-CM | POA: Diagnosis present

## 2015-02-25 DIAGNOSIS — R109 Unspecified abdominal pain: Secondary | ICD-10-CM | POA: Insufficient documentation

## 2015-02-25 DIAGNOSIS — D649 Anemia, unspecified: Secondary | ICD-10-CM | POA: Diagnosis present

## 2015-02-25 DIAGNOSIS — Z888 Allergy status to other drugs, medicaments and biological substances status: Secondary | ICD-10-CM | POA: Diagnosis not present

## 2015-02-25 DIAGNOSIS — E876 Hypokalemia: Secondary | ICD-10-CM | POA: Diagnosis present

## 2015-02-25 DIAGNOSIS — R1084 Generalized abdominal pain: Secondary | ICD-10-CM | POA: Diagnosis not present

## 2015-02-25 DIAGNOSIS — Z7901 Long term (current) use of anticoagulants: Secondary | ICD-10-CM | POA: Diagnosis not present

## 2015-02-25 DIAGNOSIS — R1110 Vomiting, unspecified: Secondary | ICD-10-CM | POA: Diagnosis not present

## 2015-02-25 DIAGNOSIS — Z885 Allergy status to narcotic agent status: Secondary | ICD-10-CM

## 2015-02-25 LAB — COMPREHENSIVE METABOLIC PANEL
ALK PHOS: 74 U/L (ref 38–126)
ALT: 19 U/L (ref 14–54)
AST: 18 U/L (ref 15–41)
Albumin: 3.1 g/dL — ABNORMAL LOW (ref 3.5–5.0)
Anion gap: 12 (ref 5–15)
BILIRUBIN TOTAL: 1 mg/dL (ref 0.3–1.2)
BUN: 11 mg/dL (ref 6–20)
CALCIUM: 8.6 mg/dL — AB (ref 8.9–10.3)
CO2: 31 mmol/L (ref 22–32)
Chloride: 101 mmol/L (ref 101–111)
Creatinine, Ser: 0.92 mg/dL (ref 0.44–1.00)
GFR calc Af Amer: >60 mL/min (ref >60)
GFR calc non Af Amer: >60 mL/min (ref >60)
Glucose, Bld: 120 mg/dL — ABNORMAL HIGH (ref 65–99)
POTASSIUM: 2.1 mmol/L — AB (ref 3.5–5.1)
SODIUM: 144 mmol/L (ref 135–145)
TOTAL PROTEIN: 6.2 g/dL — AB (ref 6.5–8.1)

## 2015-02-25 LAB — PROTIME-INR
INR: 3.17 — AB (ref 0.00–1.49)
PROTHROMBIN TIME: 31.9 s — AB (ref 11.6–15.2)

## 2015-02-25 LAB — CBC WITH DIFFERENTIAL/PLATELET
BASOS ABS: 0 10*3/uL (ref 0.0–0.1)
BASOS PCT: 0 % (ref 0–1)
EOS ABS: 0 10*3/uL (ref 0.0–0.7)
Eosinophils Relative: 0 % (ref 0–5)
HCT: 22.4 % — ABNORMAL LOW (ref 36.0–46.0)
Hemoglobin: 8 g/dL — ABNORMAL LOW (ref 12.0–15.0)
LYMPHS PCT: 14 % (ref 12–46)
Lymphs Abs: 1.1 10*3/uL (ref 0.7–4.0)
MCH: 29.2 pg (ref 26.0–34.0)
MCHC: 35.7 g/dL (ref 30.0–36.0)
MCV: 81.8 fL (ref 78.0–100.0)
MONOS PCT: 11 % (ref 3–12)
Monocytes Absolute: 0.8 10*3/uL (ref 0.1–1.0)
NEUTROS PCT: 75 % (ref 43–77)
Neutro Abs: 5.5 10*3/uL (ref 1.7–7.7)
Platelets: 297 10*3/uL (ref 150–400)
RBC: 2.74 MIL/uL — AB (ref 3.87–5.11)
RDW: 14.6 % (ref 11.5–15.5)
WBC: 7.4 10*3/uL (ref 4.0–10.5)

## 2015-02-25 LAB — URINALYSIS, ROUTINE W REFLEX MICROSCOPIC
BILIRUBIN URINE: NEGATIVE
Glucose, UA: NEGATIVE mg/dL
HGB URINE DIPSTICK: NEGATIVE
Ketones, ur: NEGATIVE mg/dL
LEUKOCYTES UA: NEGATIVE
NITRITE: NEGATIVE
Protein, ur: NEGATIVE mg/dL
SPECIFIC GRAVITY, URINE: 1.044 — AB (ref 1.005–1.030)
Urobilinogen, UA: 2 mg/dL — ABNORMAL HIGH (ref 0.0–1.0)
pH: 7 (ref 5.0–8.0)

## 2015-02-25 LAB — LIPASE, BLOOD: LIPASE: 19 U/L — AB (ref 22–51)

## 2015-02-25 LAB — TSH: TSH: 0.142 u[IU]/mL — ABNORMAL LOW (ref 0.350–4.500)

## 2015-02-25 MED ORDER — POTASSIUM CHLORIDE 10 MEQ/100ML IV SOLN
10.0000 meq | Freq: Once | INTRAVENOUS | Status: AC
  Administered 2015-02-25: 10 meq via INTRAVENOUS

## 2015-02-25 MED ORDER — POTASSIUM CHLORIDE 10 MEQ/100ML IV SOLN
10.0000 meq | Freq: Once | INTRAVENOUS | Status: DC
  Filled 2015-02-25: qty 100

## 2015-02-25 MED ORDER — DOXAZOSIN MESYLATE 8 MG PO TABS
8.0000 mg | ORAL_TABLET | Freq: Every day | ORAL | Status: DC
  Filled 2015-02-25 (×2): qty 1

## 2015-02-25 MED ORDER — CLONIDINE HCL 0.3 MG PO TABS
0.3000 mg | ORAL_TABLET | Freq: Three times a day (TID) | ORAL | Status: DC
  Filled 2015-02-25 (×4): qty 1

## 2015-02-25 MED ORDER — HYDROMORPHONE HCL 1 MG/ML IJ SOLN
1.0000 mg | Freq: Once | INTRAMUSCULAR | Status: AC
  Administered 2015-02-25: 1 mg via INTRAVENOUS
  Filled 2015-02-25: qty 1

## 2015-02-25 MED ORDER — POTASSIUM CHLORIDE 10 MEQ/100ML IV SOLN
10.0000 meq | Freq: Once | INTRAVENOUS | Status: AC
  Administered 2015-02-25: 10 meq via INTRAVENOUS
  Filled 2015-02-25: qty 100

## 2015-02-25 MED ORDER — OXYCODONE HCL 5 MG PO TABS
5.0000 mg | ORAL_TABLET | ORAL | Status: DC | PRN
  Administered 2015-02-25 – 2015-02-28 (×4): 5 mg via ORAL
  Filled 2015-02-25 (×4): qty 1

## 2015-02-25 MED ORDER — CITALOPRAM HYDROBROMIDE 40 MG PO TABS
40.0000 mg | ORAL_TABLET | Freq: Every day | ORAL | Status: DC
  Administered 2015-02-26 – 2015-02-28 (×3): 40 mg via ORAL
  Filled 2015-02-25 (×4): qty 1

## 2015-02-25 MED ORDER — SODIUM CHLORIDE 0.9 % IV SOLN
INTRAVENOUS | Status: DC
  Administered 2015-02-25: 15:00:00 via INTRAVENOUS

## 2015-02-25 MED ORDER — ONDANSETRON HCL 4 MG/2ML IJ SOLN
4.0000 mg | Freq: Once | INTRAMUSCULAR | Status: AC
  Administered 2015-02-25: 4 mg via INTRAVENOUS
  Filled 2015-02-25: qty 2

## 2015-02-25 MED ORDER — QUETIAPINE FUMARATE 50 MG PO TABS
50.0000 mg | ORAL_TABLET | Freq: Every day | ORAL | Status: DC
  Administered 2015-02-26 – 2015-02-27 (×2): 50 mg via ORAL
  Filled 2015-02-25 (×4): qty 1

## 2015-02-25 MED ORDER — ACETAMINOPHEN 325 MG PO TABS
650.0000 mg | ORAL_TABLET | Freq: Four times a day (QID) | ORAL | Status: DC | PRN
Start: 2015-02-25 — End: 2015-02-28

## 2015-02-25 MED ORDER — POTASSIUM CHLORIDE 10 MEQ/100ML IV SOLN
10.0000 meq | INTRAVENOUS | Status: AC
  Administered 2015-02-25 – 2015-02-26 (×4): 10 meq via INTRAVENOUS
  Filled 2015-02-25 (×4): qty 100

## 2015-02-25 MED ORDER — HYDROMORPHONE HCL 1 MG/ML IJ SOLN: 1.0000 mg | INTRAMUSCULAR | Status: DC | PRN

## 2015-02-25 MED ORDER — LOSARTAN POTASSIUM 50 MG PO TABS
100.0000 mg | ORAL_TABLET | Freq: Every day | ORAL | Status: DC
  Filled 2015-02-25 (×2): qty 2

## 2015-02-25 MED ORDER — POTASSIUM CHLORIDE CRYS ER 20 MEQ PO TBCR
40.0000 meq | EXTENDED_RELEASE_TABLET | Freq: Four times a day (QID) | ORAL | Status: AC
  Administered 2015-02-26 (×2): 40 meq via ORAL
  Filled 2015-02-25 (×3): qty 2

## 2015-02-25 MED ORDER — WARFARIN - PHARMACIST DOSING INPATIENT: Freq: Every day | Status: DC

## 2015-02-25 MED ORDER — ONDANSETRON HCL 4 MG/2ML IJ SOLN: 4.0000 mg | Freq: Once | INTRAMUSCULAR | Status: DC

## 2015-02-25 MED ORDER — ACETAMINOPHEN 650 MG RE SUPP: 650.0000 mg | Freq: Four times a day (QID) | RECTAL | Status: DC | PRN

## 2015-02-25 MED ORDER — PROMETHAZINE HCL 25 MG/ML IJ SOLN
12.5000 mg | Freq: Once | INTRAMUSCULAR | Status: AC
  Administered 2015-02-25: 12.5 mg via INTRAVENOUS
  Filled 2015-02-25: qty 1

## 2015-02-25 MED ORDER — ONDANSETRON HCL 4 MG PO TABS: 4.0000 mg | ORAL_TABLET | Freq: Four times a day (QID) | ORAL | Status: DC | PRN

## 2015-02-25 MED ORDER — CLONAZEPAM 0.5 MG PO TABS
0.5000 mg | ORAL_TABLET | Freq: Two times a day (BID) | ORAL | Status: DC
  Administered 2015-02-26 – 2015-02-27 (×4): 0.5 mg via ORAL
  Filled 2015-02-25 (×4): qty 1

## 2015-02-25 MED ORDER — SODIUM CHLORIDE 0.9 % IJ SOLN
3.0000 mL | Freq: Two times a day (BID) | INTRAMUSCULAR | Status: DC
  Administered 2015-02-26 – 2015-02-28 (×4): 3 mL via INTRAVENOUS

## 2015-02-25 MED ORDER — IOHEXOL 300 MG/ML  SOLN
100.0000 mL | Freq: Once | INTRAMUSCULAR | Status: AC | PRN
  Administered 2015-02-25: 100 mL via INTRAVENOUS

## 2015-02-25 MED ORDER — SODIUM CHLORIDE 0.9 % IV BOLUS (SEPSIS)
500.0000 mL | Freq: Once | INTRAVENOUS | Status: AC
  Administered 2015-02-25: 500 mL via INTRAVENOUS

## 2015-02-25 MED ORDER — PROMETHAZINE HCL 25 MG/ML IJ SOLN
12.5000 mg | Freq: Once | INTRAMUSCULAR | Status: AC
  Administered 2015-02-26: 12.5 mg via INTRAVENOUS
  Filled 2015-02-25: qty 1

## 2015-02-25 MED ORDER — SODIUM CHLORIDE 0.9 % IV SOLN
INTRAVENOUS | Status: DC
Start: 2015-02-25 — End: 2015-02-26
  Administered 2015-02-25: 21:00:00 via INTRAVENOUS

## 2015-02-25 MED ORDER — ONDANSETRON HCL 4 MG/2ML IJ SOLN
4.0000 mg | Freq: Four times a day (QID) | INTRAMUSCULAR | Status: DC | PRN
  Administered 2015-02-25 – 2015-02-26 (×4): 4 mg via INTRAVENOUS
  Filled 2015-02-25 (×6): qty 2

## 2015-02-25 MED ORDER — LABETALOL HCL 5 MG/ML IV SOLN
10.0000 mg | INTRAVENOUS | Status: DC | PRN
  Administered 2015-02-25 – 2015-02-26 (×2): 10 mg via INTRAVENOUS
  Filled 2015-02-25 (×4): qty 4

## 2015-02-25 MED ORDER — METOPROLOL SUCCINATE ER 100 MG PO TB24
100.0000 mg | ORAL_TABLET | Freq: Every day | ORAL | Status: DC
  Filled 2015-02-25 (×2): qty 1

## 2015-02-25 MED ORDER — ALUM & MAG HYDROXIDE-SIMETH 200-200-20 MG/5ML PO SUSP: 30.0000 mL | Freq: Four times a day (QID) | ORAL | Status: DC | PRN

## 2015-02-25 NOTE — ED Notes (Signed)
Patient returned from CT

## 2015-02-25 NOTE — ED Notes (Signed)
Pt arrived from [facility] with c/o n/v and abd pain for the past 2-3 days. Pt is at facility for rehab of right hip surgery on May 18. Pt abd pain is mostly in LUQ and pt stated that she has a hernia in that area. Initial BP-202/120 HR-98 NSR Resp-18 EMS administered Zofran 4mg  IV and facility administered Phenergan before leaving facility but pt vomited it up.

## 2015-02-25 NOTE — ED Notes (Signed)
Pt Potassium is 2.1 EDP informed.

## 2015-02-25 NOTE — Progress Notes (Signed)
ANTICOAGULATION CONSULT NOTE - Initial Consult  Pharmacy Consult for warfarin Indication: VTE prophylaxis post Hip surgery on 5/18 (plan was for 2 wks post surgery)  Allergies  Allergen Reactions  . Codeine Nausea Only and Other (See Comments)    headaches  . Sulfa Antibiotics Nausea Only    Upset stomach, headaches  . Sulfa Drugs Cross Reactors Other (See Comments)    Its been a long time ago    Patient Measurements:    Vital Signs: Temp: 99.4 F (37.4 C) (05/28 1420) Temp Source: Oral (05/28 1420) BP: 191/86 mmHg (05/28 1500) Pulse Rate: 94 (05/28 1500)  Labs:  Recent Labs  02/25/15 1503  HGB 8.0*  HCT 22.4*  PLT 297  LABPROT 31.9*  INR 3.17*  CREATININE 0.92    Estimated Creatinine Clearance: 69.4 mL/min (by C-G formula based on Cr of 0.92).   Medical History: Past Medical History  Diagnosis Date  . Hypertension     all BP meds. managed by Dr. Eula ListenHussain    . Stroke     2005, R sided weakness   . Pneumonia     while at [facility] 1990's told that she had pneumonia & was isolated   . Arthritis     OA- R knee, L hip   . Anxiety     "Breakdown" in the 1990's, hosp. for >1 wk. at [facility]    . Incontinence of urine     weak bladder  . Hypertension   . Stroke 2005    ? weakness in hand and arms per mother. patient unsure  . Asthma     years ago. "no problems in a long while."  . Anxiety   . Depression   . GERD (gastroesophageal reflux disease)   . Constipation   . Goiter     removed  at age 59   Assessment: [age] YOF on warfarin for VTE prophylaxis post Hip surgery on 02/15/15. Per discharge note, patient was to only receive 2 weeks of therapy post her hip surgery. This time period would end on 5/31. Pharmacy has been consulted to continue warfarin. INR today is 3.17. H/H low at 8/22.4, Plts 297.   [facility] and last dose per them was 5/26 at a dose of 5.5mg  daily. They have been holding the dose due to INR of 5.3.   Goal  of Therapy:  INR goal 1.5 to 2.5 per prior discharge orders and discussion w/ Dr. Vanessa BarbaraZamora  Monitor platelets by anticoagulation protocol: Yes   Plan:  Hold warfarin.  Follow-up INR.  End date was to be 5/31 - f/up if this needs to remain end date.   Link SnufferJessica Mileidy Atkin, PharmD, BCPS Clinical Pharmacist [phone] 02/25/2015,6:58 PM

## 2015-02-25 NOTE — ED Provider Notes (Signed)
Patient's abdominal CT without acute surgical process. Patient's hypokalemia has been treated by the primary provider. We'll admit to the hospital  Bailey NickAnthony Trace Wirick, MD 02/25/15 [phone]

## 2015-02-25 NOTE — ED Notes (Signed)
Patient transported to CT 

## 2015-02-25 NOTE — ED Notes (Signed)
Patient finished PO contrast

## 2015-02-25 NOTE — ED Provider Notes (Addendum)
CSN: [id number]     Arrival date & time 02/25/15  1403 History   First MD Initiated Contact with Patient 02/25/15 1425     Chief Complaint  Patient presents with  . Abdominal Pain  . Vomiting     (Consider location/radiation/quality/duration/timing/severity/associated sxs/prior Treatment) The history is provided by the patient and the EMS personnel.   patient from [facility] for rehabilitation following hip surgery on the 18th by Dr. Carlean Jews. Surgery to the hip was on the right side. Starting on Thursday patient had left lower quadrant abdominal pain. Is spread throughout the entire abdomen yesterday and worse today. Associated with nausea and vomiting no diarrhea. Not vomiting any blood. Pain is 10 out of 10. Patient known to have a history of an umbilical hernia.  Past Medical History  Diagnosis Date  . Hypertension     all BP meds. managed by Dr. Eula Listen    . Stroke     2005, R sided weakness   . Pneumonia     while at [facility] 1990's told that she had pneumonia & was isolated   . Arthritis     OA- R knee, L hip   . Anxiety     "Breakdown" in the 1990's, hosp. for >1 wk. at [facility]    . Incontinence of urine     weak bladder  . Hypertension   . Stroke 2005    ? weakness in hand and arms per mother. patient unsure  . Asthma     years ago. "no problems in a long while."  . Anxiety   . Depression   . GERD (gastroesophageal reflux disease)   . Constipation   . Goiter     removed  at age 79   Past Surgical History  Procedure Laterality Date  . Thyroidectomy, partial    . Tubal ligation    . Dilation and curettage of uterus      1980's  . Total knee arthroplasty  10/21/2011    Procedure: TOTAL KNEE ARTHROPLASTY;  Surgeon: Nestor Lewandowsky, MD;  Location: [facility] OR;  Service: Orthopedics;  Laterality: Right;  DEPUY SIGMA RP  . Joint replacement  10/2011    Rt knee  . Total hip arthroplasty  12/23/2011    Procedure: TOTAL HIP ARTHROPLASTY;  Surgeon: Nestor Lewandowsky, MD;  Location: [facility] OR;  Service: Orthopedics;  Laterality: Left;  DEPUY/PINNACLE CUPS/SROM STEM  . Colonoscopy    . Joint replacement Right     knee  . Total hip arthroplasty Left   . Abdominal hysterectomy    . Tonsillectomy    . Goiter      removed age 62  . Total hip arthroplasty Right 02/15/2015    Procedure: TOTAL HIP ARTHROPLASTY;  Surgeon: Gean Birchwood, MD;  Location: [facility] OR;  Service: Orthopedics;  Laterality: Right;   Family History  Problem Relation Age of Onset  . Anesthesia problems Neg Hx   . Hypotension Neg Hx   . Malignant hyperthermia Neg Hx   . Pseudochol deficiency Neg Hx   . Alcohol abuse Neg Hx   . Anxiety disorder Neg Hx   . Bipolar disorder Neg Hx   . Depression Neg Hx   . Dementia Neg Hx    History  Substance Use Topics  . Smoking status: Never Smoker   . Smokeless tobacco: Not on file  . Alcohol Use: Yes     Comment: 2x's /month, socially     OB History  Gravida Para Term Preterm AB TAB SAB Ectopic Multiple Living   0 0 0 0 0 0 0 0       Review of Systems  Constitutional: Negative for fever.  HENT: Negative for congestion.   Eyes: Negative for redness.  Respiratory: Negative for shortness of breath.   Cardiovascular: Negative for chest pain.  Gastrointestinal: Positive for nausea, vomiting and abdominal pain. Negative for diarrhea.  Genitourinary: Negative for dysuria.  Musculoskeletal: Negative for back pain.  Skin: Negative for rash.  Neurological: Negative for headaches.  Hematological: Does not bruise/bleed easily.  Psychiatric/Behavioral: Negative for confusion.      Allergies  Codeine; Codeine; Sulfa antibiotics; and Sulfa drugs cross reactors  Home Medications   Prior to Admission medications   Medication Sig Start Date End Date Taking? Authorizing Provider  acetaminophen (TYLENOL) 650 MG CR tablet Take 1,300 mg by mouth 2 (two) times daily as needed for pain.    Historical Provider, MD  aspirin EC 325 MG tablet Take 1  tablet (325 mg total) by mouth 2 (two) times daily. 02/17/15   Allena KatzEric K Phillips, PA-C  Cholecalciferol (VITAMIN D) 2000 UNITS tablet Take 2,000 Units by mouth daily.    Historical Provider, MD  citalopram (CELEXA) 40 MG tablet Take 40 mg by mouth every morning.     Historical Provider, MD  clonazePAM (KLONOPIN) 0.5 MG tablet Take 0.5 mg by mouth 2 (two) times daily.    Historical Provider, MD  cloNIDine (CATAPRES) 0.3 MG tablet Take 0.3 mg by mouth 2 (two) times daily.    Historical Provider, MD  docusate sodium (COLACE) 100 MG capsule Take 100 mg by mouth daily as needed for mild constipation.    Historical Provider, MD  doxazosin (CARDURA) 8 MG tablet Take 8 mg by mouth at bedtime.    Historical Provider, MD  Flaxseed, Linseed, (FLAX SEED OIL PO) Take 1,200 mg by mouth daily.    Historical Provider, MD  losartan (COZAAR) 100 MG tablet Take 100 mg by mouth daily.    Historical Provider, MD  metoprolol (TOPROL-XL) 100 MG 24 hr tablet Take 100 mg by mouth every morning.     Historical Provider, MD  omeprazole (PRILOSEC) 40 MG capsule Take 40 mg by mouth every morning.     Historical Provider, MD  oxyCODONE-acetaminophen (ROXICET) 5-325 MG per tablet Take 1 tablet by mouth every 4 (four) hours as needed. 02/17/15   Allena KatzEric K Phillips, PA-C  potassium chloride SA (K-DUR,KLOR-CON) 20 MEQ tablet Take 20 mEq by mouth 2 (two) times daily.     Historical Provider, MD  QUEtiapine (SEROQUEL) 50 MG tablet Take 1 tablet (50 mg total) by mouth at bedtime. 09/01/14 09/01/15  Oletta DarterSalina Agarwal, MD  Tetrahydrozoline HCl (VISINE OP) Place 1 drop into both eyes daily as needed (dry eyes).    Historical Provider, MD  tizanidine (ZANAFLEX) 2 MG capsule Take 1 capsule (2 mg total) by mouth 3 (three) times daily. 02/17/15   Allena KatzEric K Phillips, PA-C  traMADol Janean Sark(ULTRAM) 50 MG tablet Take one tablet by mouth every 4 hours as needed for pain 02/21/15   Sharon SellerJessica K Eubanks, NP  triamterene-hydrochlorothiazide (MAXZIDE-25) 37.5-25 MG per tablet  Take 2 tablets by mouth daily.     Historical Provider, MD  vitamin C (ASCORBIC ACID) 500 MG tablet Take 500 mg by mouth daily.    Historical Provider, MD  warfarin (COUMADIN) 5 MG tablet Take as directed by HHN. INR target 1.5-2.5. D/C after 2 weeks from date of surgery  12/26/11 12/25/12  Gean Birchwood, MD   BP 179/95 mmHg  Pulse 94  Temp(Src) 99.4 F (37.4 C) (Oral)  Resp 18  SpO2 97% Physical Exam  Constitutional: She is oriented to person, place, and time. She appears well-developed and well-nourished. No distress.  HENT:  Head: Normocephalic and atraumatic.  Next membranes are dry.  Eyes: Conjunctivae and EOM are normal. Pupils are equal, round, and reactive to light.  Neck: Normal range of motion.  Cardiovascular: Normal rate and regular rhythm.   No murmur heard. Pulmonary/Chest: Effort normal. No respiratory distress.  Abdominal: Soft. Bowel sounds are normal. There is no tenderness.  No palpable umbilical mass.  Musculoskeletal: Normal range of motion.  Neurological: She is alert and oriented to person, place, and time. No cranial nerve deficit. She exhibits normal muscle tone. Coordination normal.  Skin: Skin is warm. No rash noted.  Nursing note and vitals reviewed.   ED Course  Procedures (including critical care time) Labs Review Labs Reviewed  CBC WITH DIFFERENTIAL/PLATELET  COMPREHENSIVE METABOLIC PANEL  LIPASE, BLOOD  PROTIME-INR    Imaging Review No results found.   EKG Interpretation None      CRITICAL CARE Performed by: Vanetta Mulders Total critical care time: 30 Critical care time was exclusive of separately billable procedures and treating other patients. Critical care was necessary to treat or prevent imminent or life-threatening deterioration. Critical care was time spent personally by me on the following activities: development of treatment plan with patient and/or surrogate as well as nursing, discussions with consultants, evaluation of  patient's response to treatment, examination of patient, obtaining history from patient or surrogate, ordering and performing treatments and interventions, ordering and review of laboratory studies, ordering and review of radiographic studies, pulse oximetry and re-evaluation of patient's condition.    MDM   Final diagnoses:  Abdominal pain    Patient in rehabilitation following the right hip surgery. That was done on the 18th. Done by Dr. Carlean Jews. Starting on Thursday patient started with abdominal pain left lower quadrant now all over. Patient is known to have a history of an umbilical hernia. Symptoms are associated with persistent nausea and vomiting no diarrhea.  Patient has CT abdomen ordered. Will hydrate. Patient's pain controlled with hydromorphone. Antinausea medicine given. Dispositional be based on lab results and CT abdomen pelvis. Main concern is ruling out an incarcerated umbilical hernia. Patient has not had any abdominal surgery in the past. Other than tubal ligation.    Vanetta Mulders, MD 02/25/15 1525  Vanetta Mulders, MD 02/25/15 1528   Patient's electrolytes show normal kidney function but significant hypokalemia. Potassium 2.1. IV potassium 10 mEq 2 ordered. Patient will require admission for this alone. However CT abdomen still significant to rule out an incarcerated hernia or intra-abdominal process as the cause for the pain and now persistent vomiting.  Vanetta Mulders, MD 02/25/15 [phone]

## 2015-02-25 NOTE — H&P (Signed)
[facility] History and Physical  Bailey [name]:[id number] DOB: 1956-08-11 DOA: 02/25/2015  Referring physician:  PCP: Georgann Housekeeper, MD   Chief Complaint: Nausea/vomiting  HPI: Bailey [name] is a 59 y.o. female with a past medical history of hypertension, osteoarthritis, who was recently admitted to the orthopedic service from 02/15/2015 through 02/17/2015 at which time she underwent an elective total right hip arthroplasty, procedure performed on 02/15/2015 by Dr. Turner Daniels. There were no immediate complications, patient was discharged to [facility] skilled nursing facility for acute rehabilitation. She was discharged on Coumadin for DVT prophylaxis with INR goal of 1.5-2.5. She was referred to the emergency department today after having 3 days of nausea vomiting associated with abdominal pain. She denies hematemesis, melanoma, bloody stools as well as diarrhea. She also denies fevers, chills, chest pain, shortness of breath, dysuria, hematuria. Lab work in the emergency department revealed a potassium of 2.1. Kidney function was stable with creatinine of 0.92 and BUN of 11. CT scan of abdomen and pelvis did not reveal acute intra-abdominal process.                                                                                                            Review of Systems:  Constitutional:  No weight loss, night sweats, Fevers, chills, fatigue, positive for generalized weakness, poor tolerance to physical exertion  HEENT:  No headaches, Difficulty swallowing,Tooth/dental problems,Sore throat,  No sneezing, itching, ear ache, nasal congestion, post nasal drip,  Cardio-vascular:  No chest pain, Orthopnea, PND, swelling in lower extremities, anasarca, dizziness, palpitations  GI:  No heartburn, indigestion, positive for abdominal pain, nausea, vomiting, denies diarrhea, change in bowel habits, loss of appetite  Resp:  No shortness of breath with exertion or at rest. No excess  mucus, no productive cough, No non-productive cough, No coughing up of blood.No change in color of mucus.No wheezing.No chest wall deformity  Skin:  no rash or lesions.  GU:  no dysuria, change in color of urine, no urgency or frequency. No flank pain.  Musculoskeletal:  No joint pain or swelling. No decreased range of motion. No back pain.  Psych:  No change in mood or affect. No depression or anxiety. No memory loss.   Past Medical History  Diagnosis Date  . Hypertension     all BP meds. managed by Dr. Eula Listen    . Stroke     2005, R sided weakness   . Pneumonia     while at [facility].- 1990's told that she had pneumonia & was isolated   . Arthritis     OA- R knee, L hip   . Anxiety     "Breakdown" in the 1990's, hosp. for >1 wk. at [facility]    . Incontinence of urine     weak bladder  . Hypertension   . Stroke 2005    ? weakness in hand and arms per mother. patient unsure  . Asthma     years ago. "no problems in a long while."  . Anxiety   .  Depression   . GERD (gastroesophageal reflux disease)   . Constipation   . Goiter     removed  at age 59   Past Surgical History  Procedure Laterality Date  . Thyroidectomy, partial    . Tubal ligation    . Dilation and curettage of uterus      1980's  . Total knee arthroplasty  10/21/2011    Procedure: TOTAL KNEE ARTHROPLASTY;  Surgeon: Nestor LewandowskyFrank J Rowan, MD;  Location: [facility] OR;  Service: Orthopedics;  Laterality: Right;  DEPUY SIGMA RP  . Joint replacement  10/2011    Rt knee  . Total hip arthroplasty  12/23/2011    Procedure: TOTAL HIP ARTHROPLASTY;  Surgeon: Nestor LewandowskyFrank J Rowan, MD;  Location: [facility] OR;  Service: Orthopedics;  Laterality: Left;  DEPUY/PINNACLE CUPS/SROM STEM  . Colonoscopy    . Joint replacement Right     knee  . Total hip arthroplasty Left   . Abdominal hysterectomy    . Tonsillectomy    . Goiter      removed age 59  . Total hip arthroplasty Right 02/15/2015    Procedure: TOTAL HIP ARTHROPLASTY;  Surgeon: Gean BirchwoodFrank  Rowan, MD;  Location: [facility] OR;  Service: Orthopedics;  Laterality: Right;   Social History:  reports that she has never smoked. She does not have any smokeless tobacco history on file. She reports that she drinks alcohol. She reports that she does not use illicit drugs.  Allergies  Allergen Reactions  . Codeine Nausea Only and Other (See Comments)    headaches  . Sulfa Antibiotics Nausea Only    Upset stomach, headaches  . Sulfa Drugs Cross Reactors Other (See Comments)    Its been a long time ago    Family History  Problem Relation Age of Onset  . Anesthesia problems Neg Hx   . Hypotension Neg Hx   . Malignant hyperthermia Neg Hx   . Pseudochol deficiency Neg Hx   . Alcohol abuse Neg Hx   . Anxiety disorder Neg Hx   . Bipolar disorder Neg Hx   . Depression Neg Hx   . Dementia Neg Hx     Prior to Admission medications   Medication Sig Start Date End Date Taking? Authorizing Provider  acetaminophen (TYLENOL) 325 MG tablet Take 1,300 mg by mouth 2 (two) times daily as needed (pain).   Yes Historical Provider, MD  aspirin EC 325 MG tablet Take 1 tablet (325 mg total) by mouth 2 (two) times daily. 02/17/15  Yes Allena KatzEric K Phillips, PA-C  Cholecalciferol (VITAMIN D) 2000 UNITS tablet Take 2,000 Units by mouth daily.   Yes Historical Provider, MD  citalopram (CELEXA) 40 MG tablet Take 40 mg by mouth daily.    Yes Historical Provider, MD  clonazePAM (KLONOPIN) 0.5 MG tablet Take 0.5 mg by mouth 2 (two) times daily.   Yes Historical Provider, MD  cloNIDine (CATAPRES) 0.3 MG tablet Take 0.3 mg by mouth 2 (two) times daily.   Yes Historical Provider, MD  doxazosin (CARDURA) 8 MG tablet Take 8 mg by mouth at bedtime.   Yes Historical Provider, MD  Flaxseed, Linseed, (FLAX SEED OIL PO) Take 1,200 mg by mouth daily.   Yes Historical Provider, MD  losartan (COZAAR) 100 MG tablet Take 100 mg by mouth daily.   Yes Historical Provider, MD  metoprolol (TOPROL-XL) 100 MG 24 hr tablet Take 100 mg by mouth  daily.    Yes Historical Provider, MD  omeprazole (PRILOSEC) 40 MG capsule Take 40  mg by mouth daily before breakfast.    Yes Historical Provider, MD  potassium chloride SA (K-DUR,KLOR-CON) 20 MEQ tablet Take 20 mEq by mouth 2 (two) times daily.    Yes Historical Provider, MD  promethazine (PHENERGAN) 25 MG tablet Take 25 mg by mouth every 6 (six) hours as needed for nausea or vomiting.   Yes Historical Provider, MD  QUEtiapine (SEROQUEL) 50 MG tablet Take 1 tablet (50 mg total) by mouth at bedtime. 09/01/14 09/01/15 Yes Oletta Darter, MD  Tetrahydrozoline HCl (VISINE OP) Place 1 drop into both eyes daily as needed (dry eyes).   Yes Historical Provider, MD  tizanidine (ZANAFLEX) 2 MG capsule Take 1 capsule (2 mg total) by mouth 3 (three) times daily. 02/17/15  Yes Allena Katz, PA-C  traMADol (ULTRAM) 50 MG tablet Take one tablet by mouth every 4 hours as needed for pain Patient taking differently: Take 50 mg by mouth every 4 (four) hours as needed for moderate pain.  02/21/15  Yes Sharon Seller, NP  triamterene-hydrochlorothiazide (MAXZIDE-25) 37.5-25 MG per tablet Take 2 tablets by mouth every evening. Daily at 1700   Yes Historical Provider, MD  vitamin C (ASCORBIC ACID) 500 MG tablet Take 500 mg by mouth daily.   Yes Historical Provider, MD  VITAMIN K PO Take 2.5 mg by mouth once.   Yes Historical Provider, MD  oxyCODONE-acetaminophen (ROXICET) 5-325 MG per tablet Take 1 tablet by mouth every 4 (four) hours as needed. Patient not taking: Reported on 02/25/2015 02/17/15   Allena Katz, PA-C  warfarin (COUMADIN) 5 MG tablet Take as directed by [facility]. INR target 1.5-2.5. D/C after 2 weeks from date of surgery 12/26/11 12/25/12  Gean Birchwood, MD  Warfarin Sodium (COUMADIN PO) Take 5.5 mg by mouth daily at 6 PM.    Historical Provider, MD   Physical Exam: Filed Vitals:   02/25/15 1420 02/25/15 1500  BP: 179/95 191/86  Pulse: 94 94  Temp: 99.4 F (37.4 C)   TempSrc: Oral   Resp: 18 16  SpO2:  97% 99%    Wt Readings from Last 3 Encounters:  02/20/15 93.895 kg (207 lb)  02/15/15 94.121 kg (207 lb 8 oz)  02/03/15 90.399 kg (199 lb 4.7 oz)    General:  Ill-appearing though no acute distress, she is awake and alert, following commands Eyes: PERRL, normal lids, irises & conjunctiva ENT: grossly normal hearing, lips & tongue Neck: no LAD, masses or thyromegaly Cardiovascular: RRR, she has 3/6 systolic ejection murmur, no shrimp any edema noted Telemetry: SR, no arrhythmias  Respiratory: CTA bilaterally, no w/r/r. Normal respiratory effort. Abdomen: soft, having generalized pain to palpation, no guarding or rebound tenderness, had positive bowel sounds to all 4 quadrants Skin: no rash or induration seen on limited exam Musculoskeletal: grossly normal tone BUE/BLE Psychiatric: grossly normal mood and affect, speech fluent and appropriate Neurologic: grossly non-focal.          Labs on Admission:  Basic Metabolic Panel:  Recent Labs Lab 02/25/15 1503  NA 144  K 2.1*  CL 101  CO2 31  GLUCOSE 120*  BUN 11  CREATININE 0.92  CALCIUM 8.6*   Liver Function Tests:  Recent Labs Lab 02/25/15 1503  AST 18  ALT 19  ALKPHOS 74  BILITOT 1.0  PROT 6.2*  ALBUMIN 3.1*    Recent Labs Lab 02/25/15 1503  LIPASE 19*   No results for input(s): AMMONIA in the last 168 hours. CBC:  Recent Labs Lab 02/25/15 1503  WBC 7.4  NEUTROABS 5.5  HGB 8.0*  HCT 22.4*  MCV 81.8  PLT 297   Cardiac Enzymes: No results for input(s): CKTOTAL, CKMB, CKMBINDEX, TROPONINI in the last 168 hours.  BNP (last 3 results) No results for input(s): BNP in the last 8760 hours.  ProBNP (last 3 results) No results for input(s): PROBNP in the last 8760 hours.  CBG: No results for input(s): GLUCAP in the last 168 hours.  Radiological Exams on Admission: Ct Abdomen Pelvis W Contrast  02/25/2015   CLINICAL DATA:  Diffuse abdominal pain, right hip surgery 02/15/2015  EXAM: CT ABDOMEN AND  PELVIS WITH CONTRAST  TECHNIQUE: Multidetector CT imaging of the abdomen and pelvis was performed using the standard protocol following bolus administration of intravenous contrast.  CONTRAST:  OMNIPAQUE IOHEXOL 300 MG/ML  SOLN  COMPARISON:  12/02/2008  FINDINGS: Sagittal images of the spine shows mild degenerative changes thoracolumbar spine. There is mild about 3 mm anterolisthesis L4 on L5 vertebral body. Facet degenerative changes noted L3-L4 and L5 level.  The lung bases are unremarkable. Enhanced liver is unremarkable. Enhanced pancreas, spleen and adrenal glands are unremarkable. There is mild bilateral lobulated renal contour. Multifocal cortical scarring noted within right kidney. No hydronephrosis or hydroureter. Delayed renal images shows bilateral renal symmetrical excretion.  No aortic aneurysm.  No small bowel obstruction. No ascites or free air. No adenopathy. There is a small left paramedian supraumbilical hernia containing omental fat and small amount of fluid measures about 2 by 1.6 cm. There is no evidence of acute complication.  No pericecal inflammation. The terminal ileum is unremarkable. Normal appendix is partially visualized in axial image 54  Moderate stool noted in proximal sigmoid colon. Evaluation of the pelvis is markedly limited by extensive metallic artifacts from bilateral hip prosthesis.  The patient is status post hysterectomy. There is a subcutaneous fluid collection just lateral to right hip joint measures about 4.4 by 3.4 cm. This most likely is postsurgical in nature. No definite evidence of abscess. The urinary bladder is unremarkable. Mild degenerative changes pubic symphysis.  IMPRESSION: 1. There is no evidence of acute inflammatory process within abdomen or pelvis. 2. There is a small left paramedian supraumbilical hernia containing omental fat and small amount of fluid without evidence of acute complication. 3. Normal appendix.  No pericecal inflammation. 4.  Bilateral lobulated renal contour. Probable cortical scarring within right kidney. Bilateral renal symmetrical excretion. No hydronephrosis or hydroureter. 5. Small amount of subcutaneous fluid is noted just lateral to right hip joint probable postsurgical in nature. No definite evidence of abscess. 6. Degenerative changes lumbar spine as described above.   Electronically Signed   By: Natasha Mead M.D.   On: 02/25/2015 17:48    EKG: Independently reviewed.   Assessment/Plan Principal Problem:   Hypokalemia Active Problems:   Nausea & vomiting   Severe recurrent major depressive disorder with psychotic features   Primary osteoarthritis of right hip   1. Hypokalemia. Patient presenting as a transfer from skilled nursing facility having a 2 to three-day history of intractable nausea and vomiting. Lab work in the emergency department revealing a potassium of 2.1. Hypokalemia likely secondary to GI losses. She was given 10 mEq of IV potassium chloride in the emergency department, will provide for additional runs of IV potassium in addition to K Doerr 40 mEq by mouth 4 doses every 6 hours. This was reviewed with pharmacy.  2. Nausea/vomiting. Patient presenting with intractable nausea vomiting which I suspect could be related  to viral syndrome. Workup in the emergency department included a CT scan of abdomen and pelvis that did not reveal acute intra-abdominal pathology. Specifically she did not have obstructive process. She denies hematemesis or bloody stools. Will provide IV fluid resuscitation, correct electrolytes, IV antiemetic therapy as needed, supportive care. 3. Hypertension. Patient presenting with elevated blood pressures, having systolic blood pressures in the 180s to 190s. I suspect this is related to not taking her by mouth antihypertensives agents secondary to intractable nausea and vomiting. Will provide IV labetalol 10 mg every 2 hours as needed for systolic blood pressures greater than  165. Restart patient's antihypertensives agents as tolerated. 4. Osteoarthritis of right hip. Patient recently discharged from the orthopedic service where she underwent right hip arthroplasty. She had been undergoing acute rehabilitation at skilled nursing facility. Patient had also been on Coumadin therapy for DVT prophylaxis having INR goal of 1.5 to 2.5 per orthopedic notes. Nursing home notes showing she had a supratherapeutic INR around 5 for which she was recently administered vitamin K. 5. Supratherapeutic INR. As stated above patient having INR of 5 at her skilled nursing facility where she was administered vitamin K. Lab work today showing INR of 3.17. Will consult pharmacy for Coumadin dosing for goal INR of 1.5-2.5 as orthopedic surgery had recommended DVT prophylaxis with Coumadin for 2 weeks postsurgery. 6. Major depression. Patient having asked history of severe recurrent major depressive disorder with psychotic features, will continue her citalopram and Seroquel 7. DVT prophylaxis. Patient anticoagulated with warfarin   Code Status: Full code Family Communication: Family members not present Disposition Plan: Will admit patient to the inpatient service, I anticipate she will require greater than 2 nights hospitalization  Time spent: 70 min  Jeralyn Bennett [facility] Pager [phone]

## 2015-02-25 NOTE — Progress Notes (Signed)
Attempted to call ED for report at [phone].  No answer.  Will call back.  Owens & MinorKimberly Alizeh Madril RN-BC, WTA.

## 2015-02-26 DIAGNOSIS — R1110 Vomiting, unspecified: Secondary | ICD-10-CM

## 2015-02-26 DIAGNOSIS — R112 Nausea with vomiting, unspecified: Secondary | ICD-10-CM

## 2015-02-26 DIAGNOSIS — E860 Dehydration: Secondary | ICD-10-CM

## 2015-02-26 DIAGNOSIS — R509 Fever, unspecified: Secondary | ICD-10-CM | POA: Insufficient documentation

## 2015-02-26 DIAGNOSIS — E876 Hypokalemia: Secondary | ICD-10-CM

## 2015-02-26 LAB — COMPREHENSIVE METABOLIC PANEL
ALBUMIN: 2.8 g/dL — AB (ref 3.5–5.0)
ALK PHOS: 66 U/L (ref 38–126)
ALT: 17 U/L (ref 14–54)
ANION GAP: 9 (ref 5–15)
AST: 17 U/L (ref 15–41)
BUN: 8 mg/dL (ref 6–20)
CALCIUM: 7.9 mg/dL — AB (ref 8.9–10.3)
CO2: 29 mmol/L (ref 22–32)
CREATININE: 0.89 mg/dL (ref 0.44–1.00)
Chloride: 106 mmol/L (ref 101–111)
GFR calc Af Amer: >60 mL/min (ref >60)
GFR calc non Af Amer: >60 mL/min (ref >60)
GLUCOSE: 108 mg/dL — AB (ref 65–99)
Potassium: 2.6 mmol/L — CL (ref 3.5–5.1)
Sodium: 144 mmol/L (ref 135–145)
Total Bilirubin: 1.2 mg/dL (ref 0.3–1.2)
Total Protein: 5.4 g/dL — ABNORMAL LOW (ref 6.5–8.1)

## 2015-02-26 LAB — CBC
HEMATOCRIT: 19.7 % — AB (ref 36.0–46.0)
HEMOGLOBIN: 7 g/dL — AB (ref 12.0–15.0)
MCH: 29.4 pg (ref 26.0–34.0)
MCHC: 35.5 g/dL (ref 30.0–36.0)
MCV: 82.8 fL (ref 78.0–100.0)
Platelets: 273 10*3/uL (ref 150–400)
RBC: 2.38 MIL/uL — AB (ref 3.87–5.11)
RDW: 15 % (ref 11.5–15.5)
WBC: 7.5 10*3/uL (ref 4.0–10.5)

## 2015-02-26 MED ORDER — SODIUM CHLORIDE 0.9 % IV SOLN
INTRAVENOUS | Status: DC
  Administered 2015-02-26 – 2015-02-27 (×3): via INTRAVENOUS
  Filled 2015-02-26 (×8): qty 1000

## 2015-02-26 MED ORDER — METOPROLOL TARTRATE 1 MG/ML IV SOLN
5.0000 mg | Freq: Four times a day (QID) | INTRAVENOUS | Status: DC
  Administered 2015-02-26 – 2015-02-28 (×7): 5 mg via INTRAVENOUS
  Filled 2015-02-26 (×12): qty 5

## 2015-02-26 MED ORDER — HYDRALAZINE HCL 20 MG/ML IJ SOLN
10.0000 mg | Freq: Four times a day (QID) | INTRAMUSCULAR | Status: DC | PRN
  Filled 2015-02-26: qty 1

## 2015-02-26 MED ORDER — METHOCARBAMOL 1000 MG/10ML IJ SOLN
500.0000 mg | Freq: Three times a day (TID) | INTRAVENOUS | Status: DC | PRN
  Administered 2015-02-26: 500 mg via INTRAVENOUS
  Filled 2015-02-26 (×4): qty 5

## 2015-02-26 MED ORDER — POTASSIUM CHLORIDE 10 MEQ/100ML IV SOLN
10.0000 meq | INTRAVENOUS | Status: AC
  Administered 2015-02-26 (×5): 10 meq via INTRAVENOUS
  Filled 2015-02-26 (×5): qty 100

## 2015-02-26 MED ORDER — POTASSIUM CHLORIDE 10 MEQ/100ML IV SOLN
10.0000 meq | INTRAVENOUS | Status: DC
  Filled 2015-02-26 (×3): qty 100

## 2015-02-26 MED ORDER — HYDROMORPHONE HCL 1 MG/ML IJ SOLN
1.0000 mg | INTRAMUSCULAR | Status: DC | PRN
  Administered 2015-02-26 – 2015-02-28 (×7): 1 mg via INTRAVENOUS
  Filled 2015-02-26 (×8): qty 1

## 2015-02-26 NOTE — Progress Notes (Signed)
PROGRESS NOTE  Bailey [name]:[id number] DOB: 06-08-1956 DOA: 02/25/2015 PCP: Georgann Housekeeper, MD  Brief history 59 year old female with a history of hypertension, anxiety, and 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 Turner Daniels) presented with 3 days of intractable abdominal pain, nausea, vomiting. The patient was recently discharged to [facility] on 02/17/2015 after her right total hip arthroplasty. Besides her opiate medications and warfarin for DVT prophylaxis, the patient has not been started on any new medications. She stated that she had a fever up to 102.35F on the day prior to admission at [facility]. She denied any headaches, neck pain, coughing, hematemesis, hematochezia, melena, dysuria, hematuria. She denies any recent sick contacts or unusual foods. Upon admission, she was noted to have potassium 2.1. The patient was started on IV fluids and antiemetics.  Assessment/Plan: Intractable nausea and vomiting -Suspect viral gastritis -Continue symptomatic treatment with PPI, antiemetics, IV fluids -02/25/2015 CT abdomen and pelvis negative for any acute findings; only showed moderate stool in the sigmoid colon Fever -Urinalysis negative for pyuria -Chest x-Aldous negative for infiltrates -Blood cultures 2 sets obtained on the evening of 02/25/2015 -Hold antibiotics for now as the patient is hemodynamically stable -Right hip without erythema or purulent drainage Hypokalemia/dehydration -Secondary to insensible loss from her fever as well as GI loss -Replete potassium and continue IV fluids -Add potassium to maintenance fluids -Check magnesium Hypertension -Patient is unable to tolerate oral medications at this time -Treat patient with IV lopressor for her blood pressure -hydralazine prn SBP>180 S/p R-THA -site without erythema or purulent drainange -clear serous drainage noted Depression with psychotic features -continue celexa and seroquel if  able to tolerate po    Family Communication:   No family at beside Disposition Plan:   SNF in 1-2 days      Procedures/Studies: Dg Chest 2 View  02/03/2015   CLINICAL DATA:  59 year old female with a history of scheduled right hip arthroplasty  EXAM: CHEST - 2 VIEW  COMPARISON:  None.  FINDINGS: Cardiomediastinal silhouette projects within normal limits in size and contour. No confluent airspace disease, pneumothorax, or pleural effusion.  No displaced fracture.  Unremarkable appearance of the upper abdomen.  IMPRESSION: No radiographic evidence of acute cardiopulmonary disease.  Signed,  Yvone Neu. Loreta Ave, DO  [facility]  [facility]   Electronically Signed   By: Gilmer Mor D.O.   On: 02/03/2015 15:41   Ct Abdomen Pelvis W Contrast  02/25/2015   CLINICAL DATA:  Diffuse abdominal pain, right hip surgery 02/15/2015  EXAM: CT ABDOMEN AND PELVIS WITH CONTRAST  TECHNIQUE: Multidetector CT imaging of the abdomen and pelvis was performed using the standard protocol following bolus administration of intravenous contrast.  CONTRAST:  OMNIPAQUE IOHEXOL 300 MG/ML  SOLN  COMPARISON:  12/02/2008  FINDINGS: Sagittal images of the spine shows mild degenerative changes thoracolumbar spine. There is mild about 3 mm anterolisthesis L4 on L5 vertebral body. Facet degenerative changes noted L3-L4 and L5 level.  The lung bases are unremarkable. Enhanced liver is unremarkable. Enhanced pancreas, spleen and adrenal glands are unremarkable. There is mild bilateral lobulated renal contour. Multifocal cortical scarring noted within right kidney. No hydronephrosis or hydroureter. Delayed renal images shows bilateral renal symmetrical excretion.  No aortic aneurysm.  No small bowel obstruction. No ascites or free air. No adenopathy. There is a small left paramedian supraumbilical hernia containing omental fat and small amount of fluid measures about 2  by 1.6 cm. There is no  evidence of acute complication.  No pericecal inflammation. The terminal ileum is unremarkable. Normal appendix is partially visualized in axial image 54  Moderate stool noted in proximal sigmoid colon. Evaluation of the pelvis is markedly limited by extensive metallic artifacts from bilateral hip prosthesis.  The patient is status post hysterectomy. There is a subcutaneous fluid collection just lateral to right hip joint measures about 4.4 by 3.4 cm. This most likely is postsurgical in nature. No definite evidence of abscess. The urinary bladder is unremarkable. Mild degenerative changes pubic symphysis.  IMPRESSION: 1. There is no evidence of acute inflammatory process within abdomen or pelvis. 2. There is a small left paramedian supraumbilical hernia containing omental fat and small amount of fluid without evidence of acute complication. 3. Normal appendix.  No pericecal inflammation. 4. Bilateral lobulated renal contour. Probable cortical scarring within right kidney. Bilateral renal symmetrical excretion. No hydronephrosis or hydroureter. 5. Small amount of subcutaneous fluid is noted just lateral to right hip joint probable postsurgical in nature. No definite evidence of abscess. 6. Degenerative changes lumbar spine as described above.   Electronically Signed   By: Natasha Mead M.D.   On: 02/25/2015 17:48   Dg Pelvis Portable  02/15/2015   CLINICAL DATA:  Status post right total hip arthroplasty.  EXAM: PORTABLE PELVIS 1-2 VIEWS  COMPARISON:  December 23, 2011.  FINDINGS: Patient is status post interval placement of right total hip arthroplasty. The femoral and acetabular components appear to be well situated. Left total hip arthroplasty noted on prior for exam is stable. No fracture or dislocation is noted.  IMPRESSION: Status post interval placement of right total hip arthroplasty.   Electronically Signed   By: Lupita Raider, M.D.   On: 02/15/2015 16:17   Dg Chest Port 1 View  02/25/2015   CLINICAL DATA:   Postoperative fever  EXAM: PORTABLE CHEST - 1 VIEW  COMPARISON:  08/18/2012  FINDINGS: Cardiac shadow is stable. No focal infiltrate is seen. Some scarring is again noted in the left lung base no acute bony abnormality is noted.  IMPRESSION: No active disease.   Electronically Signed   By: Alcide Clever M.D.   On: 02/25/2015 21:48         Subjective: Patient continues to have some nausea and a small amount of emesis this morning. Abdominal pain is somewhat improved. She denies any headache, chest pain, shortness breath, coughing, hemoptysis, dysuria, hematuria, diarrhea, hematochezia, melena.   Objective: Filed Vitals:   02/25/15 2238 02/26/15 0143 02/26/15 0500 02/26/15 0823  BP: 173/92 167/84 185/94 184/103  Pulse: 96 85 95 103  Temp: 98.9 F (37.2 C) 98.6 F (37 C) 99.2 F (37.3 C) 100.4 F (38 C)  TempSrc: Oral Oral Oral Oral  Resp: Height:      Weight:      SpO2: 100% 98% 97% 95%    Intake/Output Summary (Last 24 hours) at 02/26/15 0917 Last data filed at 02/26/15 0848  Gross per 24 hour  Intake 1676.67 ml  Output    900 ml  Net 776.67 ml   Weight change:  Exam:   General:  Pt is alert, follows commands appropriately, not in acute distress  HEENT: No icterus, No thrush,  [place]/AT  Cardiovascular: RRR, S1/S2, no rubs, no gallops  Respiratory: CTA bilaterally, no wheezing, no crackles, no rhonchi  Abdomen: Soft/+BS, non tender, non distended, no guarding; no hepatosplenomegaly  Extremities: trace LE edema,  No lymphangitis, No petechiae, No rashes, no synovitis;  right hip incision without any erythema. There is mild serous drainage. No crepitance. No necrosis.  Data Reviewed: Basic Metabolic Panel:  Recent Labs Lab 02/25/15 1503  NA 144  K 2.1*  CL 101  CO2 31  GLUCOSE 120*  BUN 11  CREATININE 0.92  CALCIUM 8.6*   Liver Function Tests:  Recent Labs Lab 02/25/15 1503  AST 18  ALT 19  ALKPHOS 74  BILITOT 1.0  PROT 6.2*  ALBUMIN  3.1*    Recent Labs Lab 02/25/15 1503  LIPASE 19*   No results for input(s): AMMONIA in the last 168 hours. CBC:  Recent Labs Lab 02/25/15 1503 02/26/15 0720  WBC 7.4 7.5  NEUTROABS 5.5  --   HGB 8.0* 7.0*  HCT 22.4* 19.7*  MCV 81.8 82.8  PLT 297 273   Cardiac Enzymes: No results for input(s): CKTOTAL, CKMB, CKMBINDEX, TROPONINI in the last 168 hours. BNP: Invalid input(s): POCBNP CBG: No results for input(s): GLUCAP in the last 168 hours.  No results found for this or any previous visit (from the past 240 hour(s)).   Scheduled Meds: . citalopram  40 mg Oral Daily  . clonazePAM  0.5 mg Oral BID  . cloNIDine  0.3 mg Oral TID  . doxazosin  8 mg Oral QHS  . losartan  100 mg Oral Daily  . metoprolol succinate  100 mg Oral Daily  . potassium chloride  40 mEq Oral Q6H  . QUEtiapine  50 mg Oral QHS  . sodium chloride  3 mL Intravenous Q12H   Continuous Infusions: . sodium chloride 100 mL/hr at 02/25/15 2047     Wade Sigala, DO  [facility] Pager [phone]  If 7PM-7AM, please contact night-coverage [email or web address] Password [email or web address] 02/26/2015, 9:17 AM   LOS: 1 day

## 2015-02-26 NOTE — Progress Notes (Signed)
Late Entry - Multiple calls made to night on-call provider for [facility].  Patient continued to have significant nausea and vomiting during the night.  Updated orders received for IV phenergan.  She continued to have significant pain in the right hip and left upper quadrant of her abdomin.  [facility] made aware and updated pain medication orders received.  She also had a surgical incision to her right hip with abdominal pad covering it.  She refused to allow this RN to change dressing due to the pain.  On admission to the unit, her temperature was 101.3.  Per ED notes, she stated she had not been running fevers.  On the floor, she stated she had [facility] running [facility].  [facility] made aware - Urine, Urine Cx, chest xray, and BC x 2 done.  B/P remained high during the night.   PRN labetalol given as ordered.  Patient unable to take any of her PO medications due to nausea and vomiting.  [facility] made aware.  She also asked for IV muscle relaxant, order received from [facility], and administered.  Patient monitored through out the night and all orders carried out.  [employer] & MinorKimberly Reno Clasby RN-BC, WTA.

## 2015-02-27 LAB — COMPREHENSIVE METABOLIC PANEL
ALBUMIN: 2.7 g/dL — AB (ref 3.5–5.0)
ALT: 17 U/L (ref 14–54)
AST: 15 U/L (ref 15–41)
Alkaline Phosphatase: 64 U/L (ref 38–126)
Anion gap: 6 (ref 5–15)
BILIRUBIN TOTAL: 1.1 mg/dL (ref 0.3–1.2)
BUN: 7 mg/dL (ref 6–20)
CALCIUM: 7.9 mg/dL — AB (ref 8.9–10.3)
CO2: 27 mmol/L (ref 22–32)
Chloride: 112 mmol/L — ABNORMAL HIGH (ref 101–111)
Creatinine, Ser: 0.96 mg/dL (ref 0.44–1.00)
GFR calc Af Amer: >60 mL/min (ref >60)
GLUCOSE: 104 mg/dL — AB (ref 65–99)
Potassium: 4.1 mmol/L (ref 3.5–5.1)
Sodium: 145 mmol/L (ref 135–145)
Total Protein: 5.3 g/dL — ABNORMAL LOW (ref 6.5–8.1)

## 2015-02-27 LAB — LACTIC ACID, PLASMA: Lactic Acid, Venous: 1 mmol/L (ref 0.5–2.0)

## 2015-02-27 LAB — CBC
HCT: 16.9 % — ABNORMAL LOW (ref 36.0–46.0)
HEMOGLOBIN: 6 g/dL — AB (ref 12.0–15.0)
MCH: 29.9 pg (ref 26.0–34.0)
MCHC: 35.5 g/dL (ref 30.0–36.0)
MCV: 84.1 fL (ref 78.0–100.0)
Platelets: 247 10*3/uL (ref 150–400)
RBC: 2.01 MIL/uL — AB (ref 3.87–5.11)
RDW: 15.3 % (ref 11.5–15.5)
WBC: 6.9 10*3/uL (ref 4.0–10.5)

## 2015-02-27 LAB — MAGNESIUM: Magnesium: 1.3 mg/dL — ABNORMAL LOW (ref 1.7–2.4)

## 2015-02-27 LAB — T4, FREE: Free T4: 1.16 ng/dL — ABNORMAL HIGH (ref 0.61–1.12)

## 2015-02-27 LAB — URINE CULTURE

## 2015-02-27 LAB — PREPARE RBC (CROSSMATCH)

## 2015-02-27 MED ORDER — MAGNESIUM SULFATE 2 GM/50ML IV SOLN
2.0000 g | Freq: Once | INTRAVENOUS | Status: AC
  Administered 2015-02-27: 2 g via INTRAVENOUS
  Filled 2015-02-27: qty 50

## 2015-02-27 MED ORDER — PANTOPRAZOLE SODIUM 40 MG IV SOLR
40.0000 mg | INTRAVENOUS | Status: DC
  Administered 2015-02-27 – 2015-02-28 (×2): 40 mg via INTRAVENOUS
  Filled 2015-02-27 (×2): qty 40

## 2015-02-27 MED ORDER — BISACODYL 10 MG RE SUPP
10.0000 mg | Freq: Once | RECTAL | Status: DC
  Filled 2015-02-27: qty 1

## 2015-02-27 MED ORDER — SODIUM CHLORIDE 0.9 % IV SOLN
Freq: Once | INTRAVENOUS | Status: AC
  Administered 2015-02-27: 16:00:00 via INTRAVENOUS

## 2015-02-27 NOTE — Progress Notes (Signed)
PT Cancellation Note  Patient Details Name: Bailey DarkMarguerite B [name] MRN: [id number] DOB: [birth year]   Cancelled Treatment:    Reason Eval/Treat Not Completed: Medical issues which prohibited therapy (receiving 2 units of blood for HGB 6.0.  return 5/31 for eval.)   Rada HayHill, Duwane Gewirtz Elizabeth 02/27/2015, 3:45 PM Blanchard KelchKaren Karey Suthers PT [phone]

## 2015-02-27 NOTE — Progress Notes (Signed)
CRITICAL VALUE ALERT  Critical value received:  Hemoglobin 6.0  Date of notification:  02/27/2015  Time of notification:  7:08 AM  Critical value read back:Yes.    Nurse who received alert:  Leala Bryand Hill Engineer, manufacturing systems(Charge Nurse)  MD notified (1st page):  D. Tat MD  Time of first page:  7:17 AM

## 2015-02-27 NOTE — Progress Notes (Signed)
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 Turner Daniels) presented with 3 days of intractable abdominal pain, nausea, vomiting. The patient was recently discharged to [facility] on 02/17/2015 after her right total hip arthroplasty. Besides her opiate medications and warfarin for DVT prophylaxis, the patient has not been started on any new medications. She stated that she had a fever up to 102.30F on the day prior to admission at [facility]. She denied any headaches, neck pain, coughing, hematemesis, hematochezia, melena, dysuria, hematuria. She denies any recent sick contacts or unusual foods. Upon admission, she was noted to have potassium 2.1. The patient was started on IV fluids and antiemetics.  Assessment/Plan: Intractable nausea and vomiting -Suspect viral gastritis in setting of continued low grade fever -appears to be more "spit up" than actual vomiting or heaving -Continue symptomatic treatment with PPI, antiemetics, IV fluids -02/25/2015 CT abdomen and pelvis negative for any acute findings; only showed moderate stool in the sigmoid colon -bisacodyl suppository Fever -Urinalysis negative for pyuria -Chest x-Roel negative for infiltrates -Blood cultures 2 sets obtained on the evening of 02/25/2015---neg at 24 hr -Hold antibiotics for now as the patient is hemodynamically stable -Right hip without erythema or purulent drainage -5/30-lactic acid -CBC with diff in am  Normocytic anemia -drop due to dilution -Transfuse 2 units PRBC today Hypokalemia/dehydration/Hypomagnesemia -Secondary to insensible loss from her fever as well as GI loss -Replete potassium and continue IV fluids -Added potassium to maintenance fluids -Check magnesium--1.3-->replete Hypertension -Patient  is able to tolerate oral medications today -Treat patient with IV lopressor for her blood pressure -restart clonidine -hydralazine prn SBP>180 S/p R-THA -site without erythema or purulent drainange -clear serous drainage noted -PT evaluation Depression with psychotic features -continue celexa and seroquel if able to tolerate po  Family Communication:   Pt at beside Disposition Plan:   SNF when medically stable       Procedures/Studies: Dg Chest 2 View  02/03/2015   CLINICAL DATA:  59 year old female with a history of scheduled right hip arthroplasty  EXAM: CHEST - 2 VIEW  COMPARISON:  None.  FINDINGS: Cardiomediastinal silhouette projects within normal limits in size and contour. No confluent airspace disease, pneumothorax, or pleural effusion.  No displaced fracture.  Unremarkable appearance of the upper abdomen.  IMPRESSION: No radiographic evidence of acute cardiopulmonary disease.  Signed,  Yvone Neu. Loreta Ave, DO  [facility]  [facility]   Electronically Signed   By: Gilmer Mor D.O.   On: 02/03/2015 15:41   Ct Abdomen Pelvis W Contrast  02/25/2015   CLINICAL DATA:  Diffuse abdominal pain, right hip surgery 02/15/2015  EXAM: CT ABDOMEN AND PELVIS WITH CONTRAST  TECHNIQUE: Multidetector CT imaging of the abdomen and pelvis was performed using the standard protocol following bolus administration of intravenous contrast.  CONTRAST:  OMNIPAQUE IOHEXOL 300 MG/ML  SOLN  COMPARISON:  12/02/2008  FINDINGS: Sagittal images of the spine shows mild degenerative changes thoracolumbar spine. There is mild about 3 mm anterolisthesis L4 on L5 vertebral body. Facet degenerative changes noted L3-L4 and L5 level.  The lung bases are unremarkable. Enhanced liver is unremarkable. Enhanced pancreas, spleen and adrenal glands are unremarkable. There is mild bilateral lobulated renal contour. Multifocal cortical scarring noted within right kidney. No  hydronephrosis or  hydroureter. Delayed renal images shows bilateral renal symmetrical excretion.  No aortic aneurysm.  No small bowel obstruction. No ascites or free air. No adenopathy. There is a small left paramedian supraumbilical hernia containing omental fat and small amount of fluid measures about 2 by 1.6 cm. There is no evidence of acute complication.  No pericecal inflammation. The terminal ileum is unremarkable. Normal appendix is partially visualized in axial image 54  Moderate stool noted in proximal sigmoid colon. Evaluation of the pelvis is markedly limited by extensive metallic artifacts from bilateral hip prosthesis.  The patient is status post hysterectomy. There is a subcutaneous fluid collection just lateral to right hip joint measures about 4.4 by 3.4 cm. This most likely is postsurgical in nature. No definite evidence of abscess. The urinary bladder is unremarkable. Mild degenerative changes pubic symphysis.  IMPRESSION: 1. There is no evidence of acute inflammatory process within abdomen or pelvis. 2. There is a small left paramedian supraumbilical hernia containing omental fat and small amount of fluid without evidence of acute complication. 3. Normal appendix.  No pericecal inflammation. 4. Bilateral lobulated renal contour. Probable cortical scarring within right kidney. Bilateral renal symmetrical excretion. No hydronephrosis or hydroureter. 5. Small amount of subcutaneous fluid is noted just lateral to right hip joint probable postsurgical in nature. No definite evidence of abscess. 6. Degenerative changes lumbar spine as described above.   Electronically Signed   By: Natasha Mead M.D.   On: 02/25/2015 17:48   Dg Pelvis Portable  02/15/2015   CLINICAL DATA:  Status post right total hip arthroplasty.  EXAM: PORTABLE PELVIS 1-2 VIEWS  COMPARISON:  December 23, 2011.  FINDINGS: Patient is status post interval placement of right total hip arthroplasty. The femoral and acetabular components  appear to be well situated. Left total hip arthroplasty noted on prior for exam is stable. No fracture or dislocation is noted.  IMPRESSION: Status post interval placement of right total hip arthroplasty.   Electronically Signed   By: Lupita Raider, M.D.   On: 02/15/2015 16:17   Dg Chest Port 1 View  02/25/2015   CLINICAL DATA:  Postoperative fever  EXAM: PORTABLE CHEST - 1 VIEW  COMPARISON:  08/18/2012  FINDINGS: Cardiac shadow is stable. No focal infiltrate is seen. Some scarring is again noted in the left lung base no acute bony abnormality is noted.  IMPRESSION: No active disease.   Electronically Signed   By: Alcide Clever M.D.   On: 02/25/2015 21:48         Subjective: Patient complains of low back pain. Denies any chest pain, shortness breath, abdominal pain, dysuria, hematuria. Complains of constipation. Denies any neck pain or visual disturbance. Denies any coughing or hemoptysis. She has been spitting up clear fluid. Denies any new rashes.  Objective: Filed Vitals:   02/27/15 0015 02/27/15 0447 02/27/15 0917 02/27/15 1212  BP: 160/85 158/92 161/94 174/88  Pulse: 108 99 110 113  Temp: 99.3 F (37.4 C) 100.2 F (37.9 C) 98.3 F (36.8 C) 98.9 F (37.2 C)  TempSrc: Oral Oral Oral Oral  Resp: 18 18 17 16   Height:      Weight:      SpO2: 97% 96% 99% 98%    Intake/Output Summary (Last 24 hours) at 02/27/15 1234 Last data filed at 02/27/15 0903  Gross per 24 hour  Intake   2120 ml  Output    250 ml  Net   1870 ml   Weight change: 3.221 kg (7 lb  1.6 oz) Exam:   General:  Pt is alert, follows commands appropriately, not in acute distress  HEENT: No icterus, No thrush, No neck mass, [place]/AT  Cardiovascular: RRR, S1/S2, no rubs, no gallops  Respiratory: CTA bilaterally, no wheezing, no crackles, no rhonchi  Abdomen: Soft/+BS, non tender, non distended, no guarding  Extremities: No edema, No lymphangitis, No petechiae, No rashes, no synovitis; right hip without any  erythema, necrosis. There is serous clear drainage.  Data Reviewed: Basic Metabolic Panel:  Recent Labs Lab 02/25/15 1503 02/26/15 0720 02/27/15 0607  NA 144 144 145  K 2.1* 2.6* 4.1  CL 101 106 112*  CO2 31 29 27   GLUCOSE 120* 108* 104*  BUN 11 8 7   CREATININE 0.92 0.89 0.96  CALCIUM 8.6* 7.9* 7.9*  MG  --   --  1.3*   Liver Function Tests:  Recent Labs Lab 02/25/15 1503 02/26/15 0720 02/27/15 0607  AST 18 17 15   ALT 19 17 17   ALKPHOS 74 66 64  BILITOT 1.0 1.2 1.1  PROT 6.2* 5.4* 5.3*  ALBUMIN 3.1* 2.8* 2.7*    Recent Labs Lab 02/25/15 1503  LIPASE 19*   No results for input(s): AMMONIA in the last 168 hours. CBC:  Recent Labs Lab 02/25/15 1503 02/26/15 0720 02/27/15 0607  WBC 7.4 7.5 6.9  NEUTROABS 5.5  --   --   HGB 8.0* 7.0* 6.0*  HCT 22.4* 19.7* 16.9*  MCV 81.8 82.8 84.1  PLT 297 273 247   Cardiac Enzymes: No results for input(s): CKTOTAL, CKMB, CKMBINDEX, TROPONINI in the last 168 hours. BNP: Invalid input(s): POCBNP CBG: No results for input(s): GLUCAP in the last 168 hours.  Recent Results (from the past 240 hour(s))  Culture, Urine     Status: None   Collection Time: 02/25/15  9:31 PM  Result Value Ref Range Status   Specimen Description URINE, RANDOM  Final   Special Requests NONE  Final   Colony Count   Final    >=100,000 COLONIES/ML Performed at [facility]    Culture   Final    Multiple bacterial morphotypes present, none predominant. Suggest appropriate recollection if clinically indicated. Performed at [facility]    Report Status 02/27/2015 FINAL  Final  Culture, blood (routine x 2)     Status: None (Preliminary result)   Collection Time: 02/25/15  9:55 PM  Result Value Ref Range Status   Specimen Description BLOOD LEFT ARM  Final   Special Requests BOTTLES DRAWN AEROBIC AND ANAEROBIC 10CC  Final   Culture   Final           BLOOD CULTURE RECEIVED NO GROWTH TO DATE CULTURE WILL BE HELD FOR 5 DAYS  BEFORE ISSUING A FINAL NEGATIVE REPORT Note: Culture results may be compromised due to an excessive volume of blood received in culture bottles. Performed at [facility]    Report Status PENDING  Incomplete  Culture, blood (routine x 2)     Status: None (Preliminary result)   Collection Time: 02/25/15 10:00 PM  Result Value Ref Range Status   Specimen Description BLOOD RIGHT HAND  Final   Special Requests BOTTLES DRAWN AEROBIC ONLY 4CC  Final   Culture   Final           BLOOD CULTURE RECEIVED NO GROWTH TO DATE CULTURE WILL BE HELD FOR 5 DAYS BEFORE ISSUING A FINAL NEGATIVE REPORT Performed at [facility]    Report Status PENDING  Incomplete  Scheduled Meds: . sodium chloride   Intravenous Once  . citalopram  40 mg Oral Daily  . clonazePAM  0.5 mg Oral BID  . magnesium sulfate 1 - 4 g bolus IVPB  2 g Intravenous Once  . metoprolol  5 mg Intravenous 4 times per day  . QUEtiapine  50 mg Oral QHS  . sodium chloride  3 mL Intravenous Q12H   Continuous Infusions: . sodium chloride 0.9 % 1,000 mL with potassium chloride 40 mEq infusion 100 mL/hr at 02/27/15 1038     Lenn Volker, DO  [facility] Pager [phone]  If 7PM-7AM, please contact night-coverage [email or web address] Password [email or web address] 02/27/2015, 12:34 PM   LOS: 2 days

## 2015-02-28 DIAGNOSIS — L8990 Pressure ulcer of unspecified site, unspecified stage: Secondary | ICD-10-CM | POA: Insufficient documentation

## 2015-02-28 LAB — BASIC METABOLIC PANEL
Anion gap: 8 (ref 5–15)
BUN: <5 mg/dL — ABNORMAL LOW (ref 6–20)
CO2: 26 mmol/L (ref 22–32)
Calcium: 8.1 mg/dL — ABNORMAL LOW (ref 8.9–10.3)
Chloride: 105 mmol/L (ref 101–111)
Creatinine, Ser: 0.93 mg/dL (ref 0.44–1.00)
GFR calc Af Amer: >60 mL/min (ref >60)
GFR calc non Af Amer: >60 mL/min (ref >60)
GLUCOSE: 93 mg/dL (ref 65–99)
POTASSIUM: 3.4 mmol/L — AB (ref 3.5–5.1)
SODIUM: 139 mmol/L (ref 135–145)

## 2015-02-28 LAB — TYPE AND SCREEN
ABO/RH(D): A POS
Antibody Screen: NEGATIVE
UNIT DIVISION: 0
Unit division: 0

## 2015-02-28 LAB — CBC WITH DIFFERENTIAL/PLATELET
BASOS PCT: 1 % (ref 0–1)
Basophils Absolute: 0.1 10*3/uL (ref 0.0–0.1)
EOS ABS: 0.4 10*3/uL (ref 0.0–0.7)
EOS PCT: 3 % (ref 0–5)
HCT: 26.8 % — ABNORMAL LOW (ref 36.0–46.0)
Hemoglobin: 9.5 g/dL — ABNORMAL LOW (ref 12.0–15.0)
LYMPHS ABS: 1.7 10*3/uL (ref 0.7–4.0)
Lymphocytes Relative: 15 % (ref 12–46)
MCH: 29.3 pg (ref 26.0–34.0)
MCHC: 35.4 g/dL (ref 30.0–36.0)
MCV: 82.7 fL (ref 78.0–100.0)
Monocytes Absolute: 0.8 10*3/uL (ref 0.1–1.0)
Monocytes Relative: 7 % (ref 3–12)
NEUTROS PCT: 74 % (ref 43–77)
Neutro Abs: 8.3 10*3/uL — ABNORMAL HIGH (ref 1.7–7.7)
PLATELETS: 270 10*3/uL (ref 150–400)
RBC: 3.24 MIL/uL — AB (ref 3.87–5.11)
RDW: 14.7 % (ref 11.5–15.5)
WBC: 11.3 10*3/uL — ABNORMAL HIGH (ref 4.0–10.5)

## 2015-02-28 LAB — MAGNESIUM: Magnesium: 1.6 mg/dL — ABNORMAL LOW (ref 1.7–2.4)

## 2015-02-28 MED ORDER — MAGNESIUM SULFATE 2 GM/50ML IV SOLN
2.0000 g | Freq: Once | INTRAVENOUS | Status: AC
  Administered 2015-02-28: 2 g via INTRAVENOUS
  Filled 2015-02-28: qty 50

## 2015-02-28 MED ORDER — OXYCODONE HCL 5 MG PO TABS: 5.0000 mg | ORAL_TABLET | ORAL | Status: DC | PRN

## 2015-02-28 MED ORDER — POTASSIUM CHLORIDE 10 MEQ/100ML IV SOLN
10.0000 meq | INTRAVENOUS | Status: AC
  Administered 2015-02-28 (×2): 10 meq via INTRAVENOUS
  Filled 2015-02-28 (×2): qty 100

## 2015-02-28 MED ORDER — TRAMADOL HCL 50 MG PO TABS: 50.0000 mg | ORAL_TABLET | ORAL | Status: DC | PRN

## 2015-02-28 NOTE — Clinical Social Work Note (Signed)
Patient medically stable for [facility] today. Facility admissions staff notified and can take patient back. Discharge information transmitted to facility. Ms. Rosalia HammersRay will be transported to skilled facility by ambulance. Family was in the room during patient's visit and is aware of discharge.  Genelle BalVanessa Emylee Decelle, MSW, LCSW Licensed Clinical Social Worker Clinical Social Work Department [facility] [phone]

## 2015-02-28 NOTE — Discharge Summary (Signed)
Physician Discharge Summary  Bailey [name]:[id number] DOB: Mar 01, 1956 DOA: 02/25/2015  PCP: Georgann Housekeeper, MD  Admit date: 02/25/2015 Discharge date: 02/28/2015  Recommendations for Outpatient Follow-up:  1. Pt will need to follow up with PCP in 2 weeks post discharge 2. Please obtain BMP and CBC in one week   Discharge Diagnoses:  Intractable nausea and vomiting -Suspect viral gastritis in setting of continued low grade fever -appears to be more "spit up" than actual vomiting or heaving -Continue symptomatic treatment with PPI, antiemetics, IV fluids -02/25/2015 CT abdomen and pelvis negative for any acute findings; only showed moderate stool in the sigmoid colon -bisacodyl suppository -After the patient was transfused with PRBCs and had a bowel movement, the patient improved clinically -Her diet was advanced to a soft diet which she tolerated Fever -Urinalysis negative for pyuria -Chest x-[place] negative for infiltrates -Blood cultures 2 sets obtained on the evening of 02/25/2015---neg at time of discharge -Hold antibiotics for now as the patient is hemodynamically stable -Right hip without erythema or purulent drainage -5/30-lactic acid--1.0 Normocytic anemia -drop due to dilution -Transfuse 2 units PRBC today-hemoglobin 9.5 on the day of discharge- Hypokalemia/dehydration/Hypomagnesemia -Secondary to insensible loss from her fever as well as GI loss -Replete potassium and continue IV fluids -Added potassium to maintenance fluids -Check magnesium--1.3-->repleted x 2 Hypertension -Patient is able to tolerate oral medications today -Treat patient with IV lopressor for her blood pressurewhen the patient was vomiting -restart clonidine, metoprolol succinate, losartan after d/c -hydralazine prn SBP>180 S/p R-THA -site without erythema or purulent drainange -clear serous drainage noted -PT evaluation Depression with psychotic features -continue celexa and seroquel if  able to tolerate po  Discharge Condition: stable  Disposition: home  Diet:soft Wt Readings from Last 3 Encounters:  02/27/15 88.905 kg (196 lb)  02/20/15 93.895 kg (207 lb)  02/15/15 94.121 kg (207 lb 8 oz)    History of present illness:  59 year old female with a history of hypertension, anxiety, and 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 Turner Daniels) presented with 3 days of intractable abdominal pain, nausea, vomiting. The patient was recently discharged to [facility] on 02/17/2015 after her right total hip arthroplasty. Besides her opiate medications and warfarin for DVT prophylaxis, the patient has not been started on any new medications. She stated that she had a fever up to 102.9F on the day prior to admission at [facility]. She denied any headaches, neck pain, coughing, hematemesis, hematochezia, melena, dysuria, hematuria. She denies any recent sick contacts or unusual foods. Upon admission, she was noted to have potassium 2.1. The patient was started on IV fluids and antiemetics. The patient was treated conservatively. Over a period of 48-72 hours, she gradually improved. The patient's diet was advanced which she tolerated. The patient's fever gradually subsided. Her cultures were negative. She was not started on antibiotics. The patient will be discharged back to [facility] for rehabilitation for her hip.    Discharge Exam: Filed Vitals:   02/28/15 1000  BP: 90/51  Pulse: 83  Temp: 98 F (36.7 C)  Resp: 19   Filed Vitals:   02/27/15 2303 02/28/15 0216 02/28/15 0543 02/28/15 1000  BP: 156/85 178/88 161/95 90/51  Pulse: 113 100 96 83  Temp: 99.8 F (37.7 C) 98.6 F (37 C) 98.7 F (37.1 C) 98 F (36.7 C)  TempSrc: Oral Oral  Oral  Resp: 17 18 19 19   Height:      Weight:      SpO2: 99% 95% 95%  98%   General: A&O x 3, NAD, pleasant, cooperative Cardiovascular: RRR, no rub, no gallop, no S3 Respiratory: CTAB, no wheeze, no  rhonchi Abdomen:soft, nontender, nondistended, positive bowel sounds Extremities: 1+LE edema, No lymphangitis, no petechiae  Discharge Instructions      Discharge Instructions    Diet - low sodium heart healthy    Complete by:  As directed      Increase activity slowly    Complete by:  As directed             Medication List    STOP taking these medications        COUMADIN PO     oxyCODONE-acetaminophen 5-325 MG per tablet  Commonly known as:  ROXICET     tizanidine 2 MG capsule  Commonly known as:  ZANAFLEX     VITAMIN K PO     warfarin 5 MG tablet  Commonly known as:  COUMADIN      TAKE these medications        acetaminophen 325 MG tablet  Commonly known as:  TYLENOL  Take 1,300 mg by mouth 2 (two) times daily as needed (pain).     aspirin EC 325 MG tablet  Take 1 tablet (325 mg total) by mouth 2 (two) times daily.     citalopram 40 MG tablet  Commonly known as:  CELEXA  Take 40 mg by mouth daily.     clonazePAM 0.5 MG tablet  Commonly known as:  KLONOPIN  Take 0.5 mg by mouth 2 (two) times daily.     cloNIDine 0.3 MG tablet  Commonly known as:  CATAPRES  Take 0.3 mg by mouth 2 (two) times daily.     doxazosin 8 MG tablet  Commonly known as:  CARDURA  Take 8 mg by mouth at bedtime.     FLAX SEED OIL PO  Take 1,200 mg by mouth daily.     losartan 100 MG tablet  Commonly known as:  COZAAR  Take 100 mg by mouth daily.     metoprolol succinate 100 MG 24 hr tablet  Commonly known as:  TOPROL-XL  Take 100 mg by mouth daily.     omeprazole 40 MG capsule  Commonly known as:  PRILOSEC  Take 40 mg by mouth daily before breakfast.     oxyCODONE 5 MG immediate release tablet  Commonly known as:  Oxy IR/ROXICODONE  Take 1 tablet (5 mg total) by mouth every 4 (four) hours as needed for moderate pain.     potassium chloride SA 20 MEQ tablet  Commonly known as:  K-DUR,KLOR-CON  Take 20 mEq by mouth 2 (two) times daily.     promethazine 25 MG tablet   Commonly known as:  PHENERGAN  Take 25 mg by mouth every 6 (six) hours as needed for nausea or vomiting.     QUEtiapine 50 MG tablet  Commonly known as:  SEROQUEL  Take 1 tablet (50 mg total) by mouth at bedtime.     traMADol 50 MG tablet  Commonly known as:  ULTRAM  Take 1 tablet (50 mg total) by mouth every 4 (four) hours as needed for moderate pain.     triamterene-hydrochlorothiazide 37.5-25 MG per tablet  Commonly known as:  MAXZIDE-25  Take 2 tablets by mouth every evening. Daily at 1700     VISINE OP  Place 1 drop into both eyes daily as needed (dry eyes).     vitamin C 500 MG tablet  Commonly known  as:  ASCORBIC ACID  Take 500 mg by mouth daily.     Vitamin D 2000 UNITS tablet  Take 2,000 Units by mouth daily.         The results of significant diagnostics from this hospitalization (including imaging, microbiology, ancillary and laboratory) are listed below for reference.    Significant Diagnostic Studies: Dg Chest 2 View  02/03/2015   CLINICAL DATA:  59 year old female with a history of scheduled right hip arthroplasty  EXAM: CHEST - 2 VIEW  COMPARISON:  None.  FINDINGS: Cardiomediastinal silhouette projects within normal limits in size and contour. No confluent airspace disease, pneumothorax, or pleural effusion.  No displaced fracture.  Unremarkable appearance of the upper abdomen.  IMPRESSION: No radiographic evidence of acute cardiopulmonary disease.  Signed,  Yvone Neu. Loreta Ave, DO  [facility]  [facility]   Electronically Signed   By: Gilmer Mor D.O.   On: 02/03/2015 15:41   Ct Abdomen Pelvis W Contrast  02/25/2015   CLINICAL DATA:  Diffuse abdominal pain, right hip surgery 02/15/2015  EXAM: CT ABDOMEN AND PELVIS WITH CONTRAST  TECHNIQUE: Multidetector CT imaging of the abdomen and pelvis was performed using the standard protocol following bolus administration of intravenous contrast.  CONTRAST:  OMNIPAQUE  IOHEXOL 300 MG/ML  SOLN  COMPARISON:  12/02/2008  FINDINGS: Sagittal images of the spine shows mild degenerative changes thoracolumbar spine. There is mild about 3 mm anterolisthesis L4 on L5 vertebral body. Facet degenerative changes noted L3-L4 and L5 level.  The lung bases are unremarkable. Enhanced liver is unremarkable. Enhanced pancreas, spleen and adrenal glands are unremarkable. There is mild bilateral lobulated renal contour. Multifocal cortical scarring noted within right kidney. No hydronephrosis or hydroureter. Delayed renal images shows bilateral renal symmetrical excretion.  No aortic aneurysm.  No small bowel obstruction. No ascites or free air. No adenopathy. There is a small left paramedian supraumbilical hernia containing omental fat and small amount of fluid measures about 2 by 1.6 cm. There is no evidence of acute complication.  No pericecal inflammation. The terminal ileum is unremarkable. Normal appendix is partially visualized in axial image 54  Moderate stool noted in proximal sigmoid colon. Evaluation of the pelvis is markedly limited by extensive metallic artifacts from bilateral hip prosthesis.  The patient is status post hysterectomy. There is a subcutaneous fluid collection just lateral to right hip joint measures about 4.4 by 3.4 cm. This most likely is postsurgical in nature. No definite evidence of abscess. The urinary bladder is unremarkable. Mild degenerative changes pubic symphysis.  IMPRESSION: 1. There is no evidence of acute inflammatory process within abdomen or pelvis. 2. There is a small left paramedian supraumbilical hernia containing omental fat and small amount of fluid without evidence of acute complication. 3. Normal appendix.  No pericecal inflammation. 4. Bilateral lobulated renal contour. Probable cortical scarring within right kidney. Bilateral renal symmetrical excretion. No hydronephrosis or hydroureter. 5. Small amount of subcutaneous fluid is noted just lateral  to right hip joint probable postsurgical in nature. No definite evidence of abscess. 6. Degenerative changes lumbar spine as described above.   Electronically Signed   By: Natasha Mead M.D.   On: 02/25/2015 17:48   Dg Pelvis Portable  02/15/2015   CLINICAL DATA:  Status post right total hip arthroplasty.  EXAM: PORTABLE PELVIS 1-2 VIEWS  COMPARISON:  December 23, 2011.  FINDINGS: Patient is status post interval placement of right total hip arthroplasty. The femoral and acetabular components appear to  be well situated. Left total hip arthroplasty noted on prior for exam is stable. No fracture or dislocation is noted.  IMPRESSION: Status post interval placement of right total hip arthroplasty.   Electronically Signed   By: Lupita RaiderJames  Green Jr, M.D.   On: 02/15/2015 16:17   Dg Chest Port 1 View  02/25/2015   CLINICAL DATA:  Postoperative fever  EXAM: PORTABLE CHEST - 1 VIEW  COMPARISON:  08/18/2012  FINDINGS: Cardiac shadow is stable. No focal infiltrate is seen. Some scarring is again noted in the left lung base no acute bony abnormality is noted.  IMPRESSION: No active disease.   Electronically Signed   By: Alcide CleverMark  Lukens M.D.   On: 02/25/2015 21:48     Microbiology: Recent Results (from the past 240 hour(s))  Culture, Urine     Status: None   Collection Time: 02/25/15  9:31 PM  Result Value Ref Range Status   Specimen Description URINE, RANDOM  Final   Special Requests NONE  Final   Colony Count   Final    >=100,000 COLONIES/ML Performed at [facility]    Culture   Final    Multiple bacterial morphotypes present, none predominant. Suggest appropriate recollection if clinically indicated. Performed at [facility]    Report Status 02/27/2015 FINAL  Final  Culture, blood (routine x 2)     Status: None (Preliminary result)   Collection Time: 02/25/15  9:55 PM  Result Value Ref Range Status   Specimen Description BLOOD LEFT ARM  Final   Special Requests BOTTLES DRAWN AEROBIC AND  ANAEROBIC 10CC  Final   Culture   Final           BLOOD CULTURE RECEIVED NO GROWTH TO DATE CULTURE WILL BE HELD FOR 5 DAYS BEFORE ISSUING A FINAL NEGATIVE REPORT Note: Culture results may be compromised due to an excessive volume of blood received in culture bottles. Performed at [facility]    Report Status PENDING  Incomplete  Culture, blood (routine x 2)     Status: None (Preliminary result)   Collection Time: 02/25/15 10:00 PM  Result Value Ref Range Status   Specimen Description BLOOD RIGHT HAND  Final   Special Requests BOTTLES DRAWN AEROBIC ONLY 4CC  Final   Culture   Final           BLOOD CULTURE RECEIVED NO GROWTH TO DATE CULTURE WILL BE HELD FOR 5 DAYS BEFORE ISSUING A FINAL NEGATIVE REPORT Performed at [facility]    Report Status PENDING  Incomplete     Labs: Basic Metabolic Panel:  Recent Labs Lab 02/25/15 1503 02/26/15 0720 02/27/15 0607 02/28/15 0540  NA 144 144 145 139  K 2.1* 2.6* 4.1 3.4*  CL 101 106 112* 105  CO2 31 29 27 26   GLUCOSE 120* 108* 104* 93  BUN 11 8 7  <5*  CREATININE 0.92 0.89 0.96 0.93  CALCIUM 8.6* 7.9* 7.9* 8.1*  MG  --   --  1.3* 1.6*   Liver Function Tests:  Recent Labs Lab 02/25/15 1503 02/26/15 0720 02/27/15 0607  AST 18 17 15   ALT 19 17 17   ALKPHOS 74 66 64  BILITOT 1.0 1.2 1.1  PROT 6.2* 5.4* 5.3*  ALBUMIN 3.1* 2.8* 2.7*    Recent Labs Lab 02/25/15 1503  LIPASE 19*   No results for input(s): AMMONIA in the last 168 hours. CBC:  Recent Labs Lab 02/25/15 1503 02/26/15 0720 02/27/15 0607 02/28/15 0540  WBC 7.4  7.5 6.9 11.3*  NEUTROABS 5.5  --   --  8.3*  HGB 8.0* 7.0* 6.0* 9.5*  HCT 22.4* 19.7* 16.9* 26.8*  MCV 81.8 82.8 84.1 82.7  PLT 297 273 247 270   Cardiac Enzymes: No results for input(s): CKTOTAL, CKMB, CKMBINDEX, TROPONINI in the last 168 hours. BNP: Invalid input(s): POCBNP CBG: No results for input(s): GLUCAP in the last 168 hours.  Time coordinating discharge:  Greater  than 30 minutes  Signed:  Therisa Mennella, DO [facility] Pager: [phone] 02/28/2015, 12:35 PM

## 2015-02-28 NOTE — Clinical Social Work Note (Signed)
Clinical Social Work Assessment  Patient Details  Name: Bailey [name] MRN: [id number] Date of Birth: 06/04/1956  Date of referral:  02/27/15               Reason for consult:  Facility Placement                Permission sought to share information with:  Facility Industrial/product designerContact Representative Permission granted to share information::  Yes, Verbal Permission Granted to talk with skilled facility admissions staff  Name::        Agency::     Relationship::     Contact Information:     Housing/Transportation Living arrangements for the past 2 months:  Apartment Source of Information:  Patient Patient Interpreter Needed:  None Criminal Activity/Legal Involvement Pertinent to Current Situation/Hospitalization:    Significant Relationships:  Parents, Friend, Siblings Lives with:    Do you feel safe going back to the place where you live?  Yes Need for family participation in patient care:  No (Coment)  Care giving concerns:  None mentioned   Office managerocial Worker assessment / plan:  CSW talked with patient about discharge plan and intent [facility]. Patient indicated with a smile that she does not want to return, but will [facility] her [facility].  Employment status:  Disabled (Comment on whether or not currently receiving Disability) Insurance information:  Managed Medicare PT Recommendations:  Skilled Nursing Facility Information / Referral to community resources:  Other (Comment Required) (None needed or requested at this time.)  Patient/Family's Response to care:  Patient did not mention any needs or concerns regarding her care.  Patient/Family's Understanding of and Emotional Response to Diagnosis, Current Treatment, and Prognosis:  Patient aware that she is nearing readiness for discharge.   Emotional Assessment Appearance:  Appears stated age Attitude/Demeanor/Rapport:  Other (Appropriate for interaction) Affect (typically  observed):  Appropriate Orientation:    Alcohol / Substance use:  Tobacco Use, Alcohol Use (Patient reports that she does not smoke but does drink alcohol.) Psych involvement (Current and /or in the community):  No (Comment)  Discharge Needs  Concerns to be addressed:  Discharge Planning Concerns (Patient from skilled facility and will return at discharge) Readmission within the last 30 days:  Yes Current discharge risk:  None Barriers to Discharge:  No Barriers Identified   Cristobal GoldmannCrawford, Anushree Dorsi Bradley, LCSW 02/28/2015, 6:51 PM

## 2015-02-28 NOTE — Care Management Note (Signed)
Case Management Note  Patient Details  Name: Bailey [name] MRN: [id number] Date of Birth: May 24, 1956  Subjective/Objective:             CM following for progression and d/c planning.       Action/Plan: Met with pt and plan is to return to SNF for rehab, no other d/c needs.   Expected Discharge Date:  03/01/15               Expected Discharge Plan:  Skilled Nursing Facility  In-House Referral:  Clinical Social Work  Discharge planning Services  NA  Post Acute Care Choice:  NA Choice offered to:  NA  DME Arranged:    DME Agency:     HH Arranged:    [place] Agency:     Status of Service:  Completed, signed off  Medicare Important Message Given:  Yes Date Medicare IM Given:  02/28/15 Medicare IM give by:  Jasmine Pang RN MPH, case manager, [phone] Date Additional Medicare IM Given:    Additional Medicare Important Message give by:     If discussed at [place] of Stay Meetings, dates discussed:    Additional Comments:  Adron Bene, RN 02/28/2015, 12:42 PM

## 2015-02-28 NOTE — Evaluation (Signed)
Physical Therapy Evaluation Patient Details Name: Bailey [name] MRN: [id number] DOB: 08-17-1956 Today's Date: 02/28/2015   History of Present Illness  59 y.o. female s/p right total hip arthroplasty, [facility]; admitted with N/V, hypokalemia, anemia; recieved blood and is feeling better on PT eval  Clinical Impression  Pt admitted with above diagnosis. Pt currently with functional limitations due to the deficits listed below (see PT Problem List).  Pt will benefit from skilled PT to increase their independence and safety with mobility to allow discharge to the venue listed below.       Follow Up Recommendations SNF    Equipment Recommendations  None recommended by PT    Recommendations for Other Services       Precautions / Restrictions Precautions Precautions: Posterior Hip Precaution Booklet Issued: Yes (comment) Precaution Comments: Reviewed post Hip Prec, pt recalled 3/3 Restrictions RLE Weight Bearing: Weight bearing as tolerated      Mobility  Bed Mobility                  Transfers Overall transfer level: Needs assistance Equipment used: Rolling walker (2 wheeled) Transfers: Sit to/from Stand Sit to Stand: Min guard         General transfer comment: Cues for hand placement and pre-positioning hip for precautions  Ambulation/Gait Ambulation/Gait assistance: Min guard Ambulation Distance (Feet): 60 Feet Assistive device: Rolling walker (2 wheeled) Gait Pattern/deviations: Step-through pattern Gait velocity: slow   General Gait Details: cues for upright posture, and for post hip prec, especially with turns; cues also to self-monitor for activity tolerance  Stairs            Wheelchair Mobility    Modified Rankin (Stroke Patients Only)       Balance             Standing balance-Leahy Scale: Fair                               Pertinent Vitals/Pain Pain Assessment: 0-10 Pain Score: 4  Pain Location: R  hip Pain Descriptors / Indicators: Aching Pain Intervention(s): Limited activity within patient's tolerance;Monitored during session;Premedicated before session    Home Living Family/patient expects to be discharged to:: Skilled nursing facility Living Arrangements: Alone Available Help at Discharge: Skilled Nursing Facility                  Prior Function Level of Independence: Independent with assistive device(s)         Comments: used RW for ambulating     Hand Dominance   Dominant Hand: Right    Extremity/Trunk Assessment   Upper Extremity Assessment: Overall WFL for tasks assessed           Lower Extremity Assessment: RLE deficits/detail RLE Deficits / Details: decreased strength and ROM as expected post op    Cervical / Trunk Assessment: Normal  Communication   Communication: No difficulties  Cognition Arousal/Alertness: Awake/alert Behavior During Therapy: WFL for tasks assessed/performed Overall Cognitive Status: Within Functional Limits for tasks assessed                      General Comments      Exercises Total Joint Exercises Ankle Circles/Pumps:  (plan for tehrex next session)      Assessment/Plan    PT Assessment Patient needs continued PT services  PT Diagnosis Difficulty walking;Abnormality of gait;Acute pain   PT Problem List Decreased  strength;Decreased range of motion;Decreased activity tolerance;Decreased balance;Decreased mobility;Decreased cognition;Decreased knowledge of use of DME;Decreased knowledge of precautions;Pain  PT Treatment Interventions DME instruction;Gait training;Functional mobility training;Therapeutic activities;Therapeutic exercise;Balance training;Neuromuscular re-education;Cognitive remediation;Patient/family education;Modalities   PT Goals (Current goals can be found in the Care Plan section) Acute Rehab PT Goals Patient Stated Goal: to go to rehab then home PT Goal Formulation: With patient Time  For Goal Achievement: 03/02/15 Potential to Achieve Goals: Good    Frequency Min 5X/week   Barriers to discharge        Co-evaluation               End of Session Equipment Utilized During Treatment: Gait belt Activity Tolerance: Patient tolerated treatment well Patient left: in chair;with call bell/phone within reach Nurse Communication: Mobility status         Time: 1022-1052 PT Time Calculation (min) (ACUTE ONLY): 30 min   Charges:   PT Evaluation $Initial PT Evaluation Tier I: 1 Procedure PT Treatments $Gait Training: 8-22 mins   PT G CodesOlen Pel 02/28/2015, 11:22 AM  Van Clines, PT  Acute Rehabilitation Services Pager [phone] Office [phone]

## 2015-03-02 ENCOUNTER — Encounter: Payer: Self-pay | Admitting: Adult Health

## 2015-03-02 ENCOUNTER — Non-Acute Institutional Stay (SKILLED_NURSING_FACILITY): Payer: Medicare Other | Admitting: Adult Health

## 2015-03-02 DIAGNOSIS — M1611 Unilateral primary osteoarthritis, right hip: Secondary | ICD-10-CM

## 2015-03-02 DIAGNOSIS — F333 Major depressive disorder, recurrent, severe with psychotic symptoms: Secondary | ICD-10-CM | POA: Diagnosis not present

## 2015-03-02 DIAGNOSIS — R1110 Vomiting, unspecified: Secondary | ICD-10-CM | POA: Diagnosis not present

## 2015-03-02 DIAGNOSIS — D649 Anemia, unspecified: Secondary | ICD-10-CM

## 2015-03-02 DIAGNOSIS — E876 Hypokalemia: Secondary | ICD-10-CM

## 2015-03-02 DIAGNOSIS — K219 Gastro-esophageal reflux disease without esophagitis: Secondary | ICD-10-CM

## 2015-03-02 DIAGNOSIS — F411 Generalized anxiety disorder: Secondary | ICD-10-CM

## 2015-03-02 DIAGNOSIS — I10 Essential (primary) hypertension: Secondary | ICD-10-CM | POA: Diagnosis not present

## 2015-03-02 NOTE — Progress Notes (Signed)
Patient ID: Bailey [name], female   DOB: [birth year], 59 y.o.   MRN: [id number]   03/02/2015  Facility:  Nursing Home Location:  [facility] Room Number: [id number] LEVEL OF CARE:  SNF (31)   Chief Complaint  Patient presents with  . Hospitalization Follow-up    Intractable nausea and vomiting, anemia, hypokalemia, hypertension, osteoarthritis S/P right total hip arthroplasty, depression with psychotic features, anxiety and GERD    HISTORY OF PRESENT ILLNESS:  This is a 59 year old female who has been admitted to [facility] on 02/28/15 from [facility]. She has PMH of hypertension, stroke in 2005 with right-sided weakness, anxiety, asthma, goiter removed at age 75 and major depression. She was having short-term rehabilitation @ [facility] after having right total hip arthroplasty. She was sent to the hospital from [facility] due to intractable nausea and vomiting. It was thought to be viral gastritis. She was treated conservatively and had transfusion of 2 units PRBC.  She has been re-admitted for a short-term rehabilitation.  PAST MEDICAL HISTORY:  Past Medical History  Diagnosis Date  . Hypertension     all BP meds. managed by Dr. Eula Listen    . Stroke     2005, R sided weakness   . Pneumonia     while at [facility] 1990's told that she had pneumonia & was isolated   . Arthritis     OA- R knee, L hip   . Anxiety     "Breakdown" in the 1990's, hosp. for >1 wk. at [facility]    . Incontinence of urine     weak bladder  . Hypertension   . Stroke 2005    ? weakness in hand and arms per mother. patient unsure  . Asthma     years ago. "no problems in a long while."  . Anxiety   . Depression   . GERD (gastroesophageal reflux disease)   . Constipation   . Goiter     removed  at age 61    CURRENT MEDICATIONS: Reviewed per MAR/see medication list  Allergies  Allergen Reactions  . Codeine Nausea Only and Other (See Comments)    headaches   . Sulfa Antibiotics Nausea Only    Upset stomach, headaches  . Sulfa Drugs Cross Reactors Other (See Comments)    Its been a long time ago     REVIEW OF SYSTEMS:  GENERAL: no change in appetite, no fatigue, no weight changes, no fever, chills or weakness RESPIRATORY: no cough, SOB, DOE, wheezing, hemoptysis CARDIAC: no chest pain, or palpitations GI: no abdominal pain, diarrhea, heart burn, nausea or vomiting, + constipation  PHYSICAL EXAMINATION  GENERAL: no acute distress, obese SKIN:  Right hip surgical incision has dry dressing, no erythema NECK: supple, trachea midline, no neck masses, no thyroid tenderness, no thyromegaly LYMPHATICS: no LAN in the neck, no supraclavicular LAN RESPIRATORY: breathing is even & unlabored, BS CTAB CARDIAC: RRR, no murmur,no extra heart sounds, BLE edema 1+ edema GI: abdomen soft, normal BS, no masses, no tenderness, no hepatomegaly, no splenomegaly EXTREMITIES:  Able to move X 4 extremities PSYCHIATRIC: the patient is alert & oriented to person, affect & behavior appropriate  LABS/RADIOLOGY: Labs reviewed: Basic Metabolic Panel:  Recent Labs  40/98/11 0720 02/27/15 0607 02/28/15 0540  NA 144 145 139  K 2.6* 4.1 3.4*  CL 106 112* 105  CO2 GLUCOSE 108* 104* 93  BUN 8 7 <5*  CREATININE  0.89 0.96 0.93  CALCIUM 7.9* 7.9* 8.1*  MG  --  1.3* 1.6*   CBC:  Recent Labs  02/03/15 1606  02/25/15 1503 02/26/15 0720 02/27/15 0607 02/28/15 0540  WBC 4.2  < > 7.4 7.5 6.9 11.3*  NEUTROABS 1.3*  --  5.5  --   --  8.3*  HGB 11.8*  < > 8.0* 7.0* 6.0* 9.5*  HCT 33.2*  < > 22.4* 19.7* 16.9* 26.8*  MCV 81.8  < > 81.8 82.8 84.1 82.7  PLT 204  < > 297 273 247 270  < > = values in this interval not displayed.  Dg Chest 2 View  02/03/2015   CLINICAL DATA:  59 year old female with a history of scheduled right hip arthroplasty  EXAM: CHEST - 2 VIEW  COMPARISON:  None.  FINDINGS: Cardiomediastinal silhouette projects within normal limits  in size and contour. No confluent airspace disease, pneumothorax, or pleural effusion.  No displaced fracture.  Unremarkable appearance of the upper abdomen.  IMPRESSION: No radiographic evidence of acute cardiopulmonary disease.  Signed,  Yvone Neu. Loreta Ave, DO  [facility]  [facility]   Electronically Signed   By: Gilmer Mor D.O.   On: 02/03/2015 15:41   Ct Abdomen Pelvis W Contrast  02/25/2015   CLINICAL DATA:  Diffuse abdominal pain, right hip surgery 02/15/2015  EXAM: CT ABDOMEN AND PELVIS WITH CONTRAST  TECHNIQUE: Multidetector CT imaging of the abdomen and pelvis was performed using the standard protocol following bolus administration of intravenous contrast.  CONTRAST:  OMNIPAQUE IOHEXOL 300 MG/ML  SOLN  COMPARISON:  12/02/2008  FINDINGS: Sagittal images of the spine shows mild degenerative changes thoracolumbar spine. There is mild about 3 mm anterolisthesis L4 on L5 vertebral body. Facet degenerative changes noted L3-L4 and L5 level.  The lung bases are unremarkable. Enhanced liver is unremarkable. Enhanced pancreas, spleen and adrenal glands are unremarkable. There is mild bilateral lobulated renal contour. Multifocal cortical scarring noted within right kidney. No hydronephrosis or hydroureter. Delayed renal images shows bilateral renal symmetrical excretion.  No aortic aneurysm.  No small bowel obstruction. No ascites or free air. No adenopathy. There is a small left paramedian supraumbilical hernia containing omental fat and small amount of fluid measures about 2 by 1.6 cm. There is no evidence of acute complication.  No pericecal inflammation. The terminal ileum is unremarkable. Normal appendix is partially visualized in axial image 54  Moderate stool noted in proximal sigmoid colon. Evaluation of the pelvis is markedly limited by extensive metallic artifacts from bilateral hip prosthesis.  The patient is status post hysterectomy. There is a  subcutaneous fluid collection just lateral to right hip joint measures about 4.4 by 3.4 cm. This most likely is postsurgical in nature. No definite evidence of abscess. The urinary bladder is unremarkable. Mild degenerative changes pubic symphysis.  IMPRESSION: 1. There is no evidence of acute inflammatory process within abdomen or pelvis. 2. There is a small left paramedian supraumbilical hernia containing omental fat and small amount of fluid without evidence of acute complication. 3. Normal appendix.  No pericecal inflammation. 4. Bilateral lobulated renal contour. Probable cortical scarring within right kidney. Bilateral renal symmetrical excretion. No hydronephrosis or hydroureter. 5. Small amount of subcutaneous fluid is noted just lateral to right hip joint probable postsurgical in nature. No definite evidence of abscess. 6. Degenerative changes lumbar spine as described above.   Electronically Signed   By: Natasha Mead M.D.   On: 02/25/2015 17:48  Dg Pelvis Portable  02/15/2015   CLINICAL DATA:  Status post right total hip arthroplasty.  EXAM: PORTABLE PELVIS 1-2 VIEWS  COMPARISON:  December 23, 2011.  FINDINGS: Patient is status post interval placement of right total hip arthroplasty. The femoral and acetabular components appear to be well situated. Left total hip arthroplasty noted on prior for exam is stable. No fracture or dislocation is noted.  IMPRESSION: Status post interval placement of right total hip arthroplasty.   Electronically Signed   By: Lupita RaiderJames  Green Jr, M.D.   On: 02/15/2015 16:17   Dg Chest Port 1 View  02/25/2015   CLINICAL DATA:  Postoperative fever  EXAM: PORTABLE CHEST - 1 VIEW  COMPARISON:  08/18/2012  FINDINGS: Cardiac shadow is stable. No focal infiltrate is seen. Some scarring is again noted in the left lung base no acute bony abnormality is noted.  IMPRESSION: No active disease.   Electronically Signed   By: Alcide CleverMark  Lukens M.D.   On: 02/25/2015 21:48     ASSESSMENT/PLAN:  Intractable nausea and vomiting - stable; continue Phenergan 25 mg PO Q 6 hours PRN  Normocytic anemia - hemoglobin 9.5; S/P transfusion of 2 units PRBC Osteoarthritis S/P right total hip arthroplasty - for rehabilitation; follow-up with Dr. Turner Danielsowan, orthopedic surgeon; continue Tylenol 650 mg 2 tabs = 1300 mg by mouth twice a day when necessary and increase tramadol 50 mg 1 tab by mouth every 4 hours when necessary for pain; aspirin 325 mg by mouth twice a day for DVT prophylaxis; oxycodone 5 mg IR 1 tab by mouth every 4 hours when necessary and tramadol 50 mg 1 tab by mouth every 4 hours when necessary for pain Recurrent major depression with psychotic features - continue Celexa 40 mg 1 tab by mouth daily and decrease Seroquel to 50 mg 1 tab by mouth daily at bedtime Generalized anxiety disorder - mood is stable; continue Klonopin 0.5 mg 1 tab by mouth twice a day Hypertension - continue clonidine 0.3 mg 1 tab by mouth twice a day, decrease triamterene HCTZ 37. 5-25 mg 1 tabs by mouth daily, losartan 100 mg by mouth daily and metoprolol succinate 100 mg in 24-hour 1 tablet by mouth every morning; and Cardura 8 mg PO Q HS;  BP/heart rate every shift 1 week Constipation - start  Prune juice on trays for breakfast and dinner GERD - continue Prilosec 40 mg by mouth every morning Hypokalemia - K 3.4; continue KCL 20 meq 1 PO Q D   Goals of care:  Short-term rehabilitation   Labs/test ordered:  CBC, BMP   Spent 50 minutes in patient care.    [facility]tr[facility]INA-VARGAS,MONINA, NP [facility] [phone]

## 2015-03-03 ENCOUNTER — Non-Acute Institutional Stay (SKILLED_NURSING_FACILITY): Payer: Medicare Other | Admitting: Internal Medicine

## 2015-03-03 DIAGNOSIS — T814XXA Infection following a procedure, initial encounter: Secondary | ICD-10-CM

## 2015-03-03 DIAGNOSIS — I10 Essential (primary) hypertension: Secondary | ICD-10-CM

## 2015-03-03 DIAGNOSIS — K219 Gastro-esophageal reflux disease without esophagitis: Secondary | ICD-10-CM | POA: Diagnosis not present

## 2015-03-03 DIAGNOSIS — K2970 Gastritis, unspecified, without bleeding: Secondary | ICD-10-CM

## 2015-03-03 DIAGNOSIS — E876 Hypokalemia: Secondary | ICD-10-CM | POA: Diagnosis not present

## 2015-03-03 DIAGNOSIS — K5900 Constipation, unspecified: Secondary | ICD-10-CM | POA: Diagnosis not present

## 2015-03-03 DIAGNOSIS — D649 Anemia, unspecified: Secondary | ICD-10-CM

## 2015-03-03 DIAGNOSIS — Z96641 Presence of right artificial hip joint: Secondary | ICD-10-CM | POA: Diagnosis not present

## 2015-03-03 DIAGNOSIS — K5909 Other constipation: Secondary | ICD-10-CM

## 2015-03-03 DIAGNOSIS — F418 Other specified anxiety disorders: Secondary | ICD-10-CM

## 2015-03-03 DIAGNOSIS — IMO0001 Reserved for inherently not codable concepts without codable children: Secondary | ICD-10-CM

## 2015-03-03 NOTE — Progress Notes (Signed)
Patient ID: Bailey [name], female   DOB: [birth year], 59 y.o.   MRN: [id number]     [facility]  PCP: Bailey HousekeeperHUSAIN,KARRAR, MD  Code Status: Full Code   Allergies  Allergen Reactions  . Codeine Nausea Only and Other (See Comments)    headaches  . Sulfa Antibiotics Nausea Only    Upset stomach, headaches  . Sulfa Drugs Cross Reactors Other (See Comments)    Its been a long time ago    Chief Complaint  Patient presents with  . Readmit To SNF    readmission     HPI:  59 year old patient is here for short term rehabilitation post hospital admission from 02/25/15- 02/28/15 with intractable nausea and vomiting. It was thought to be from viral gastritis. She responded well to PPI and anti emetics. She also received 2 u prbc with drop in her hemoglobin. She was in the facility prior to this admission undergoing rehabilitation post right hip arthroplasty. She has PMH of hypertension, stroke in 2005 with right-sided weakness, anxiety, asthma, goiter removed at age 59 and major depression. She is seen in her room today. she has been constipated and has not had a bowel movement since her recent hospitalization. She mentions being chronically constipated.   Review of Systems:  Constitutional: Negative for fever, chills, diaphoresis.  HENT: Negative for headache, congestion, nasal discharge Eyes: Negative for eye pain, blurred vision, double vision and discharge.  Respiratory: Negative for cough, shortness of breath and wheezing.   Cardiovascular: Negative for chest pain, palpitations, leg swelling.  Gastrointestinal: Negative for heartburn, nausea, vomiting, abdominal pain Genitourinary: Negative for dysuria  Musculoskeletal: Negative for back pain, falls Skin: Negative for itching, rash.  Neurological: Negative for dizziness, tingling, focal weakness Psychiatric/Behavioral: Negative for depression  Past Medical History  Diagnosis Date  . Hypertension     all BP meds. managed  by Dr. Eula ListenHussain    . Stroke     2005, R sided weakness   . Pneumonia     while [facility] told that she had pneumonia & was isolated   . Arthritis     OA- R knee, L hip   . Anxiety     "Breakdown" in the 1990's, hosp. for >1 wk. at [facility]    . Incontinence of urine     weak bladder  . Hypertension   . Stroke 2005    ? weakness in hand and arms per mother. patient unsure  . Asthma     years ago. "no problems in a long while."  . Anxiety   . Depression   . GERD (gastroesophageal reflux disease)   . Constipation   . Goiter     removed  at age 59   Past Surgical History  Procedure Laterality Date  . Thyroidectomy, partial    . Tubal ligation    . Dilation and curettage of uterus      1980's  . Total knee arthroplasty  10/21/2011    Procedure: TOTAL KNEE ARTHROPLASTY;  Surgeon: Nestor LewandowskyFrank J Rowan, MD;  Location: [facility] OR;  Service: Orthopedics;  Laterality: Right;  DEPUY SIGMA RP  . Joint replacement  10/2011    Rt knee  . Total hip arthroplasty  12/23/2011    Procedure: TOTAL HIP ARTHROPLASTY;  Surgeon: Nestor LewandowskyFrank J Rowan, MD;  Location: [facility] OR;  Service: Orthopedics;  Laterality: Left;  DEPUY/PINNACLE CUPS/SROM STEM  . Colonoscopy    . Joint replacement Right  knee  . Total hip arthroplasty Left   . Abdominal hysterectomy    . Tonsillectomy    . Goiter      removed age 59  . Total hip arthroplasty Right 02/15/2015    Procedure: TOTAL HIP ARTHROPLASTY;  Surgeon: Gean Birchwood, MD;  Location: [facility] OR;  Service: Orthopedics;  Laterality: Right;   Social History:   reports that she has never smoked. She does not have any smokeless tobacco history on file. She reports that she drinks alcohol. She reports that she does not use illicit drugs.  Family History  Problem Relation Age of Onset  . Anesthesia problems Neg Hx   . Hypotension Neg Hx   . Malignant hyperthermia Neg Hx   . Pseudochol deficiency Neg Hx   . Alcohol abuse Neg Hx   . Anxiety disorder Neg Hx   . Bipolar  disorder Neg Hx   . Depression Neg Hx   . Dementia Neg Hx     Medications: Patient's Medications  New Prescriptions   No medications on file  Previous Medications   ACETAMINOPHEN (TYLENOL) 325 MG TABLET    Take 1,300 mg by mouth 2 (two) times daily as needed (pain).   ASPIRIN EC 325 MG TABLET    Take 1 tablet (325 mg total) by mouth 2 (two) times daily.   CHOLECALCIFEROL (VITAMIN D) 2000 UNITS TABLET    Take 2,000 Units by mouth daily.   CITALOPRAM (CELEXA) 40 MG TABLET    Take 40 mg by mouth daily.    CLONAZEPAM (KLONOPIN) 0.5 MG TABLET    Take 0.5 mg by mouth 2 (two) times daily.   CLONIDINE (CATAPRES) 0.3 MG TABLET    Take 0.3 mg by mouth 2 (two) times daily.   DOXAZOSIN (CARDURA) 8 MG TABLET    Take 8 mg by mouth at bedtime.   FLAXSEED, LINSEED, (FLAX SEED OIL PO)    Take 1,200 mg by mouth daily.   LOSARTAN (COZAAR) 100 MG TABLET    Take 100 mg by mouth daily.   METOPROLOL (TOPROL-XL) 100 MG 24 HR TABLET    Take 100 mg by mouth daily.    OMEPRAZOLE (PRILOSEC) 20 MG CAPSULE    Take 40 mg by mouth daily.   OXYCODONE (OXY IR/ROXICODONE) 5 MG IMMEDIATE RELEASE TABLET    Take 1 tablet (5 mg total) by mouth every 4 (four) hours as needed for moderate pain.   POTASSIUM CHLORIDE SA (K-DUR,KLOR-CON) 20 MEQ TABLET    Take 20 mEq by mouth 2 (two) times daily.    PROMETHAZINE (PHENERGAN) 25 MG TABLET    Take 25 mg by mouth every 6 (six) hours as needed for nausea or vomiting.   QUETIAPINE (SEROQUEL) 50 MG TABLET    Take 1 tablet (50 mg total) by mouth at bedtime.   TETRAHYDROZOLINE HCL (VISINE OP)    Place 1 drop into both eyes daily as needed (dry eyes).   TRAMADOL (ULTRAM) 50 MG TABLET    Take 1 tablet (50 mg total) by mouth every 4 (four) hours as needed for moderate pain.   TRIAMTERENE-HYDROCHLOROTHIAZIDE (MAXZIDE-25) 37.5-25 MG PER TABLET    Take 1 tablet by mouth every evening.   VITAMIN C (ASCORBIC ACID) 500 MG TABLET    Take 500 mg by mouth daily.  Modified Medications   No medications  on file  Discontinued Medications   OMEPRAZOLE (PRILOSEC) 40 MG CAPSULE    Take 40 mg by mouth daily before breakfast.  Physical Exam:  Filed Vitals:   03/03/15 1118  BP: 134/71  Pulse: 87  Temp: 96.2 F (35.7 C)  TempSrc: Oral  Resp: 18  Height:  (1.575 m)  Weight: 214 lb (97.07 kg)  SpO2: 95%    General- elderly female, in no acute distress Head- normocephalic, atraumatic Throat- moist mucus membrane Eyes- PERRLA, EOMI, no pallor, no icterus, no discharge, normal conjunctiva, normal sclera Neck- no cervical lymphadenopathy Cardiovascular- normal s1,s2, no murmurs, palpable dorsalis pedis and radial pulses, right leg edema 1+, left leg trace edema Respiratory- bilateral clear to auscultation, no wheeze, no rhonchi, no crackles, no use of accessory muscles Abdomen- bowel sounds present, soft, non tender Musculoskeletal- able to move all 4 extremities Neurological- no focal deficit Skin- warm and dry, hypopigmented area in sacral area which is chronic per patient. Right hip surgical incision area dressing is soaked, on removal of dressing incision area is erythematous and has serenginous drainage Psychiatry- alert and oriented to person, place and time, normal mood and affect    Labs reviewed: Basic Metabolic Panel:  Recent Labs  16/10/96 0720 02/27/15 0607 02/28/15 0540  NA 144 145 139  K 2.6* 4.1 3.4*  CL 106 112* 105  CO2 GLUCOSE 108* 104* 93  BUN 8 7 <5*  CREATININE 0.89 0.96 0.93  CALCIUM 7.9* 7.9* 8.1*  MG  --  1.3* 1.6*   Liver Function Tests:  Recent Labs  02/25/15 1503 02/26/15 0720 02/27/15 0607  AST ALT ALKPHOS 74 66 64  BILITOT 1.0 1.2 1.1  PROT 6.2* 5.4* 5.3*  ALBUMIN 3.1* 2.8* 2.7*    Recent Labs  02/25/15 1503  LIPASE 19*   No results for input(s): AMMONIA in the last 8760 hours. CBC:  Recent Labs  02/03/15 1606  02/25/15 1503 02/26/15 0720 02/27/15 0607 02/28/15 0540  WBC 4.2  < >  7.4 7.5 6.9 11.3*  NEUTROABS 1.3*  --  5.5  --   --  8.3*  HGB 11.8*  < > 8.0* 7.0* 6.0* 9.5*  HCT 33.2*  < > 22.4* 19.7* 16.9* 26.8*  MCV 81.8  < > 81.8 82.8 84.1 82.7  PLT 204  < > 297 273 247 270  < > = values in this interval not displayed.   Radiological Exams:  Ct Abdomen Pelvis W Contrast  02/25/2015   CLINICAL DATA:  Diffuse abdominal pain, right hip surgery 02/15/2015  EXAM: CT ABDOMEN AND PELVIS WITH CONTRAST  TECHNIQUE: Multidetector CT imaging of the abdomen and pelvis was performed using the standard protocol following bolus administration of intravenous contrast.  CONTRAST:  OMNIPAQUE IOHEXOL 300 MG/ML  SOLN  COMPARISON:  12/02/2008  FINDINGS: Sagittal images of the spine shows mild degenerative changes thoracolumbar spine. There is mild about 3 mm anterolisthesis L4 on L5 vertebral body. Facet degenerative changes noted L3-L4 and L5 level.  The lung bases are unremarkable. Enhanced liver is unremarkable. Enhanced pancreas, spleen and adrenal glands are unremarkable. There is mild bilateral lobulated renal contour. Multifocal cortical scarring noted within right kidney. No hydronephrosis or hydroureter. Delayed renal images shows bilateral renal symmetrical excretion.  No aortic aneurysm.  No small bowel obstruction. No ascites or free air. No adenopathy. There is a small left paramedian supraumbilical hernia containing omental fat and small amount of fluid measures about 2 by 1.6 cm. There is no evidence of acute complication.  No pericecal inflammation. The terminal ileum is unremarkable. Normal  appendix is partially visualized in axial image 54  Moderate stool noted in proximal sigmoid colon. Evaluation of the pelvis is markedly limited by extensive metallic artifacts from bilateral hip prosthesis.  The patient is status post hysterectomy. There is a subcutaneous fluid collection just lateral to right hip joint measures about 4.4 by 3.4 cm. This most likely is postsurgical in  nature. No definite evidence of abscess. The urinary bladder is unremarkable. Mild degenerative changes pubic symphysis.  IMPRESSION: 1. There is no evidence of acute inflammatory process within abdomen or pelvis. 2. There is a small left paramedian supraumbilical hernia containing omental fat and small amount of fluid without evidence of acute complication. 3. Normal appendix.  No pericecal inflammation. 4. Bilateral lobulated renal contour. Probable cortical scarring within right kidney. Bilateral renal symmetrical excretion. No hydronephrosis or hydroureter. 5. Small amount of subcutaneous fluid is noted just lateral to right hip joint probable postsurgical in nature. No definite evidence of abscess. 6. Degenerative changes lumbar spine as described above.   Electronically Signed   By: Natasha Mead M.D.   On: 02/25/2015 17:48   Dg Pelvis Portable  02/15/2015   CLINICAL DATA:  Status post right total hip arthroplasty.  EXAM: PORTABLE PELVIS 1-2 VIEWS  COMPARISON:  December 23, 2011.  FINDINGS: Patient is status post interval placement of right total hip arthroplasty. The femoral and acetabular components appear to be well situated. Left total hip arthroplasty noted on prior for exam is stable. No fracture or dislocation is noted.  IMPRESSION: Status post interval placement of right total hip arthroplasty.   Electronically Signed   By: Lupita Raider, M.D.   On: 02/15/2015 16:17   Dg Chest Port 1 View  02/25/2015   CLINICAL DATA:  Postoperative fever  EXAM: PORTABLE CHEST - 1 VIEW  COMPARISON:  08/18/2012  FINDINGS: Cardiac shadow is stable. No focal infiltrate is seen. Some scarring is again noted in the left lung base no acute bony abnormality is noted.  IMPRESSION: No active disease.   Electronically Signed   By: Alcide Clever M.D.   On: 02/25/2015 21:48     Assessment/Plan  Viral gastritis Improved. Denies any nausea this visit. Continue prn phenergan for now and monitor  Normocytic anemia    hemoglobin 9.5. Monitor h&h. S/P transfusion of 2 units PRBC  Right surgical incision infection Start doxycycline 100 mg bid x 5 days with florastor for now and to make orthopedics appointment ASAP. Monitor wbc and temp curve  Right hip Osteoarthritis  S/P right total hip arthroplasty. Will have patient work with PT/OT as tolerated to regain strength and restore function.  Fall precautions are in place. To make orthopedics f/u ASAP. Continue aspirin 325 mg bid for dvt prophylaxis and oxyIR with tramadol for pain.   Constipation Chronic, no signs of bowel obstruction on exam. Continue prune hjuice and colace for now  HTN Stable. Continue triamterene hctz, losartan, metoprolol and clonidine current regimen, no changes made. Monitor bp  Hypokalemia Continue kcl supplement and monitor bmp  gerd Continue prilosec 40 mg daily for now and monitor  Depression and anxiety Stable mood this visit. Continue Celexa 40 mg daily and Seroquel 50 mg daily with Klonopin 0.5 mg bid and monitor   Goals of care: short term rehabilitation   Labs/tests ordered: cbc, bmp  Family/ staff Communication: reviewed care plan with patient and nursing supervisor    Oneal Grout, MD  [facility] [phone] (Monday-Friday 8 am - 5 pm) [phone] (afterhours)

## 2015-03-04 LAB — CULTURE, BLOOD (ROUTINE X 2)
CULTURE: NO GROWTH
Culture: NO GROWTH

## 2015-03-17 ENCOUNTER — Encounter: Payer: Self-pay | Admitting: Adult Health

## 2015-03-17 ENCOUNTER — Non-Acute Institutional Stay (SKILLED_NURSING_FACILITY): Payer: Medicare Other | Admitting: Adult Health

## 2015-03-17 DIAGNOSIS — D649 Anemia, unspecified: Secondary | ICD-10-CM | POA: Diagnosis not present

## 2015-03-17 DIAGNOSIS — R1110 Vomiting, unspecified: Secondary | ICD-10-CM | POA: Diagnosis not present

## 2015-03-17 DIAGNOSIS — F333 Major depressive disorder, recurrent, severe with psychotic symptoms: Secondary | ICD-10-CM | POA: Diagnosis not present

## 2015-03-17 DIAGNOSIS — E876 Hypokalemia: Secondary | ICD-10-CM

## 2015-03-17 DIAGNOSIS — K5900 Constipation, unspecified: Secondary | ICD-10-CM

## 2015-03-17 DIAGNOSIS — F411 Generalized anxiety disorder: Secondary | ICD-10-CM | POA: Diagnosis not present

## 2015-03-17 DIAGNOSIS — I10 Essential (primary) hypertension: Secondary | ICD-10-CM

## 2015-03-17 DIAGNOSIS — M1611 Unilateral primary osteoarthritis, right hip: Secondary | ICD-10-CM

## 2015-03-17 DIAGNOSIS — K219 Gastro-esophageal reflux disease without esophagitis: Secondary | ICD-10-CM

## 2015-03-17 NOTE — Progress Notes (Signed)
Patient ID: Bailey [name], female   DOB: 11/26/1955, 59 y.o.   MRN: [id number]   03/17/2015  Facility:  Nursing Home Location:  [facility] Room Number: 408-P LEVEL OF CARE:  SNF (31)   Chief Complaint  Patient presents with  . Discharge Note    Intractable nausea and vomiting, anemia, hypokalemia, hypertension, osteoarthritis S/P right total hip arthroplasty, depression with psychotic features, anxiety and GERD    HISTORY OF PRESENT ILLNESS:  This is a 59 year old female who is for discharge home with Home health PT for gait, therapy exercises, OT for self care and home management and Nursing for disease management. She has been re-admitted to [facility] on 02/28/15 from [facility]. She has PMH of hypertension, stroke in 2005 with right-sided weakness, anxiety, asthma, goiter removed at age 15 and major depression. She was having short-term rehabilitation @ [facility] after having right total hip arthroplasty. She was sent to the hospital from [facility] due to intractable nausea and vomiting. It was thought to be viral gastritis. She was treated conservatively and had transfusion of 2 units PRBC.  Patient was admitted to this facility for short-term rehabilitation after the patient's recent hospitalization.  Patient has completed SNF rehabilitation and therapy has cleared the patient for discharge.   PAST MEDICAL HISTORY:  Past Medical History  Diagnosis Date  . Hypertension     all BP meds. managed by Dr. Eula Listen    . Stroke     2005, R sided weakness   . Pneumonia     while at [facility] 1990's told that she had pneumonia & was isolated   . Arthritis     OA- R knee, L hip   . Anxiety     "Breakdown" in the 1990's, hosp. for >1 wk. at [facility]    . Incontinence of urine     weak bladder  . Hypertension   . Stroke 2005    ? weakness in hand and arms per mother. patient unsure  . Asthma     years ago. "no problems in a long  while."  . Anxiety   . Depression   . GERD (gastroesophageal reflux disease)   . Constipation   . Goiter     removed  at age 54    CURRENT MEDICATIONS: Reviewed per MAR/see medication list  Allergies  Allergen Reactions  . Codeine Nausea Only and Other (See Comments)    headaches  . Sulfa Antibiotics Nausea Only    Upset stomach, headaches  . Sulfa Drugs Cross Reactors Other (See Comments)    Its been a long time ago     REVIEW OF SYSTEMS:  GENERAL: no change in appetite, no fatigue, no weight changes, no fever, chills or weakness RESPIRATORY: no cough, SOB, DOE, wheezing, hemoptysis CARDIAC: no chest pain, or palpitations GI: no abdominal pain, diarrhea, heart burn, nausea or vomiting  PHYSICAL EXAMINATION  GENERAL: no acute distress, obese SKIN:  Right hip surgical incision has dry dressing, no erythema NECK: supple, trachea midline, no neck masses, no thyroid tenderness, no thyromegaly LYMPHATICS: no LAN in the neck, no supraclavicular LAN RESPIRATORY: breathing is even & unlabored, BS CTAB CARDIAC: RRR, no murmur,no extra heart sounds, BLE edema 1+ edema GI: abdomen soft, normal BS, no masses, no tenderness, no hepatomegaly, no splenomegaly EXTREMITIES:  Able to move X 4 extremities PSYCHIATRIC: the patient is alert & oriented to person, affect & behavior appropriate  LABS/RADIOLOGY: Labs reviewed: 03/13/15  WBC 3.6 hemoglobin 8.7 hematocrit 36.0 MCV 86.1 platelet 328 03/07/15  WBC 3.3 hemoglobin 7.8 hematocrit 23.5 MCV 85.8 platelet 308 sodium 145 potassium 3.4 glucose 82 BUN 12 creatinine 0.88 calcium 8.4 03/03/15  WBC 3.9 hemoglobin 7.5 hematocrit 21.8 MCV 86.5 platelet 238 sodium 143 potassium 3.5 glucose 95 BUN 11 creatinine 0.94 calcium 8.0 Basic Metabolic Panel:  Recent Labs  40/98/11 0720 02/27/15 0607 02/28/15 0540  NA 144 145 139  K 2.6* 4.1 3.4*  CL 106 112* 105  CO2 GLUCOSE 108* 104* 93  BUN 8 7 <5*  CREATININE 0.89 0.96 0.93  CALCIUM  7.9* 7.9* 8.1*  MG  --  1.3* 1.6*   CBC:  Recent Labs  02/03/15 1606  02/25/15 1503 02/26/15 0720 02/27/15 0607 02/28/15 0540  WBC 4.2  < > 7.4 7.5 6.9 11.3*  NEUTROABS 1.3*  --  5.5  --   --  8.3*  HGB 11.8*  < > 8.0* 7.0* 6.0* 9.5*  HCT 33.2*  < > 22.4* 19.7* 16.9* 26.8*  MCV 81.8  < > 81.8 82.8 84.1 82.7  PLT 204  < > 297 273 247 270  < > = values in this interval not displayed.  Ct Abdomen Pelvis W Contrast  02/25/2015   CLINICAL DATA:  Diffuse abdominal pain, right hip surgery 02/15/2015  EXAM: CT ABDOMEN AND PELVIS WITH CONTRAST  TECHNIQUE: Multidetector CT imaging of the abdomen and pelvis was performed using the standard protocol following bolus administration of intravenous contrast.  CONTRAST:  OMNIPAQUE IOHEXOL 300 MG/ML  SOLN  COMPARISON:  12/02/2008  FINDINGS: Sagittal images of the spine shows mild degenerative changes thoracolumbar spine. There is mild about 3 mm anterolisthesis L4 on L5 vertebral body. Facet degenerative changes noted L3-L4 and L5 level.  The lung bases are unremarkable. Enhanced liver is unremarkable. Enhanced pancreas, spleen and adrenal glands are unremarkable. There is mild bilateral lobulated renal contour. Multifocal cortical scarring noted within right kidney. No hydronephrosis or hydroureter. Delayed renal images shows bilateral renal symmetrical excretion.  No aortic aneurysm.  No small bowel obstruction. No ascites or free air. No adenopathy. There is a small left paramedian supraumbilical hernia containing omental fat and small amount of fluid measures about 2 by 1.6 cm. There is no evidence of acute complication.  No pericecal inflammation. The terminal ileum is unremarkable. Normal appendix is partially visualized in axial image 54  Moderate stool noted in proximal sigmoid colon. Evaluation of the pelvis is markedly limited by extensive metallic artifacts from bilateral hip prosthesis.  The patient is status post hysterectomy. There is a  subcutaneous fluid collection just lateral to right hip joint measures about 4.4 by 3.4 cm. This most likely is postsurgical in nature. No definite evidence of abscess. The urinary bladder is unremarkable. Mild degenerative changes pubic symphysis.  IMPRESSION: 1. There is no evidence of acute inflammatory process within abdomen or pelvis. 2. There is a small left paramedian supraumbilical hernia containing omental fat and small amount of fluid without evidence of acute complication. 3. Normal appendix.  No pericecal inflammation. 4. Bilateral lobulated renal contour. Probable cortical scarring within right kidney. Bilateral renal symmetrical excretion. No hydronephrosis or hydroureter. 5. Small amount of subcutaneous fluid is noted just lateral to right hip joint probable postsurgical in nature. No definite evidence of abscess. 6. Degenerative changes lumbar spine as described above.   Electronically Signed   By: Natasha Mead M.D.   On: 02/25/2015 17:48   Dg Chest  Port 1 View  02/25/2015   CLINICAL DATA:  Postoperative fever  EXAM: PORTABLE CHEST - 1 VIEW  COMPARISON:  08/18/2012  FINDINGS: Cardiac shadow is stable. No focal infiltrate is seen. Some scarring is again noted in the left lung base no acute bony abnormality is noted.  IMPRESSION: No active disease.   Electronically Signed   By: Alcide Clever M.D.   On: 02/25/2015 21:48    ASSESSMENT/PLAN:  Intractable nausea and vomiting - Resolved Normocytic anemia - hemoglobin 8.7; S/P transfusion of 2 units PRBC Osteoarthritis S/P right total hip arthroplasty - for Home health PT, OT and Nursing; follow-up with Dr. Turner Daniels, orthopedic surgeon; continue Tylenol 650 mg 2 tabs = 1300 mg by mouth twice a day when necessary and tramadol 50 mg 1 tab by mouth every 4 hours when necessary for pain; aspirin 325 mg by mouth twice a day for DVT prophylaxis; oxycodone 5 mg IR 1 tab by mouth every 4 hours when necessary and tramadol 50 mg 1 tab by mouth every 4 hours when  necessary for pain Recurrent major depression with psychotic features - continue Celexa 40 mg 1 tab by mouth daily and decrease Seroquel to 50 mg 1 tab by mouth daily at bedtime Generalized anxiety disorder - mood is stable; continue Klonopin 0.5 mg 1 tab by mouth twice a day Hypertension - continue clonidine 0.3 mg 1 tab by mouth twice a day, triamterene HCTZ 37. 5-25 mg 1 tabs by mouth daily, losartan 100 mg by mouth daily, metoprolol succinate 100 mg in 24-hour 1 tablet by mouth every morning; and Cardura 8 mg PO Q HS Constipation - continue Prune juice on trays for breakfast and dinner GERD - continue Prilosec 40 mg by mouth every morning Hypokalemia - K 3.4; continue KCL 20 meq 1 PO Q D    I have filled out patient's discharge paperwork and written prescriptions.  Patient will receive home health PT, OT and Nursing.  Total discharge time: Less than 30 minutes  Discharge time involved coordination of the discharge process with Child psychotherapist, nursing staff and therapy department. Medical justification for home health services verified.     [facility], NP [employer] [phone]

## 2015-05-16 ENCOUNTER — Inpatient Hospital Stay (HOSPITAL_COMMUNITY)
Admission: EM | Admit: 2015-05-16 | Discharge: 2015-05-18 | DRG: 897 | Disposition: A | Discharge status: Home / Self Care | Payer: No Typology Code available for payment source | Attending: Internal Medicine | Admitting: Internal Medicine

## 2015-05-16 ENCOUNTER — Emergency Department (HOSPITAL_COMMUNITY): Payer: No Typology Code available for payment source

## 2015-05-16 ENCOUNTER — Encounter (HOSPITAL_COMMUNITY): Payer: Self-pay | Admitting: *Deleted

## 2015-05-16 DIAGNOSIS — E876 Hypokalemia: Secondary | ICD-10-CM | POA: Diagnosis present

## 2015-05-16 DIAGNOSIS — R55 Syncope and collapse: Secondary | ICD-10-CM | POA: Diagnosis present

## 2015-05-16 DIAGNOSIS — Z90710 Acquired absence of both cervix and uterus: Secondary | ICD-10-CM

## 2015-05-16 DIAGNOSIS — I10 Essential (primary) hypertension: Secondary | ICD-10-CM | POA: Diagnosis present

## 2015-05-16 DIAGNOSIS — I69351 Hemiplegia and hemiparesis following cerebral infarction affecting right dominant side: Secondary | ICD-10-CM | POA: Diagnosis not present

## 2015-05-16 DIAGNOSIS — F1910 Other psychoactive substance abuse, uncomplicated: Secondary | ICD-10-CM | POA: Diagnosis not present

## 2015-05-16 DIAGNOSIS — R7989 Other specified abnormal findings of blood chemistry: Secondary | ICD-10-CM

## 2015-05-16 DIAGNOSIS — J45909 Unspecified asthma, uncomplicated: Secondary | ICD-10-CM | POA: Diagnosis present

## 2015-05-16 DIAGNOSIS — Z7982 Long term (current) use of aspirin: Secondary | ICD-10-CM

## 2015-05-16 DIAGNOSIS — F14188 Cocaine abuse with other cocaine-induced disorder: Secondary | ICD-10-CM | POA: Diagnosis present

## 2015-05-16 DIAGNOSIS — K219 Gastro-esophageal reflux disease without esophagitis: Secondary | ICD-10-CM | POA: Diagnosis present

## 2015-05-16 DIAGNOSIS — F419 Anxiety disorder, unspecified: Secondary | ICD-10-CM | POA: Diagnosis present

## 2015-05-16 DIAGNOSIS — Z9114 Patient's other noncompliance with medication regimen: Secondary | ICD-10-CM | POA: Diagnosis present

## 2015-05-16 DIAGNOSIS — E042 Nontoxic multinodular goiter: Secondary | ICD-10-CM | POA: Diagnosis present

## 2015-05-16 DIAGNOSIS — R778 Other specified abnormalities of plasma proteins: Secondary | ICD-10-CM | POA: Diagnosis present

## 2015-05-16 DIAGNOSIS — Z96651 Presence of right artificial knee joint: Secondary | ICD-10-CM | POA: Diagnosis present

## 2015-05-16 DIAGNOSIS — R748 Abnormal levels of other serum enzymes: Secondary | ICD-10-CM | POA: Diagnosis present

## 2015-05-16 DIAGNOSIS — R911 Solitary pulmonary nodule: Secondary | ICD-10-CM | POA: Diagnosis present

## 2015-05-16 DIAGNOSIS — F329 Major depressive disorder, single episode, unspecified: Secondary | ICD-10-CM | POA: Diagnosis present

## 2015-05-16 DIAGNOSIS — E860 Dehydration: Secondary | ICD-10-CM | POA: Diagnosis present

## 2015-05-16 DIAGNOSIS — I421 Obstructive hypertrophic cardiomyopathy: Secondary | ICD-10-CM | POA: Diagnosis present

## 2015-05-16 DIAGNOSIS — M25551 Pain in right hip: Secondary | ICD-10-CM | POA: Diagnosis present

## 2015-05-16 DIAGNOSIS — Z79899 Other long term (current) drug therapy: Secondary | ICD-10-CM

## 2015-05-16 DIAGNOSIS — Z96642 Presence of left artificial hip joint: Secondary | ICD-10-CM | POA: Diagnosis present

## 2015-05-16 DIAGNOSIS — N179 Acute kidney failure, unspecified: Secondary | ICD-10-CM | POA: Diagnosis present

## 2015-05-16 LAB — CBC WITH DIFFERENTIAL/PLATELET
BASOS ABS: 0 10*3/uL (ref 0.0–0.1)
BASOS PCT: 0 % (ref 0–1)
Eosinophils Absolute: 0.1 10*3/uL (ref 0.0–0.7)
Eosinophils Relative: 2 % (ref 0–5)
HEMATOCRIT: 33 % — AB (ref 36.0–46.0)
HEMOGLOBIN: 12 g/dL (ref 12.0–15.0)
Lymphocytes Relative: 19 % (ref 12–46)
Lymphs Abs: 1.3 10*3/uL (ref 0.7–4.0)
MCH: 28.4 pg (ref 26.0–34.0)
MCHC: 36.4 g/dL — ABNORMAL HIGH (ref 30.0–36.0)
MCV: 78 fL (ref 78.0–100.0)
MONOS PCT: 12 % (ref 3–12)
Monocytes Absolute: 0.8 10*3/uL (ref 0.1–1.0)
NEUTROS ABS: 4.5 10*3/uL (ref 1.7–7.7)
NEUTROS PCT: 67 % (ref 43–77)
Platelets: 245 10*3/uL (ref 150–400)
RBC: 4.23 MIL/uL (ref 3.87–5.11)
RDW: 14.3 % (ref 11.5–15.5)
WBC: 6.8 10*3/uL (ref 4.0–10.5)

## 2015-05-16 LAB — TROPONIN I: Troponin I: 0.04 ng/mL — ABNORMAL HIGH (ref <0.031)

## 2015-05-16 LAB — COMPREHENSIVE METABOLIC PANEL
ALBUMIN: 3.7 g/dL (ref 3.5–5.0)
ALK PHOS: 91 U/L (ref 38–126)
ALT: 15 U/L (ref 14–54)
AST: 19 U/L (ref 15–41)
Anion gap: 14 (ref 5–15)
BILIRUBIN TOTAL: 2 mg/dL — AB (ref 0.3–1.2)
BUN: 13 mg/dL (ref 6–20)
CALCIUM: 8.8 mg/dL — AB (ref 8.9–10.3)
CO2: 23 mmol/L (ref 22–32)
Chloride: 106 mmol/L (ref 101–111)
Creatinine, Ser: 1.56 mg/dL — ABNORMAL HIGH (ref 0.44–1.00)
GFR calc Af Amer: 40 mL/min — ABNORMAL LOW (ref >60)
GFR calc non Af Amer: 35 mL/min — ABNORMAL LOW (ref >60)
GLUCOSE: 96 mg/dL (ref 65–99)
Potassium: 2.6 mmol/L — CL (ref 3.5–5.1)
Sodium: 143 mmol/L (ref 135–145)
TOTAL PROTEIN: 6.4 g/dL — AB (ref 6.5–8.1)

## 2015-05-16 LAB — CBG MONITORING, ED: GLUCOSE-CAPILLARY: 82 mg/dL (ref 65–99)

## 2015-05-16 MED ORDER — PANTOPRAZOLE SODIUM 40 MG PO TBEC
40.0000 mg | DELAYED_RELEASE_TABLET | Freq: Every day | ORAL | Status: DC
  Administered 2015-05-17 – 2015-05-18 (×2): 40 mg via ORAL
  Filled 2015-05-16 (×3): qty 1

## 2015-05-16 MED ORDER — LOSARTAN POTASSIUM 50 MG PO TABS
100.0000 mg | ORAL_TABLET | Freq: Every day | ORAL | Status: DC
  Administered 2015-05-17 – 2015-05-18 (×2): 100 mg via ORAL
  Filled 2015-05-16 (×2): qty 2

## 2015-05-16 MED ORDER — HYDROMORPHONE HCL 1 MG/ML IJ SOLN
0.5000 mg | Freq: Once | INTRAMUSCULAR | Status: AC
  Administered 2015-05-16: 0.5 mg via INTRAVENOUS
  Filled 2015-05-16: qty 1

## 2015-05-16 MED ORDER — SODIUM CHLORIDE 0.9 % IJ SOLN
3.0000 mL | Freq: Two times a day (BID) | INTRAMUSCULAR | Status: DC
  Administered 2015-05-18: 3 mL via INTRAVENOUS

## 2015-05-16 MED ORDER — HYDROCODONE-ACETAMINOPHEN 5-325 MG PO TABS
1.0000 | ORAL_TABLET | ORAL | Status: DC | PRN
  Administered 2015-05-17: 2 via ORAL
  Filled 2015-05-16: qty 2

## 2015-05-16 MED ORDER — SENNOSIDES-DOCUSATE SODIUM 8.6-50 MG PO TABS
1.0000 | ORAL_TABLET | Freq: Every evening | ORAL | Status: DC | PRN
Start: 2015-05-16 — End: 2015-05-18

## 2015-05-16 MED ORDER — ONDANSETRON HCL 4 MG PO TABS: 4.0000 mg | ORAL_TABLET | Freq: Four times a day (QID) | ORAL | Status: DC | PRN

## 2015-05-16 MED ORDER — ENOXAPARIN SODIUM 40 MG/0.4ML ~~LOC~~ SOLN
40.0000 mg | SUBCUTANEOUS | Status: DC
  Administered 2015-05-16 – 2015-05-17 (×2): 40 mg via SUBCUTANEOUS
  Filled 2015-05-16 (×2): qty 0.4

## 2015-05-16 MED ORDER — CLONAZEPAM 0.5 MG PO TABS
0.5000 mg | ORAL_TABLET | Freq: Two times a day (BID) | ORAL | Status: DC
  Administered 2015-05-16: 0.5 mg via ORAL
  Filled 2015-05-16 (×2): qty 1

## 2015-05-16 MED ORDER — ONDANSETRON HCL 4 MG/2ML IJ SOLN: 4.0000 mg | Freq: Four times a day (QID) | INTRAMUSCULAR | Status: DC | PRN

## 2015-05-16 MED ORDER — POTASSIUM CHLORIDE CRYS ER 20 MEQ PO TBCR
40.0000 meq | EXTENDED_RELEASE_TABLET | Freq: Once | ORAL | Status: AC
  Administered 2015-05-16: 40 meq via ORAL

## 2015-05-16 MED ORDER — DOXAZOSIN MESYLATE 8 MG PO TABS
8.0000 mg | ORAL_TABLET | Freq: Every day | ORAL | Status: DC
  Administered 2015-05-16 – 2015-05-17 (×2): 8 mg via ORAL
  Filled 2015-05-16 (×5): qty 1

## 2015-05-16 MED ORDER — DOCUSATE SODIUM 100 MG PO CAPS
100.0000 mg | ORAL_CAPSULE | Freq: Two times a day (BID) | ORAL | Status: DC
  Administered 2015-05-16 – 2015-05-18 (×4): 100 mg via ORAL
  Filled 2015-05-16 (×4): qty 1

## 2015-05-16 MED ORDER — MELOXICAM 7.5 MG PO TABS
15.0000 mg | ORAL_TABLET | Freq: Every day | ORAL | Status: DC
  Administered 2015-05-17 – 2015-05-18 (×2): 15 mg via ORAL
  Filled 2015-05-16 (×2): qty 2

## 2015-05-16 MED ORDER — ONDANSETRON HCL 4 MG/2ML IJ SOLN: 4.0000 mg | Freq: Once | INTRAMUSCULAR | Status: DC

## 2015-05-16 MED ORDER — POTASSIUM CHLORIDE IN NACL 40-0.9 MEQ/L-% IV SOLN
INTRAVENOUS | Status: DC
  Administered 2015-05-16 – 2015-05-18 (×4): 125 mL/h via INTRAVENOUS
  Filled 2015-05-16 (×10): qty 1000

## 2015-05-16 MED ORDER — ASPIRIN EC 325 MG PO TBEC
325.0000 mg | DELAYED_RELEASE_TABLET | Freq: Two times a day (BID) | ORAL | Status: DC
  Administered 2015-05-16 – 2015-05-18 (×4): 325 mg via ORAL
  Filled 2015-05-16 (×4): qty 1

## 2015-05-16 MED ORDER — ALUM & MAG HYDROXIDE-SIMETH 200-200-20 MG/5ML PO SUSP: 30.0000 mL | Freq: Four times a day (QID) | ORAL | Status: DC | PRN

## 2015-05-16 MED ORDER — ACETAMINOPHEN 650 MG RE SUPP
650.0000 mg | Freq: Four times a day (QID) | RECTAL | Status: DC | PRN
Start: 2015-05-16 — End: 2015-05-18

## 2015-05-16 MED ORDER — QUETIAPINE FUMARATE 25 MG PO TABS
50.0000 mg | ORAL_TABLET | Freq: Every day | ORAL | Status: DC
  Administered 2015-05-16: 50 mg via ORAL
  Filled 2015-05-16: qty 2

## 2015-05-16 MED ORDER — TRIAMTERENE-HCTZ 37.5-25 MG PO TABS
1.0000 | ORAL_TABLET | Freq: Every evening | ORAL | Status: DC
  Filled 2015-05-16: qty 1

## 2015-05-16 MED ORDER — CITALOPRAM HYDROBROMIDE 40 MG PO TABS
40.0000 mg | ORAL_TABLET | Freq: Every day | ORAL | Status: DC
  Administered 2015-05-17: 40 mg via ORAL
  Filled 2015-05-16 (×2): qty 1

## 2015-05-16 MED ORDER — CLONIDINE HCL 0.2 MG PO TABS
0.3000 mg | ORAL_TABLET | Freq: Two times a day (BID) | ORAL | Status: DC
  Administered 2015-05-17 – 2015-05-18 (×4): 0.3 mg via ORAL
  Filled 2015-05-16 (×7): qty 1

## 2015-05-16 MED ORDER — METOPROLOL SUCCINATE ER 100 MG PO TB24
100.0000 mg | ORAL_TABLET | Freq: Every day | ORAL | Status: DC
  Administered 2015-05-17 – 2015-05-18 (×2): 100 mg via ORAL
  Filled 2015-05-16 (×2): qty 1

## 2015-05-16 MED ORDER — ACETAMINOPHEN 325 MG PO TABS: 650.0000 mg | ORAL_TABLET | Freq: Four times a day (QID) | ORAL | Status: DC | PRN

## 2015-05-16 NOTE — ED Notes (Signed)
Dr. Littie Deeds now at the bedside.

## 2015-05-16 NOTE — ED Notes (Signed)
Patient attempting to void with EMT assist.  

## 2015-05-16 NOTE — ED Notes (Signed)
Called pharmacist to have potassium pills verified now that brother has arrived with them.

## 2015-05-16 NOTE — ED Notes (Signed)
Potassium pills verified and cleared by pharmacy.

## 2015-05-16 NOTE — ED Notes (Signed)
MD aware of critical potassium levels.

## 2015-05-16 NOTE — ED Notes (Signed)
MD with pt  

## 2015-05-16 NOTE — ED Notes (Signed)
Asked patient to attempt to void, no urge at this time.

## 2015-05-16 NOTE — Progress Notes (Signed)
Pt arrived from ED with VSS and reporting pain bilateral hips.Bailey [name], [place] 05/16/2015 10:37 PM
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
## 2015-05-16 NOTE — ED Notes (Signed)
Sandwich bag given on transfer.

## 2015-05-16 NOTE — ED Notes (Signed)
Admitting MD still at the bedside.
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
## 2015-05-16 NOTE — ED Notes (Signed)
Admitting MD at the bedside.  

## 2015-05-16 NOTE — ED Notes (Signed)
Reported bp 160/110s bp to Dr. Littie Deeds, and current pain being treated with ice packs with no relief for bilateral hips. MD acknowledges.

## 2015-05-16 NOTE — ED Notes (Signed)
Discussed plan of care with patient and family. Discussed request to take potassium tablets from home, explained meds are available here, but home meds must be verified by pharmacist first if patient was to take. Patient declines hospital meds, and currently speaking with brother to obtain home meds from car.

## 2015-05-17 ENCOUNTER — Inpatient Hospital Stay (HOSPITAL_COMMUNITY): Payer: No Typology Code available for payment source

## 2015-05-17 DIAGNOSIS — R55 Syncope and collapse: Secondary | ICD-10-CM

## 2015-05-17 LAB — BASIC METABOLIC PANEL
ANION GAP: 8 (ref 5–15)
BUN: 14 mg/dL (ref 6–20)
CO2: 25 mmol/L (ref 22–32)
Calcium: 8.1 mg/dL — ABNORMAL LOW (ref 8.9–10.3)
Chloride: 112 mmol/L — ABNORMAL HIGH (ref 101–111)
Creatinine, Ser: 1.49 mg/dL — ABNORMAL HIGH (ref 0.44–1.00)
GFR calc Af Amer: 43 mL/min — ABNORMAL LOW (ref >60)
GFR, EST NON AFRICAN AMERICAN: 37 mL/min — AB (ref >60)
GLUCOSE: 97 mg/dL (ref 65–99)
POTASSIUM: 3.1 mmol/L — AB (ref 3.5–5.1)
Sodium: 145 mmol/L (ref 135–145)

## 2015-05-17 LAB — BLOOD GAS, ARTERIAL
ACID-BASE DEFICIT: 1.3 mmol/L (ref 0.0–2.0)
BICARBONATE: 23.1 meq/L (ref 20.0–24.0)
DRAWN BY: 301361
FIO2: 0.21
O2 Saturation: 95.8 %
PATIENT TEMPERATURE: 98.6
PCO2 ART: 40 mmHg (ref 35.0–45.0)
TCO2: 24.3 mmol/L (ref 0–100)
pH, Arterial: 7.379 (ref 7.350–7.450)
pO2, Arterial: 80.1 mmHg (ref 80.0–100.0)

## 2015-05-17 LAB — URINALYSIS, ROUTINE W REFLEX MICROSCOPIC
BILIRUBIN URINE: NEGATIVE
GLUCOSE, UA: NEGATIVE mg/dL
Hgb urine dipstick: NEGATIVE
KETONES UR: NEGATIVE mg/dL
Leukocytes, UA: NEGATIVE
Nitrite: NEGATIVE
PH: 6.5 (ref 5.0–8.0)
Protein, ur: NEGATIVE mg/dL
Urobilinogen, UA: 2 mg/dL — ABNORMAL HIGH (ref 0.0–1.0)

## 2015-05-17 LAB — RAPID URINE DRUG SCREEN, HOSP PERFORMED
Amphetamines: NOT DETECTED
BARBITURATES: NOT DETECTED
Benzodiazepines: NOT DETECTED
Cocaine: POSITIVE — AB
Opiates: POSITIVE — AB
TETRAHYDROCANNABINOL: NOT DETECTED

## 2015-05-17 LAB — GLUCOSE, CAPILLARY
GLUCOSE-CAPILLARY: 101 mg/dL — AB (ref 65–99)
Glucose-Capillary: 101 mg/dL — ABNORMAL HIGH (ref 65–99)

## 2015-05-17 LAB — T4, FREE: FREE T4: 1.13 ng/dL — AB (ref 0.61–1.12)

## 2015-05-17 LAB — TROPONIN I: Troponin I: 0.04 ng/mL — ABNORMAL HIGH (ref <0.031)

## 2015-05-17 LAB — MAGNESIUM: Magnesium: 1.4 mg/dL — ABNORMAL LOW (ref 1.7–2.4)

## 2015-05-17 LAB — TSH: TSH: 0.174 u[IU]/mL — ABNORMAL LOW (ref 0.350–4.500)

## 2015-05-17 MED ORDER — POTASSIUM CHLORIDE CRYS ER 20 MEQ PO TBCR
40.0000 meq | EXTENDED_RELEASE_TABLET | Freq: Three times a day (TID) | ORAL | Status: AC
Start: 2015-05-17 — End: 2015-05-18
  Administered 2015-05-17 (×2): 40 meq via ORAL
  Filled 2015-05-17 (×2): qty 2

## 2015-05-17 MED ORDER — IOHEXOL 350 MG/ML SOLN
100.0000 mL | Freq: Once | INTRAVENOUS | Status: AC | PRN
  Administered 2015-05-17: 100 mL via INTRAVENOUS

## 2015-05-17 MED ORDER — MAGNESIUM SULFATE 2 GM/50ML IV SOLN
2.0000 g | Freq: Once | INTRAVENOUS | Status: AC
  Administered 2015-05-17: 2 g via INTRAVENOUS
  Filled 2015-05-17: qty 50

## 2015-05-17 MED ORDER — CLONAZEPAM 0.5 MG PO TABS
0.2500 mg | ORAL_TABLET | Freq: Two times a day (BID) | ORAL | Status: DC
  Administered 2015-05-17 – 2015-05-18 (×2): 0.25 mg via ORAL
  Filled 2015-05-17 (×2): qty 1

## 2015-05-17 MED ORDER — QUETIAPINE FUMARATE 25 MG PO TABS
25.0000 mg | ORAL_TABLET | Freq: Every day | ORAL | Status: DC
  Administered 2015-05-17: 25 mg via ORAL
  Filled 2015-05-17: qty 1

## 2015-05-17 NOTE — Progress Notes (Signed)
EEG completed, results pending. 
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
## 2015-05-17 NOTE — Procedures (Signed)
ELECTROENCEPHALOGRAM REPORT   Patient: Bailey [name]      Room #: 1O-10 Age: 59 y.o.        Sex: female Referring Physician: Dr Susie Cassette Report Date:  05/17/2015        Interpreting Physician: Omelia Blackwater  History: Bailey [name] is an 59 y.o. female admitted with seizure vs syncope  Medications:  Scheduled: . aspirin EC  325 mg Oral BID  . clonazePAM  0.25 mg Oral BID  . cloNIDine  0.3 mg Oral BID  . docusate sodium  100 mg Oral BID  . doxazosin  8 mg Oral QHS  . enoxaparin (LOVENOX) injection  40 mg Subcutaneous Q24H  . losartan  100 mg Oral Daily  . meloxicam  15 mg Oral Q breakfast  . metoprolol succinate  100 mg Oral Daily  . ondansetron (ZOFRAN) IV  4 mg Intravenous Once  . pantoprazole  40 mg Oral Daily  . potassium chloride SA  40 mEq Oral TID  . QUEtiapine  25 mg Oral QHS  . sodium chloride  3 mL Intravenous Q12H    Conditions of Recording:  This is a 19 channel EEG carried out with the patient in the asleep state.  Description:  The waking background activity consists of a low to medium voltage, symmetrical, poorly organized, mix of theta and delta activity, seen from the parieto-occipital and posterior temporal regions. No focal slowing or epileptiform activity is noted. The patient drowses with slowing to irregular, low voltage theta and beta activity.    The patient goes in to a light sleep with symmetrical sleep spindles, vertex central sharp transients and irregular slow activity. Stage II sleep is not obtained.  Hyperventilation was not performed. Intermittent photic stimulation was not performed.  IMPRESSION: Normal electroencephalogram, awake, asleep and with activation procedures. There are no focal lateralizing or epileptiform features.   Elspeth Cho, DO [facility] [phone]  If 7pm- 7am, please page neurology on call as listed in [facility]ION. 05/17/2015, 5:28 PM

## 2015-05-17 NOTE — Progress Notes (Signed)
  Echocardiogram 2D Echocardiogram has been performed.  Ollis Daudelin 05/17/2015, 9:02 AM

## 2015-05-18 DIAGNOSIS — F1910 Other psychoactive substance abuse, uncomplicated: Secondary | ICD-10-CM

## 2015-05-18 LAB — BASIC METABOLIC PANEL
Anion gap: 3 — ABNORMAL LOW (ref 5–15)
BUN: 14 mg/dL (ref 6–20)
CO2: 22 mmol/L (ref 22–32)
Calcium: 8.5 mg/dL — ABNORMAL LOW (ref 8.9–10.3)
Chloride: 121 mmol/L — ABNORMAL HIGH (ref 101–111)
Creatinine, Ser: 1.28 mg/dL — ABNORMAL HIGH (ref 0.44–1.00)
GFR calc Af Amer: 51 mL/min — ABNORMAL LOW (ref >60)
GFR, EST NON AFRICAN AMERICAN: 44 mL/min — AB (ref >60)
GLUCOSE: 99 mg/dL (ref 65–99)
POTASSIUM: 5 mmol/L (ref 3.5–5.1)
Sodium: 146 mmol/L — ABNORMAL HIGH (ref 135–145)

## 2015-05-18 LAB — GLUCOSE, CAPILLARY: GLUCOSE-CAPILLARY: 90 mg/dL (ref 65–99)

## 2015-05-18 MED ORDER — ACETAMINOPHEN 325 MG PO TABS: 650.0000 mg | ORAL_TABLET | Freq: Four times a day (QID) | ORAL | Status: AC | PRN

## 2015-05-18 NOTE — Care Management Note (Signed)
Case Management Note  Patient Details  Name: Bailey [name] MRN: [id number] Date of Birth: 02/17/1956  Subjective/Objective:     Patient is for dc today, no needs.               Action/Plan:   Expected Discharge Date:                  Expected Discharge Plan:  Home/Self Care  In-House Referral:     Discharge planning Services  CM Consult  Post Acute Care Choice:    Choice offered to:     DME Arranged:    DME Agency:     HH Arranged:    HH Agency:     Status of Service:  Completed, signed off  Medicare Important Message Given:    Date Medicare IM Given:    Medicare IM give by:    Date Additional Medicare IM Given:    Additional Medicare Important Message give by:     If discussed at Long Length of Stay Meetings, dates discussed:    Additional Comments:  Leone Haven, RN 05/18/2015, 12:32 PM
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
## 2015-05-18 NOTE — Progress Notes (Signed)
05/18/15 Patient being discharged home today, IV site removed and discharge instructions reviewed with patient.

## 2015-05-23 LAB — ALDOSTERONE: Aldosterone: 40.6 ng/dL — ABNORMAL HIGH (ref 0.0–30.0)

## 2015-05-25 ENCOUNTER — Other Ambulatory Visit: Payer: Self-pay | Admitting: Internal Medicine

## 2015-05-25 DIAGNOSIS — E041 Nontoxic single thyroid nodule: Secondary | ICD-10-CM

## 2015-05-29 ENCOUNTER — Ambulatory Visit
Admission: RE | Admit: 2015-05-29 | Discharge: 2015-05-29 | Disposition: A | Discharge status: Home / Self Care | Payer: Medicare Other | Source: Ambulatory Visit | Attending: Internal Medicine | Admitting: Internal Medicine

## 2015-05-29 ENCOUNTER — Other Ambulatory Visit: Payer: Self-pay | Admitting: Internal Medicine

## 2015-05-29 DIAGNOSIS — E041 Nontoxic single thyroid nodule: Secondary | ICD-10-CM

## 2015-05-30 ENCOUNTER — Ambulatory Visit: Payer: Medicare Other | Attending: Orthopedic Surgery

## 2015-05-30 DIAGNOSIS — R29818 Other symptoms and signs involving the nervous system: Secondary | ICD-10-CM | POA: Insufficient documentation

## 2015-05-30 DIAGNOSIS — R2689 Other abnormalities of gait and mobility: Secondary | ICD-10-CM

## 2015-05-30 DIAGNOSIS — R29898 Other symptoms and signs involving the musculoskeletal system: Secondary | ICD-10-CM | POA: Diagnosis not present

## 2015-05-30 DIAGNOSIS — R262 Difficulty in walking, not elsewhere classified: Secondary | ICD-10-CM | POA: Diagnosis present
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
## 2015-06-01 ENCOUNTER — Ambulatory Visit: Payer: Medicare Other | Attending: Orthopedic Surgery | Admitting: Physical Therapy

## 2015-06-01 DIAGNOSIS — R29818 Other symptoms and signs involving the nervous system: Secondary | ICD-10-CM | POA: Diagnosis present

## 2015-06-01 DIAGNOSIS — R2689 Other abnormalities of gait and mobility: Secondary | ICD-10-CM

## 2015-06-01 DIAGNOSIS — R262 Difficulty in walking, not elsewhere classified: Secondary | ICD-10-CM | POA: Insufficient documentation

## 2015-06-01 DIAGNOSIS — R29898 Other symptoms and signs involving the musculoskeletal system: Secondary | ICD-10-CM
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
## 2015-06-06 ENCOUNTER — Ambulatory Visit: Payer: Medicare Other

## 2015-06-06 DIAGNOSIS — R29898 Other symptoms and signs involving the musculoskeletal system: Secondary | ICD-10-CM | POA: Diagnosis not present

## 2015-06-06 DIAGNOSIS — R2689 Other abnormalities of gait and mobility: Secondary | ICD-10-CM

## 2015-06-06 DIAGNOSIS — R262 Difficulty in walking, not elsewhere classified: Secondary | ICD-10-CM
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
## 2015-06-06 NOTE — Patient Instructions (Signed)
Adductor Stretch, Standing Groin Stretch   Hands on hips, back straight, feet parallel and 4"-6" greater than shoulder width apart. Slowly shift weight to one side, keeping bent knee directly over foot. Hold _5 -15 sec___ seconds. Repeat to other side.  10-15 reps  Copyright  VHI. All rights reserved.

## 2015-06-08 ENCOUNTER — Ambulatory Visit: Payer: Medicare Other | Admitting: Physical Therapy

## 2015-06-08 ENCOUNTER — Other Ambulatory Visit (HOSPITAL_COMMUNITY)
Admission: RE | Admit: 2015-06-08 | Discharge: 2015-06-08 | Disposition: A | Discharge status: Home / Self Care | Payer: Medicare Other | Source: Ambulatory Visit | Attending: Interventional Radiology | Admitting: Interventional Radiology

## 2015-06-08 ENCOUNTER — Ambulatory Visit
Admission: RE | Admit: 2015-06-08 | Discharge: 2015-06-08 | Disposition: A | Discharge status: Home / Self Care | Payer: Medicare Other | Source: Ambulatory Visit | Attending: Internal Medicine | Admitting: Internal Medicine

## 2015-06-08 VITALS — BP 160/95

## 2015-06-08 DIAGNOSIS — R2689 Other abnormalities of gait and mobility: Secondary | ICD-10-CM

## 2015-06-08 DIAGNOSIS — E041 Nontoxic single thyroid nodule: Secondary | ICD-10-CM | POA: Diagnosis present

## 2015-06-08 DIAGNOSIS — R29898 Other symptoms and signs involving the musculoskeletal system: Secondary | ICD-10-CM | POA: Diagnosis not present

## 2015-06-08 DIAGNOSIS — R262 Difficulty in walking, not elsewhere classified: Secondary | ICD-10-CM

## 2015-06-08 NOTE — Therapy (Signed)
[facility] [address] [place], [place], [address] Phone: [phone]   Fax:  [phone]  Physical Therapy Treatment  Patient Details  Name: Bailey [name] MRN: [id number] Date of Birth: 04-30-1956 Referring Provider:  Georgann Housekeeper, MD  Encounter Date: 06/08/2015      PT End of Session - 06/08/15 1545    Visit Number 4   Number of Visits 20   Date for PT Re-Evaluation 08/08/15   Authorization Type Medicare    KX at 12   PT Start Time 1400   PT Stop Time 1440   PT Time Calculation (min) 40 min   Activity Tolerance Patient tolerated treatment well      Past Medical History  Diagnosis Date  . Hypertension     all BP meds. managed by Dr. Eula Listen    . Stroke     2005, R sided weakness   . Pneumonia     while at [facility].- 1990's told that she had pneumonia & was isolated   . Arthritis     OA- R knee, L hip   . Anxiety     "Breakdown" in the 1990's, hosp. for >1 wk. at [facility]    . Incontinence of urine     weak bladder  . Hypertension   . Stroke 2005    ? weakness in hand and arms per mother. patient unsure  . Asthma     years ago. "no problems in a long while."  . Anxiety   . Depression   . GERD (gastroesophageal reflux disease)   . Constipation   . Goiter     removed  at age 64    Past Surgical History  Procedure Laterality Date  . Thyroidectomy, partial    . Tubal ligation    . Dilation and curettage of uterus      1980's  . Total knee arthroplasty  10/21/2011    Procedure: TOTAL KNEE ARTHROPLASTY;  Surgeon: Nestor Lewandowsky, MD;  Location: [facility] OR;  Service: Orthopedics;  Laterality: Right;  DEPUY SIGMA RP  . Joint replacement  10/2011    Rt knee  . Total hip arthroplasty  12/23/2011    Procedure: TOTAL HIP ARTHROPLASTY;  Surgeon: Nestor Lewandowsky, MD;  Location: [facility] OR;  Service: Orthopedics;  Laterality: Left;  DEPUY/PINNACLE CUPS/SROM STEM  . Colonoscopy    . Joint replacement Right     knee  .  Total hip arthroplasty Left   . Abdominal hysterectomy    . Tonsillectomy    . Goiter      removed age 64  . Total hip arthroplasty Right 02/15/2015    Procedure: TOTAL HIP ARTHROPLASTY;  Surgeon: Gean Birchwood, MD;  Location: [facility] OR;  Service: Orthopedics;  Laterality: Right;    Filed Vitals:   06/08/15 1410  BP: 160/95    Visit Diagnosis:  Weakness of lower extremity, unspecified laterality  Weakness of right hip  Difficulty walking  Balance problems      Subjective Assessment - 06/08/15 1410    Subjective Really likes the Nu-Step and states, "oh that feels good!"   States she woke up with left groin pain this AM.  Right THR is the most recent surgery.  Usually just tired after therapy but not especially painful.     Currently in Pain? Yes   Pain Score 3    Pain Location Hip   Pain Orientation Left   Pain Type Surgical pain   Pain Onset More  than a month ago   Pain Frequency Intermittent   Aggravating Factors  lifting left leg to put leg in car and getting into bed uses hands to assist                         [facility] Adult PT Treatment/Exercise - 06/08/15 1415    Knee/Hip Exercises: Aerobic   Nustep 10 min L5 LEs only   Knee/Hip Exercises: Standing   Hip Abduction Stengthening;Right;Left;15 reps;AROM;10 reps   Abduction Limitations signiff UE support   Hip Extension AROM;Right;Left;1 set;10 reps   Extension Limitations B UE support   Knee/Hip Exercises: Seated   Long Arc Quad Strengthening;Right;Left;Weights;20 reps   Long Arc Quad Weight 3 lbs.   Heel Slides Strengthening;Right;Left;20 reps  3#   Clamshell with TheraBand Red  20X B   Sit to Sand 1 set;10 reps;without UE support  high table                  PT Short Term Goals - 06/08/15 1551    PT SHORT TERM GOAL #1   Title She will be independent with all inital HEp   Time 3   Period Weeks   Status On-going   PT SHORT TERM GOAL #2   Title She will be able to stand from mat  without hands x 10 reps.    Time 5   Period Weeks   Status On-going   PT SHORT TERM GOAL #3   Title She will walk with CGA with SPC 200 feet or more   Time 5   Period Weeks   Status On-going   PT SHORT TERM GOAL #4   Title She will demo 3/5 RT hip strength abduct/extesnion for improved stability with walking   Time 5   Period Weeks   Status On-going           PT Long Term Goals - 06/08/15 1551    PT LONG TERM GOAL #1   Title she will have 4/5 RT hip strengh or better to allow fotr walking with SPC in home safely   Time 10   Period Weeks   Status On-going   PT LONG TERM GOAL #2   Title She will be independent with all HEP issued as of last visit   Time 10   Period Weeks   Status On-going   PT LONG TERM GOAL #3   Title She will be able to lift legs on /off mat without assitance.    Time 10   Period Weeks   Status On-going   PT LONG TERM GOAL #4   Title She will be able to stand one leg RT 5 sec and LT 8-10 sec to demo improved stability on one leg for improved stability with walking   Time 10   Period Weeks   Status On-going   PT LONG TERM GOAL #5   Title She will be able to walk up and down stairs with one rail step over step safely to demo improved gait and balance/strength   Time 10   Period Weeks   Status On-going               Plan - 06/08/15 1546    Clinical Impression Statement Primary complaint is left groin pain in hip flexor region origin with getting in/out of the car and bed.  (right THR in May).  Moderate genu valgus on left with severe ankle pronation with weight bearing.  The patient does well with non-weight bearing exercises but fatigues quickly with weight bearing ex.  Towards end of treatment session, patient asks to have her BP taken:  160/95 (after ex).  Patient states this is "good" for her.  Therapist closely monitoring her response with all and adherence to hip precautions.     PT Next Visit Plan continue with Bilateral LE strengthening;  Nu-Step; moist heat as needed for pain control        Problem List Patient Active Problem List   Diagnosis Date Noted  . Syncopal episodes 05/16/2015  . Acute renal injury 05/16/2015  . Elevated troponin 05/16/2015  . Hypertension 05/16/2015  . Non compliance w medication regimen 05/16/2015  . Pulmonary nodule 05/16/2015  . Pressure ulcer 02/28/2015  . Dehydration 02/26/2015  . Intractable vomiting with nausea 02/26/2015  . Hypokalemia 02/25/2015  . Abdominal pain   . Primary osteoarthritis of right hip 02/15/2015  . Severe recurrent major depressive disorder with psychotic features 09/01/2014  . GAD (generalized anxiety disorder) 09/01/2014  . PTSD (post-traumatic stress disorder) 09/01/2014  . Hip arthritis left 12/23/2011  . 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

## 2015-06-12 ENCOUNTER — Ambulatory Visit: Payer: Medicare Other | Admitting: Physical Therapy

## 2015-06-12 DIAGNOSIS — R29898 Other symptoms and signs involving the musculoskeletal system: Secondary | ICD-10-CM

## 2015-06-12 DIAGNOSIS — R2689 Other abnormalities of gait and mobility: Secondary | ICD-10-CM

## 2015-06-12 DIAGNOSIS — R262 Difficulty in walking, not elsewhere classified: Secondary | ICD-10-CM

## 2015-06-12 NOTE — Patient Instructions (Signed)
Use her walker for walking.  Not yet ready for cane.

## 2015-06-12 NOTE — Therapy (Signed)
[facility] Outpatient Rehabilitation [facility] [address] [place], [place], [address] Phone: [phone]   Fax:  [phone]  Physical Therapy Treatment  Patient Details  Name: Bailey [name] MRN: [id number] Date of Birth: 1955/10/31 Referring Provider:  Georgann Housekeeper, MD  Encounter Date: 06/12/2015      PT End of Session - 06/12/15 1614    Visit Number 5   Number of Visits 20   Date for PT Re-Evaluation 08/08/15   PT Start Time 1420   PT Stop Time 1500   PT Time Calculation (min) 40 min   Equipment Utilized During Treatment Gait belt   Activity Tolerance Patient tolerated treatment well;No increased pain   Behavior During Therapy [facility] for tasks assessed/performed      Past Medical History  Diagnosis Date  . Hypertension     all BP meds. managed by Dr. Eula Listen    . Stroke     2005, R sided weakness   . Pneumonia     while at [facility].- 1990's told that she had pneumonia & was isolated   . Arthritis     OA- R knee, L hip   . Anxiety     "Breakdown" in the 1990's, hosp. for >1 wk. at [facility]    . Incontinence of urine     weak bladder  . Hypertension   . Stroke 2005    ? weakness in hand and arms per mother. patient unsure  . Asthma     years ago. "no problems in a long while."  . Anxiety   . Depression   . GERD (gastroesophageal reflux disease)   . Constipation   . Goiter     removed  at age 46    Past Surgical History  Procedure Laterality Date  . Thyroidectomy, partial    . Tubal ligation    . Dilation and curettage of uterus      1980's  . Total knee arthroplasty  10/21/2011    Procedure: TOTAL KNEE ARTHROPLASTY;  Surgeon: Nestor Lewandowsky, MD;  [facility]: [facility];  Service: Orthopedics;  Laterality: Right;  DEPUY SIGMA RP  . Joint replacement  10/2011    Rt knee  . Total hip arthroplasty  12/23/2011    Procedure: TOTAL HIP ARTHROPLASTY;  Surgeon: Nestor Lewandowsky, MD;  Location: [facility] OR;  Service: Orthopedics;  Laterality: Left;   DEPUY/PINNACLE CUPS/SROM STEM  . Colonoscopy    . Joint replacement Right     knee  . Total hip arthroplasty Left   . Abdominal hysterectomy    . Tonsillectomy    . Goiter      removed age 59  . Total hip arthroplasty Right 02/15/2015    Procedure: TOTAL HIP ARTHROPLASTY;  Surgeon: Gean Birchwood, MD;  [facility] OR;  Service: Orthopedics;  Laterality: Right;    There were no vitals filed for this visit.  Visit Diagnosis:  Weakness of lower extremity, unspecified laterality  Weakness of right hip  Difficulty walking  Balance problems      Subjective Assessment - 06/12/15 1430    Subjective Noticed she wants to walk more .  less limping.   Walked in/out with single point cane.  Used her cane All day Sat and Sunday.  Groin pain    Currently in Pain? Yes   Pain Score 2    Pain Location Groin   Pain Orientation Left   Pain Descriptors / Indicators Stabbing   Pain Frequency Intermittent   Aggravating Factors  Lifting leg   Pain Relieving Factors rest                         [facility] Adult PT Treatment/Exercise - 06/12/15 1420    Ambulation/Gait   Pre-Gait Activities weight shifting and other pre gait activities   Gait Comments other pre gait.  Unable to single leg stand without hands.   Knee/Hip Exercises: Standing   Heel Raises 10 reps   Forward Step Up 5 reps;Hand Hold: 2;Step Height: 6"   Forward Step Up Limitations --  heavy use of hands   Knee/Hip Exercises: Seated   Ball Squeeze 20  pillow   Other Seated Knee/Hip Exercises hip walking to Left, cues for breathing, using her core.   Sit to Sand 1 set;10 reps;without UE support  high table, cues to keep knees apart   Knee/Hip Exercises: Supine   Bridges 10 reps   Other Supine Knee/Hip Exercises bent knee lift with cues to breath in to prepare, breat out to lift.   Knee/Hip Exercises: Sidelying   Hip ABduction AAROM;Strengthening;1 set   Hip ABduction Limitations 2+/5 MMTLT   Clams 10 reps 2 sets   cues.                PT Education - 06/12/15 1613    Education provided Yes   Education Details use walker   Person(s) Educated Patient   Methods Explanation   Comprehension Verbalized understanding          PT Short Term Goals - 06/12/15 1617    PT SHORT TERM GOAL #1   Title She will be independent with all inital HEp   Time 3   Period Weeks   Status On-going   PT SHORT TERM GOAL #2   Title She will be able to stand from mat without hands x 10 reps.    Baseline high mat 5 X no hands   Time 5   Period Weeks   Status On-going   PT SHORT TERM GOAL #3   Title She will walk with CGA with SPC 200 feet or more   Baseline 20 feet in clinic, thought out cane was too heavy.   Time 5   Period Weeks   Status On-going   PT SHORT TERM GOAL #4   Title She will demo 3/5 RT hip strength abduct/extesnion for improved stability with walking   Time 5   Period Weeks   Status On-going           PT Long Term Goals - 06/08/15 1551    PT LONG TERM GOAL #1   Title she will have 4/5 RT hip strengh or better to allow fotr walking with SPC in home safely   Time 10   Period Weeks   Status On-going   PT LONG TERM GOAL #2   Title She will be independent with all HEP issued as of last visit   Time 10   Period Weeks   Status On-going   PT LONG TERM GOAL #3   Title She will be able to lift legs on /off mat without assitance.    Time 10   Period Weeks   Status On-going   PT LONG TERM GOAL #4   Title She will be able to stand one leg RT 5 sec and LT 8-10 sec to demo improved stability on one leg for improved stability with walking   Time 10   Period Weeks  Status On-going   PT LONG TERM GOAL #5   Title She will be able to walk up and down stairs with one rail step over step safely to demo improved gait and balance/strength   Time 10   Period Weeks   Status On-going               Plan - 06/12/15 1615    Clinical Impression Statement Patient has been using cane  at home so I advised her to use walker for safety.  Strengthening,   2+/5 hip LT.  Bed mobility and transitions take less time.   PT Next Visit Plan continue with Bilateral LE strengthening; Nu-Step; moist heat as needed for pain control   PT Home Exercise Plan use walker   Consulted and Agree with Plan of Care Patient        Problem List Patient Active Problem List   Diagnosis Date Noted  . Syncopal episodes 05/16/2015  . Acute renal injury 05/16/2015  . Elevated troponin 05/16/2015  . Hypertension 05/16/2015  . Non compliance w medication regimen 05/16/2015  . Pulmonary nodule 05/16/2015  . Pressure ulcer 02/28/2015  . Dehydration 02/26/2015  . Intractable vomiting with nausea 02/26/2015  . Hypokalemia 02/25/2015  . Abdominal pain   . Primary osteoarthritis of right hip 02/15/2015  . Severe recurrent major depressive disorder with psychotic features 09/01/2014  . GAD (generalized anxiety disorder) 09/01/2014  . PTSD (post-traumatic stress disorder) 09/01/2014  . Hip arthritis left 12/23/2011  . 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

## 2015-06-13 ENCOUNTER — Ambulatory Visit: Payer: Medicare Other

## 2015-06-13 DIAGNOSIS — R29898 Other symptoms and signs involving the musculoskeletal system: Secondary | ICD-10-CM

## 2015-06-13 DIAGNOSIS — R262 Difficulty in walking, not elsewhere classified: Secondary | ICD-10-CM

## 2015-06-13 DIAGNOSIS — R2689 Other abnormalities of gait and mobility: Secondary | ICD-10-CM

## 2015-06-13 NOTE — Therapy (Signed)
[facility] Outpatient Rehabilitation [facility] [address] [place], [place], [address] Phone: [phone]   Fax:  [phone]  Physical Therapy Treatment  Patient Details  Name: Bailey [name] MRN: [id number] Date of Birth: 18-Apr-1956 Referring Provider:  Georgann Housekeeper, MD  Encounter Date: 06/13/2015      PT End of Session - 06/13/15 1544    Visit Number 6   Number of Visits 20   Date for PT Re-Evaluation 08/08/15   PT Start Time 0305   PT Stop Time 0345   PT Time Calculation (min) 40 min   Activity Tolerance Patient tolerated treatment well   Behavior During Therapy [facility] for tasks assessed/performed      Past Medical History  Diagnosis Date  . Hypertension     all BP meds. managed by Dr. Eula Listen    . Stroke     2005, R sided weakness   . Pneumonia     while at [facility].- 1990's told that she had pneumonia & was isolated   . Arthritis     OA- R knee, L hip   . Anxiety     "Breakdown" in the 1990's, hosp. for >1 wk. at [facility]    . Incontinence of urine     weak bladder  . Hypertension   . Stroke 2005    ? weakness in hand and arms per mother. patient unsure  . Asthma     years ago. "no problems in a long while."  . Anxiety   . Depression   . GERD (gastroesophageal reflux disease)   . Constipation   . Goiter     removed  at age 27    Past Surgical History  Procedure Laterality Date  . Thyroidectomy, partial    . Tubal ligation    . Dilation and curettage of uterus      1980's  . Total knee arthroplasty  10/21/2011    Procedure: TOTAL KNEE ARTHROPLASTY;  Surgeon: Nestor Lewandowsky, MD;  [facility]: [facility];  Service: Orthopedics;  Laterality: Right;  DEPUY SIGMA RP  . Joint replacement  10/2011    Rt knee  . Total hip arthroplasty  12/23/2011    Procedure: TOTAL HIP ARTHROPLASTY;  Surgeon: Nestor Lewandowsky, MD;  Location: [facility] OR;  Service: Orthopedics;  Laterality: Left;  DEPUY/PINNACLE CUPS/SROM STEM  . Colonoscopy    . Joint replacement Right      knee  . Total hip arthroplasty Left   . Abdominal hysterectomy    . Tonsillectomy    . Goiter      removed age 70  . Total hip arthroplasty Right 02/15/2015    Procedure: TOTAL HIP ARTHROPLASTY;  Surgeon: Gean Birchwood, MD;  Location: [facility] OR;  Service: Orthopedics;  Laterality: Right;    There were no vitals filed for this visit.  Visit Diagnosis:  Weakness of lower extremity, unspecified laterality  Weakness of right hip  Difficulty walking  Balance problems      Subjective Assessment - 06/12/15 1430    Subjective Noticed she wants to walk more .  less limping.   Walked in/out with single point cane.  Used her cane All day Sat and Sunday.  Groin pain    Currently in Pain? Yes   Pain Score 2    Pain Location Groin   Pain Orientation Left   Pain Descriptors / Indicators Stabbing   Pain Frequency Intermittent   Aggravating Factors  Lifting leg   Pain Relieving Factors rest  [facility] Adult PT Treatment/Exercise - 06/13/15 0001    Knee/Hip Exercises: Aerobic   Nustep 6 min L5 LE only   Knee/Hip Exercises: Standing   Functional Squat 1 set;15 reps   Functional Squat Limitations From mat and sitting on firm knee bolster without use of hands. Very difficult even from this height..    Knee/Hip Exercises: Seated   Long Arc Quad Strengthening;Right;Left;Weights;20 reps  10 sec holds   Long Arc Quad Weight 4 lbs.   Knee/Hip Exercises: Supine   Financial planner;Both  30 reps   Bridges with [employer] Both;10 reps   Bridges with Clamshell 10 reps;Both;Strengthening   Other Supine Knee/Hip Exercises bent knee lift with cues to breath in to prepare, breat out to lift.                PT Education - 06/12/15 1613    Education provided Yes   Education Details use walker   Person(s) Educated Patient   Methods Explanation   Comprehension Verbalized understanding          PT Short Term Goals - 06/12/15 1617    PT SHORT  TERM GOAL #1   Title She will be independent with all inital HEp   Time 3   Period Weeks   Status On-going   PT SHORT TERM GOAL #2   Title She will be able to stand from mat without hands x 10 reps.    Baseline high mat 5 X no hands   Time 5   Period Weeks   Status On-going   PT SHORT TERM GOAL #3   Title She will walk with CGA with SPC 200 feet or more   Baseline 20 feet in clinic, thought out cane was too heavy.   Time 5   Period Weeks   Status On-going   PT SHORT TERM GOAL #4   Title She will demo 3/5 RT hip strength abduct/extesnion for improved stability with walking   Time 5   Period Weeks   Status On-going           PT Long Term Goals - 06/08/15 1551    PT LONG TERM GOAL #1   Title she will have 4/5 RT hip strengh or better to allow fotr walking with SPC in home safely   Time 10   Period Weeks   Status On-going   PT LONG TERM GOAL #2   Title She will be independent with all HEP issued as of last visit   Time 10   Period Weeks   Status On-going   PT LONG TERM GOAL #3   Title She will be able to lift legs on /off mat without assitance.    Time 10   Period Weeks   Status On-going   PT LONG TERM GOAL #4   Title She will be able to stand one leg RT 5 sec and LT 8-10 sec to demo improved stability on one leg for improved stability with walking   Time 10   Period Weeks   Status On-going   PT LONG TERM GOAL #5   Title She will be able to walk up and down stairs with one rail step over step safely to demo improved gait and balance/strength   Time 10   Period Weeks   Status On-going               Plan - 06/13/15 1544    Clinical Impression Statement She was able to do the program  with good effort and no pain. She is weak that when use of body weight she is not able to do well and need UE support/   Degenerative changes in LT knee impact LE function. She had questions about use of ball at home for exerdise and I advised to not use until we assess here as  to safety with use.   PT Next Visit Plan continue with Bilateral LE strengthening; Nu-Step; moist heat as needed for pain control   Consulted and Agree with Plan of Care Patient        Problem List Patient Active Problem List   Diagnosis Date Noted  . Syncopal episodes 05/16/2015  . Acute renal injury 05/16/2015  . Elevated troponin 05/16/2015  . Hypertension 05/16/2015  . Non compliance w medication regimen 05/16/2015  . Pulmonary nodule 05/16/2015  . Pressure ulcer 02/28/2015  . Dehydration 02/26/2015  . Intractable vomiting with nausea 02/26/2015  . Hypokalemia 02/25/2015  . Abdominal pain   . Primary osteoarthritis of right hip 02/15/2015  . Severe recurrent major depressive disorder with psychotic features 09/01/2014  . GAD (generalized anxiety disorder) 09/01/2014  . PTSD (post-traumatic stress disorder) 09/01/2014  . Hip arthritis left 12/23/2011  . 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

## 2015-06-19 ENCOUNTER — Ambulatory Visit: Payer: Medicare Other

## 2015-06-19 DIAGNOSIS — R262 Difficulty in walking, not elsewhere classified: Secondary | ICD-10-CM

## 2015-06-19 DIAGNOSIS — R2689 Other abnormalities of gait and mobility: Secondary | ICD-10-CM

## 2015-06-19 DIAGNOSIS — R29898 Other symptoms and signs involving the musculoskeletal system: Secondary | ICD-10-CM | POA: Diagnosis not present

## 2015-06-19 NOTE — Therapy (Signed)
[facility] [address] [place], [place], [address] Phone: [phone]   Fax:  [phone]  Physical Therapy Treatment  Patient Details  Name: Bailey [name] MRN: [id number] Date of Birth: Feb 22, 1956 Referring Provider:  Georgann Housekeeper, MD  Encounter Date: 06/19/2015      PT End of Session - 06/19/15 1417    Visit Number 7   Number of Visits 20   Date for PT Re-Evaluation 08/08/15   PT Start Time 0215   PT Stop Time 0300   PT Time Calculation (min) 45 min   Activity Tolerance Patient tolerated treatment well   Behavior During Therapy [facility] for tasks assessed/performed      Past Medical History  Diagnosis Date  . Hypertension     all BP meds. managed by Dr. Eula Listen    . Stroke     2005, R sided weakness   . Pneumonia     while at [facility].- 1990's told that she had pneumonia & was isolated   . Arthritis     OA- R knee, L hip   . Anxiety     "Breakdown" in the 1990's, hosp. for >1 wk. at [facility]    . Incontinence of urine     weak bladder  . Hypertension   . Stroke 2005    ? weakness in hand and arms per mother. patient unsure  . Asthma     years ago. "no problems in a long while."  . Anxiety   . Depression   . GERD (gastroesophageal reflux disease)   . Constipation   . Goiter     removed  at age 59    Past Surgical History  Procedure Laterality Date  . Thyroidectomy, partial    . Tubal ligation    . Dilation and curettage of uterus      1980's  . Total knee arthroplasty  10/21/2011    Procedure: TOTAL KNEE ARTHROPLASTY;  Surgeon: Nestor Lewandowsky, MD;  Location: [facility] OR;  Service: Orthopedics;  Laterality: Right;  DEPUY SIGMA RP  . Joint replacement  10/2011    Rt knee  . Total hip arthroplasty  12/23/2011    Procedure: TOTAL HIP ARTHROPLASTY;  Surgeon: Nestor Lewandowsky, MD;  Location: [facility] OR;  Service: Orthopedics;  Laterality: Left;  DEPUY/PINNACLE CUPS/SROM STEM  . Colonoscopy    . Joint replacement Right      knee  . Total hip arthroplasty Left   . Abdominal hysterectomy    . Tonsillectomy    . Goiter      removed age 59  . Total hip arthroplasty Right 02/15/2015    Procedure: TOTAL HIP ARTHROPLASTY;  Surgeon: Gean Birchwood, MD;  Location: [facility] OR;  Service: Orthopedics;  Laterality: Right;    There were no vitals filed for this visit.  Visit Diagnosis:  Weakness of lower extremity, unspecified laterality  Weakness of right hip  Difficulty walking  Balance problems                       [facility] Adult PT Treatment/Exercise - 06/19/15 1419    Knee/Hip Exercises: Aerobic   Nustep 6 min L6 LE only   Knee/Hip Exercises: Standing   Heel Raises 15 reps   Functional Squat 1 set;15 reps   Functional Squat Limitations From mat and sitting on firm knee bolster without use of hands. Very difficult even from this height..    Knee/Hip Exercises: Seated   Long Arc  Quad Strengthening;Right;Left;Weights;20 reps   Long [employer] Weight 3 lbs.   Ball Squeeze 20   Knee/Hip Exercises: Supine   Straight Leg Raises Strengthening;Right;Left;10 reps   Straight Leg Raises Limitations legs on red ball   Other Supine Knee/Hip Exercises red ball under knees LTR x 15 rT and LT. , LE flexion and extension  x15, bridge x 15     Other Supine Knee/Hip Exercises ball squeeze x15 3-5 sec hold   Knee/Hip Exercises: Sidelying   Hip ABduction AAROM;Strengthening;1 set;15 reps   Clams 10 reps 2 sets  asssisted for final 8 reps to get most range   Other Sidelying Knee/Hip Exercises resisted pelvic rolling LT and RT  x 12 each way RT and LT sidelye                  PT Short Term Goals - 06/12/15 1617    PT SHORT TERM GOAL #1   Title She will be independent with all inital HEp   Time 3   Period Weeks   Status On-going   PT SHORT TERM GOAL #2   Title She will be able to stand from mat without hands x 10 reps.    Baseline high mat 5 X no hands   Time 5   Period Weeks   Status On-going    PT SHORT TERM GOAL #3   Title She will walk with CGA with SPC 200 feet or more   Baseline 20 feet in clinic, thought out cane was too heavy.   Time 5   Period Weeks   Status On-going   PT SHORT TERM GOAL #4   Title She will demo 3/5 RT hip strength abduct/extesnion for improved stability with walking   Time 5   Period Weeks   Status On-going           PT Long Term Goals - 06/08/15 1551    PT LONG TERM GOAL #1   Title she will have 4/5 RT hip strengh or better to allow fotr walking with SPC in home safely   Time 10   Period Weeks   Status On-going   PT LONG TERM GOAL #2   Title She will be independent with all HEP issued as of last visit   Time 10   Period Weeks   Status On-going   PT LONG TERM GOAL #3   Title She will be able to lift legs on /off mat without assitance.    Time 10   Period Weeks   Status On-going   PT LONG TERM GOAL #4   Title She will be able to stand one leg RT 5 sec and LT 8-10 sec to demo improved stability on one leg for improved stability with walking   Time 10   Period Weeks   Status On-going   PT LONG TERM GOAL #5   Title She will be able to walk up and down stairs with one rail step over step safely to demo improved gait and balance/strength   Time 10   Period Weeks   Status On-going               Plan - 06/19/15 1418    Clinical Impression Statement She came in today with [facility] and is not steady but was able to walk back in clinic without LOB.   She is able to do exercisdes without incr pain. Cont to struggle with hip weakness   PT Next Visit Plan continue with  Bilateral LE strengthening; Nu-Step; moist heat as needed for pain control   PT Home Exercise Plan use walker   Consulted and Agree with Plan of Care Patient        Problem List Patient Active Problem List   Diagnosis Date Noted  . Syncopal episodes 05/16/2015  . Acute renal injury 05/16/2015  . Elevated troponin 05/16/2015  . Hypertension 05/16/2015  . Non  compliance w medication regimen 05/16/2015  . Pulmonary nodule 05/16/2015  . Pressure ulcer 02/28/2015  . Dehydration 02/26/2015  . Intractable vomiting with nausea 02/26/2015  . Hypokalemia 02/25/2015  . Abdominal pain   . Primary osteoarthritis of right hip 02/15/2015  . Severe recurrent major depressive disorder with psychotic features 09/01/2014  . GAD (generalized anxiety disorder) 09/01/2014  . PTSD (post-traumatic stress disorder) 09/01/2014  . Hip arthritis left 12/23/2011  . 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

## 2015-06-21 ENCOUNTER — Ambulatory Visit: Payer: Medicare Other | Admitting: Physical Therapy

## 2015-06-26 ENCOUNTER — Other Ambulatory Visit: Payer: Self-pay

## 2015-06-26 DIAGNOSIS — Z1231 Encounter for screening mammogram for malignant neoplasm of breast: Secondary | ICD-10-CM

## 2015-06-27 ENCOUNTER — Ambulatory Visit: Payer: Medicare Other | Admitting: Physical Therapy

## 2015-06-28 ENCOUNTER — Encounter: Payer: Medicare Other | Admitting: Physical Therapy

## 2015-06-29 ENCOUNTER — Ambulatory Visit: Payer: Medicare Other | Admitting: Physical Therapy

## 2015-06-29 DIAGNOSIS — R29898 Other symptoms and signs involving the musculoskeletal system: Secondary | ICD-10-CM | POA: Diagnosis not present

## 2015-06-29 DIAGNOSIS — R262 Difficulty in walking, not elsewhere classified: Secondary | ICD-10-CM

## 2015-06-29 DIAGNOSIS — R2689 Other abnormalities of gait and mobility: Secondary | ICD-10-CM

## 2015-06-29 NOTE — Therapy (Signed)
[place], [place], [address] Phone: [phone]   Fax:  [phone]  Physical Therapy Treatment/Discharge Summary  Patient Details  Name: Bailey [name] MRN: [id number] Date of Birth: [birth year] Referring Provider:  Wenda Low, MD  Encounter Date: 06/29/2015      PT End of Session - 06/29/15 1556    Visit Number 8   Number of Visits 20   Date for PT Re-Evaluation 08/08/15   Authorization Type Medicare    KX at 12   PT Start Time 1400   PT Stop Time 1453   PT Time Calculation (min) 53 min   Activity Tolerance Patient tolerated treatment well      Past Medical History  Diagnosis Date  . Hypertension     all BP meds. managed by Dr. Deforest Hoyles    . Stroke     2005, R sided weakness   . Pneumonia     while at [facility].- 1990's told that she had pneumonia & was isolated   . Arthritis     OA- R knee, L hip   . Anxiety     "Breakdown" in the 1990's, hosp. for >1 wk. at [facility]    . Incontinence of urine     weak bladder  . Hypertension   . Stroke 2005    ? weakness in hand and arms per mother. patient unsure  . Asthma     years ago. "no problems in a long while."  . Anxiety   . Depression   . GERD (gastroesophageal reflux disease)   . Constipation   . Goiter     removed  at age 34    Past Surgical History  Procedure Laterality Date  . Thyroidectomy, partial    . Tubal ligation    . Dilation and curettage of uterus      1980's  . Total knee arthroplasty  10/21/2011    Procedure: TOTAL KNEE ARTHROPLASTY;  Surgeon: Kerin Salen, MD;  Location: [facility];  Service: Orthopedics;  Laterality: Right;  DEPUY SIGMA RP  . Joint replacement  10/2011    Rt knee  . Total hip arthroplasty  12/23/2011    Procedure: TOTAL HIP ARTHROPLASTY;  Surgeon: Kerin Salen, MD;  Location: [facility];  Service: Orthopedics;  Laterality: Left;  DEPUY/PINNACLE CUPS/SROM STEM  . Colonoscopy    . Joint replacement  Right     knee  . Total hip arthroplasty Left   . Abdominal hysterectomy    . Tonsillectomy    . Goiter      removed age 70  . Total hip arthroplasty Right 02/15/2015    Procedure: TOTAL HIP ARTHROPLASTY;  Surgeon: Frederik Pear, MD;  Location: [facility];  Service: Orthopedics;  Laterality: Right;    There were no vitals filed for this visit.  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  Presents with her rolling walker.  States her BP is coming down.  "I do good as far as the pain goes."   How long can you walk comfortably? at least 100 feet maybe longer   Currently in Pain? No/denies   Pain Score 0-No pain   Aggravating Factors  being on my feet for a length of time  [facility] PT Assessment - 06/29/15 1433    AROM   Right Knee Extension 0   Right Knee Flexion 120   Left Knee Extension 0   Left Knee Flexion 125   Strength   Right Hip Flexion 4/5   Right Hip ABduction 3/5   Left Hip Flexion 4+/5   Left Hip Extension 4/5   Left Hip ADduction 4/5   Right Ankle Plantar Flexion 2+/5   Left Ankle Dorsiflexion 2+/5   Transfers   Five time sit to stand comments  15.5 sec       Unable to single leg stand without UE support.               [facility] Adult PT Treatment/Exercise - 06/29/15 1433    Bed Mobility   Bed Mobility --  able to do without UE assist   Ambulation/Gait   Ambulation/Gait Yes   Pre-Gait Activities TUG 26.6 with RW   Gait Comments 10 m walk with RW ,75msec   Therapeutic Activites    Therapeutic Activities ADL's   ADL's Up and down steps reciprocally and one at a time with 1 and 2 rails and with/without walker   Knee/Hip Exercises: Aerobic   Nustep L6 10 min   Knee/Hip Exercises: Standing   Other Standing Knee Exercises Review  and discussion of safe self progression of HEP including inner thigh stretch, leg raises, heel raises   Knee/Hip Exercises: Supine   Bridges --  review for HEP   Knee/Hip Exercises: Sidelying   Clams Review of HEP                PT Education - 06/29/15 1556    Education provided Yes   Education Details discussion of safe self progression of HEP and areas of emphasis   Person(s) Educated Patient   Methods Explanation   Comprehension Verbalized understanding          PT Short Term Goals - 06/29/15 1607    PT SHORT TERM GOAL #1   Title She will be independent with all inital HEp   Status Achieved   PT SHORT TERM GOAL #2   Title She will be able to stand from mat without hands x 10 reps.    Status Partially Met   PT SHORT TERM GOAL #3   Title She will walk with CGA with SPC 200 feet or more   Status Not Met   PT SHORT TERM GOAL #4   Title She will demo 3/5 RT hip strength abduct/extesnion for improved stability with walking   Status Achieved           PT Long Term Goals - 06/29/15 1607    PT LONG TERM GOAL #1   Title she will have 4/5 RT hip strengh or better to allow fotr walking with SPC in home safely   Status Partially Met   PT LSherando#2   Title She will be independent with all HEP issued as of last visit   Status Achieved   PT LONG TERM GOAL #3   Title She will be able to lift legs on /off mat without assitance.    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  She decides she is ready for discharge from PT at this time.  She has made improvements in bilateral LE strength but continued deficits in strength and balance still put her at risk for falls;  therefore we have recommended continued use of her RW vs. cane.  Her gait speed is below acceptable level for a community ambulator .36msec vs. .861mec.  She plans to continue with her HEP for further gains in strength and function.  Partial goals met.  Discharge from PT at patient's request.            G-Codes - 09Oct 15, 2016608    Functional Assessment Tool Used clinical judgement   Functional Limitation Mobility: Walking and moving around   Mobility: Walking and Moving Around Goal Status ([phone] least 20 percent but less than 40 percent impaired, limited or restricted   Mobility: Walking and Moving Around Discharge Status ([phone] least 40 percent but less than 60 percent impaired, limited or restricted      Problem List Patient Active Problem List   Diagnosis Date Noted  . Syncopal episodes 05/16/2015  . Acute renal injury 05/16/2015  . Elevated troponin 05/16/2015  . Hypertension 05/16/2015  . Non compliance w medication regimen 05/16/2015  . Pulmonary nodule 05/16/2015  . Pressure ulcer 02/28/2015  . Dehydration 02/26/2015  . Intractable vomiting with nausea 02/26/2015  . Hypokalemia 02/25/2015  . Abdominal pain   . Primary osteoarthritis of right hip 02/15/2015  . Severe recurrent major depressive disorder with psychotic features 09/01/2014  . GAD (generalized anxiety disorder) 09/01/2014  . PTSD (post-traumatic stress disorder) 09/01/2014  . Hip arthritis left 12/23/2011  . 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  Patient  requested discharge at this time.   Remaining deficits: Intermittent left groin discomfort   Education / Equipment: HEP Plan: Patient agrees to discharge.  Patient goals were partially met. Patient is being discharged due to the patient's request.  ?????     StRuben ImPT 0910/15/2016:11 PM Phone: [phone]: [phone]        SiAlvera Singh/15-Oct-20164:09 PM  [id number]: [phone] Fax:  [phone]

## 2015-07-27 ENCOUNTER — Ambulatory Visit: Payer: Medicare Other

## 2015-09-15 ENCOUNTER — Ambulatory Visit
Admission: RE | Admit: 2015-09-15 | Discharge: 2015-09-15 | Disposition: A | Discharge status: Home / Self Care | Payer: Medicare Other | Source: Ambulatory Visit | Attending: Nurse Practitioner | Admitting: Nurse Practitioner

## 2015-09-15 ENCOUNTER — Other Ambulatory Visit: Payer: Self-pay | Admitting: Nurse Practitioner

## 2015-09-15 DIAGNOSIS — M25532 Pain in left wrist: Secondary | ICD-10-CM

## 2015-10-23 ENCOUNTER — Ambulatory Visit: Payer: Medicare Other

## 2015-11-06 ENCOUNTER — Ambulatory Visit
Admission: RE | Admit: 2015-11-06 | Discharge: 2015-11-06 | Disposition: A | Discharge status: Home / Self Care | Payer: Medicare Other | Source: Ambulatory Visit

## 2015-11-06 DIAGNOSIS — Z1231 Encounter for screening mammogram for malignant neoplasm of breast: Secondary | ICD-10-CM

## 2016-02-22 ENCOUNTER — Emergency Department (HOSPITAL_COMMUNITY)
Admission: EM | Admit: 2016-02-22 | Discharge: 2016-02-22 | Disposition: A | Discharge status: Home / Self Care | Payer: No Typology Code available for payment source | Attending: Emergency Medicine | Admitting: Emergency Medicine

## 2016-02-22 ENCOUNTER — Encounter (HOSPITAL_COMMUNITY): Payer: Self-pay

## 2016-02-22 ENCOUNTER — Emergency Department (HOSPITAL_COMMUNITY): Payer: No Typology Code available for payment source

## 2016-02-22 DIAGNOSIS — M1990 Unspecified osteoarthritis, unspecified site: Secondary | ICD-10-CM | POA: Insufficient documentation

## 2016-02-22 DIAGNOSIS — S3991XA Unspecified injury of abdomen, initial encounter: Secondary | ICD-10-CM | POA: Diagnosis present

## 2016-02-22 DIAGNOSIS — Y92410 Unspecified street and highway as the place of occurrence of the external cause: Secondary | ICD-10-CM | POA: Insufficient documentation

## 2016-02-22 DIAGNOSIS — F329 Major depressive disorder, single episode, unspecified: Secondary | ICD-10-CM | POA: Insufficient documentation

## 2016-02-22 DIAGNOSIS — K219 Gastro-esophageal reflux disease without esophagitis: Secondary | ICD-10-CM | POA: Insufficient documentation

## 2016-02-22 DIAGNOSIS — F419 Anxiety disorder, unspecified: Secondary | ICD-10-CM | POA: Insufficient documentation

## 2016-02-22 DIAGNOSIS — Z8673 Personal history of transient ischemic attack (TIA), and cerebral infarction without residual deficits: Secondary | ICD-10-CM | POA: Diagnosis not present

## 2016-02-22 DIAGNOSIS — Y9389 Activity, other specified: Secondary | ICD-10-CM | POA: Diagnosis not present

## 2016-02-22 DIAGNOSIS — R1030 Lower abdominal pain, unspecified: Secondary | ICD-10-CM

## 2016-02-22 DIAGNOSIS — I10 Essential (primary) hypertension: Secondary | ICD-10-CM | POA: Diagnosis not present

## 2016-02-22 DIAGNOSIS — Z7982 Long term (current) use of aspirin: Secondary | ICD-10-CM | POA: Diagnosis not present

## 2016-02-22 DIAGNOSIS — Z79899 Other long term (current) drug therapy: Secondary | ICD-10-CM | POA: Diagnosis not present

## 2016-02-22 DIAGNOSIS — Y998 Other external cause status: Secondary | ICD-10-CM | POA: Diagnosis not present

## 2016-02-22 DIAGNOSIS — J45909 Unspecified asthma, uncomplicated: Secondary | ICD-10-CM | POA: Insufficient documentation

## 2016-02-22 DIAGNOSIS — Z8701 Personal history of pneumonia (recurrent): Secondary | ICD-10-CM | POA: Insufficient documentation

## 2016-02-22 DIAGNOSIS — S29001A Unspecified injury of muscle and tendon of front wall of thorax, initial encounter: Secondary | ICD-10-CM | POA: Diagnosis not present

## 2016-02-22 DIAGNOSIS — Z8639 Personal history of other endocrine, nutritional and metabolic disease: Secondary | ICD-10-CM | POA: Diagnosis not present

## 2016-02-22 LAB — CBC WITH DIFFERENTIAL/PLATELET
Basophils Absolute: 0 10*3/uL (ref 0.0–0.1)
Basophils Relative: 1 %
Eosinophils Absolute: 0.1 10*3/uL (ref 0.0–0.7)
Eosinophils Relative: 1 %
HCT: 35 % — ABNORMAL LOW (ref 36.0–46.0)
Hemoglobin: 12.2 g/dL (ref 12.0–15.0)
Lymphocytes Relative: 26 %
Lymphs Abs: 1.5 10*3/uL (ref 0.7–4.0)
MCH: 28.5 pg (ref 26.0–34.0)
MCHC: 34.9 g/dL (ref 30.0–36.0)
MCV: 81.8 fL (ref 78.0–100.0)
Monocytes Absolute: 0.6 10*3/uL (ref 0.1–1.0)
Monocytes Relative: 10 %
Neutro Abs: 3.7 10*3/uL (ref 1.7–7.7)
Neutrophils Relative %: 63 %
Platelets: 213 10*3/uL (ref 150–400)
RBC: 4.28 MIL/uL (ref 3.87–5.11)
RDW: 14.1 % (ref 11.5–15.5)
WBC: 5.9 10*3/uL (ref 4.0–10.5)

## 2016-02-22 LAB — URINALYSIS, ROUTINE W REFLEX MICROSCOPIC
Bilirubin Urine: NEGATIVE
Glucose, UA: NEGATIVE mg/dL
Hgb urine dipstick: NEGATIVE
Ketones, ur: NEGATIVE mg/dL
Leukocytes, UA: NEGATIVE
Nitrite: NEGATIVE
Protein, ur: NEGATIVE mg/dL
Specific Gravity, Urine: 1.045 — ABNORMAL HIGH (ref 1.005–1.030)
pH: 7.5 (ref 5.0–8.0)

## 2016-02-22 LAB — COMPREHENSIVE METABOLIC PANEL
ALT: 19 U/L (ref 14–54)
AST: 23 U/L (ref 15–41)
Albumin: 3.4 g/dL — ABNORMAL LOW (ref 3.5–5.0)
Alkaline Phosphatase: 79 U/L (ref 38–126)
Anion gap: 7 (ref 5–15)
BUN: 15 mg/dL (ref 6–20)
CO2: 29 mmol/L (ref 22–32)
Calcium: 8.6 mg/dL — ABNORMAL LOW (ref 8.9–10.3)
Chloride: 102 mmol/L (ref 101–111)
Creatinine, Ser: 0.98 mg/dL (ref 0.44–1.00)
GFR calc Af Amer: >60 mL/min (ref >60)
GFR calc non Af Amer: >60 mL/min (ref >60)
Glucose, Bld: 83 mg/dL (ref 65–99)
Potassium: 3.6 mmol/L (ref 3.5–5.1)
Sodium: 138 mmol/L (ref 135–145)
Total Bilirubin: 1.5 mg/dL — ABNORMAL HIGH (ref 0.3–1.2)
Total Protein: 6.2 g/dL — ABNORMAL LOW (ref 6.5–8.1)

## 2016-02-22 MED ORDER — HYDROCODONE-ACETAMINOPHEN 5-325 MG PO TABS: 1.0000 | ORAL_TABLET | ORAL | Status: DC | PRN

## 2016-02-22 MED ORDER — IOPAMIDOL (ISOVUE-300) INJECTION 61%
INTRAVENOUS | Status: AC
  Administered 2016-02-22: 100 mL
  Filled 2016-02-22: qty 100

## 2016-02-22 MED ORDER — KETOROLAC TROMETHAMINE 15 MG/ML IJ SOLN
15.0000 mg | Freq: Once | INTRAMUSCULAR | Status: AC
Start: 2016-02-22 — End: 2016-02-22
  Administered 2016-02-22: 15 mg via INTRAVENOUS
  Filled 2016-02-22: qty 1

## 2016-02-22 MED ORDER — FENTANYL CITRATE (PF) 100 MCG/2ML IJ SOLN
50.0000 ug | Freq: Once | INTRAMUSCULAR | Status: AC
  Administered 2016-02-22: 50 ug via INTRAVENOUS
  Filled 2016-02-22: qty 2

## 2016-02-22 MED ORDER — HYDROCODONE-ACETAMINOPHEN 5-325 MG PO TABS
1.0000 | ORAL_TABLET | Freq: Once | ORAL | Status: AC
  Administered 2016-02-22: 1 via ORAL
  Filled 2016-02-22: qty 1
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
## 2016-02-22 NOTE — ED Notes (Signed)
Erin RN made aware of elevated BP 
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
## 2016-06-14 ENCOUNTER — Other Ambulatory Visit: Payer: Self-pay

## 2016-06-14 NOTE — Patient Outreach (Signed)
[facility]) Care Management  06/14/2016  [name] Sep 23, 1956 [id number]     Telephone Screen  Referral Date: 06/13/16 Referral Source: MD office(Dr. Donette LarryHusain) Referral Reason: "HTN, CVA, CKD"     Outreach attempt # 1 to patient. Spoke with patient. Patient sated "I have not had my coffee and meds yet today. Frances FurbishBayada came yesterday and asked me the same questions you are going to ask. I feel that I'm gonna get short with you, now is not a good time for me to talk so call me back later."     Plan: RN CM will make outreach attempt to patient at later time within the next week.   Antionette Fairyoshanda Mare Ludtke, RN,BSN,CCM [facility] Care Management Telephonic Care Management Coordinator Direct Phone: [phone] Toll Free: [phone] Fax: [phone]

## 2016-06-17 ENCOUNTER — Other Ambulatory Visit: Payer: Self-pay

## 2016-06-17 NOTE — Patient Outreach (Signed)
[facility] Care Management  06/17/2016  Harvest DarkMarguerite B Schneider Nov 14, 1955 [id number]    Telephone Screen  Referral Date: 06/13/16 Referral Source: MD office(Dr. Donette LarryHusain) Referral Reason: "HTN, CVA, CKD"    Outreach attempt #2 to patient. No answer at present. RN CM unable to leave message.    Plan: RN CM will make outreach attempt to patient within a week.   Antionette Fairyoshanda Yazeed Pryer, RN,BSN,CCM [facility] Care Management Telephonic Care Management Coordinator Direct Phone: [phone] Toll Free: [phone] Fax: [phone]

## 2016-06-19 ENCOUNTER — Other Ambulatory Visit: Payer: Self-pay

## 2016-06-19 NOTE — Patient Outreach (Signed)
[facility] Care Management  06/19/2016  Bailey [name] [birth year] [id number]   Telephone Screen  Referral Date: 06/13/16 Referral Source: MD office(Dr. Donette LarryHusain) Referral Reason: "HTN, CVA, CKD"    Outreach attempt #3 to patient. Line busy and call would not go through.    Plan: RN CM will attempt outreach call to patient within a week.  Antionette Fairyoshanda Yuri Fana, RN,BSN,CCM [facility] Care Management Telephonic Care Management Coordinator Direct Phone: [phone] Toll Free: [phone] Fax: [phone]

## 2016-06-21 ENCOUNTER — Other Ambulatory Visit: Payer: Self-pay

## 2016-06-21 NOTE — Patient Outreach (Signed)
[facility]) Care Management  06/21/2016  Bailey [name] September 05, 1956 [id number]   Telephone Screen  Referral Date: 06/13/16 Referral Source: MD office(Dr. Donette LarryHusain) Referral Reason: "HTN, CVA, CKD"    Outreach attempt #3 to patient. No answer and unable to leave message.    Plan: RN CM will send unsuccessful outreach letter to patient. Case will be closed if no response from patient within 10 business days.   [name], RN,BSN,CCM [facility] Care Management Telephonic Care Management Coordinator Direct Phone: [phone] Toll Free: [phone] Fax: [phone]

## 2016-06-24 ENCOUNTER — Encounter (HOSPITAL_COMMUNITY): Payer: Self-pay | Admitting: Emergency Medicine

## 2016-06-24 ENCOUNTER — Emergency Department (HOSPITAL_COMMUNITY)
Admission: EM | Admit: 2016-06-24 | Discharge: 2016-06-24 | Disposition: A | Discharge status: Home / Self Care | Payer: Medicare Other | Attending: Emergency Medicine | Admitting: Emergency Medicine

## 2016-06-24 DIAGNOSIS — Z8673 Personal history of transient ischemic attack (TIA), and cerebral infarction without residual deficits: Secondary | ICD-10-CM | POA: Insufficient documentation

## 2016-06-24 DIAGNOSIS — Z96651 Presence of right artificial knee joint: Secondary | ICD-10-CM | POA: Insufficient documentation

## 2016-06-24 DIAGNOSIS — J45909 Unspecified asthma, uncomplicated: Secondary | ICD-10-CM | POA: Insufficient documentation

## 2016-06-24 DIAGNOSIS — Z5321 Procedure and treatment not carried out due to patient leaving prior to being seen by health care provider: Secondary | ICD-10-CM | POA: Diagnosis not present

## 2016-06-24 DIAGNOSIS — I10 Essential (primary) hypertension: Secondary | ICD-10-CM | POA: Diagnosis not present

## 2016-06-24 DIAGNOSIS — Z96643 Presence of artificial hip joint, bilateral: Secondary | ICD-10-CM | POA: Diagnosis not present

## 2016-06-24 DIAGNOSIS — Z7982 Long term (current) use of aspirin: Secondary | ICD-10-CM | POA: Insufficient documentation

## 2016-06-24 LAB — CBC WITH DIFFERENTIAL/PLATELET
BASOS PCT: 1 %
Basophils Absolute: 0 10*3/uL (ref 0.0–0.1)
EOS ABS: 0.2 10*3/uL (ref 0.0–0.7)
Eosinophils Relative: 6 %
HCT: 35.4 % — ABNORMAL LOW (ref 36.0–46.0)
HEMOGLOBIN: 12.1 g/dL (ref 12.0–15.0)
Lymphocytes Relative: 52 %
Lymphs Abs: 1.7 10*3/uL (ref 0.7–4.0)
MCH: 28.6 pg (ref 26.0–34.0)
MCHC: 34.2 g/dL (ref 30.0–36.0)
MCV: 83.7 fL (ref 78.0–100.0)
MONOS PCT: 8 %
Monocytes Absolute: 0.3 10*3/uL (ref 0.1–1.0)
NEUTROS PCT: 33 %
Neutro Abs: 1.1 10*3/uL — ABNORMAL LOW (ref 1.7–7.7)
Platelets: 240 10*3/uL (ref 150–400)
RBC: 4.23 MIL/uL (ref 3.87–5.11)
RDW: 14.4 % (ref 11.5–15.5)
WBC: 3.2 10*3/uL — ABNORMAL LOW (ref 4.0–10.5)

## 2016-06-24 LAB — COMPREHENSIVE METABOLIC PANEL
ALBUMIN: 3.6 g/dL (ref 3.5–5.0)
ALK PHOS: 90 U/L (ref 38–126)
ALT: 12 U/L — ABNORMAL LOW (ref 14–54)
ANION GAP: 6 (ref 5–15)
AST: 13 U/L — ABNORMAL LOW (ref 15–41)
BUN: 12 mg/dL (ref 6–20)
CO2: 30 mmol/L (ref 22–32)
Calcium: 9 mg/dL (ref 8.9–10.3)
Chloride: 107 mmol/L (ref 101–111)
Creatinine, Ser: 0.96 mg/dL (ref 0.44–1.00)
GFR calc Af Amer: >60 mL/min (ref >60)
GFR calc non Af Amer: >60 mL/min (ref >60)
GLUCOSE: 88 mg/dL (ref 65–99)
POTASSIUM: 4.1 mmol/L (ref 3.5–5.1)
SODIUM: 143 mmol/L (ref 135–145)
Total Bilirubin: 1.2 mg/dL (ref 0.3–1.2)
Total Protein: 6.4 g/dL — ABNORMAL LOW (ref 6.5–8.1)

## 2016-06-24 NOTE — ED Triage Notes (Addendum)
Here for High BP and h/a has been going on for  A week, was seen by her dr on Friday and told to go to er , has been under a lot of stress lately due to mother being sick.  No cp or sob she states

## 2016-06-24 NOTE — ED Notes (Signed)
Pt called for reassessment x3 no answer

## 2016-06-24 NOTE — ED Notes (Signed)
Called to Reassess vitals. No answer

## 2016-07-05 ENCOUNTER — Other Ambulatory Visit: Payer: Self-pay

## 2016-07-05 NOTE — Patient Outreach (Signed)
[facility]) Care Management  07/05/2016  Bailey [name] 08-14-1956 [id number]   Telephone Screen  Referral Date: 06/13/16 Referral Source: MD office(Dr. Donette LarryHusain) Referral Reason: "HTN, CVA, CKD"    Multiple attempts to establish contact with patient. No response from patient in regards to letter mailed to her.    Plan: RN CM will close case at this time. RN CM will notify [facility] administrative assistant of case closure. RN CM will send MD case closure letter.  Bailey Fairyoshanda Ladelle Teodoro, RN,BSN,CCM [facility] Care Management Telephonic Care Management Coordinator Direct Phone: [phone] Toll Free: [phone] Fax: [phone]

## 2016-11-04 ENCOUNTER — Other Ambulatory Visit: Payer: Self-pay | Admitting: Internal Medicine

## 2016-11-04 DIAGNOSIS — R1900 Intra-abdominal and pelvic swelling, mass and lump, unspecified site: Secondary | ICD-10-CM

## 2016-11-06 ENCOUNTER — Ambulatory Visit
Admission: RE | Admit: 2016-11-06 | Discharge: 2016-11-06 | Disposition: A | Discharge status: Home / Self Care | Payer: Medicare Other | Source: Ambulatory Visit | Attending: Internal Medicine | Admitting: Internal Medicine

## 2016-11-06 DIAGNOSIS — R1900 Intra-abdominal and pelvic swelling, mass and lump, unspecified site: Secondary | ICD-10-CM

## 2016-11-06 MED ORDER — IOPAMIDOL (ISOVUE-300) INJECTION 61%
100.0000 mL | Freq: Once | INTRAVENOUS | Status: AC | PRN
  Administered 2016-11-06: 100 mL via INTRAVENOUS

## 2016-11-21 ENCOUNTER — Other Ambulatory Visit: Payer: Self-pay | Admitting: Internal Medicine

## 2016-11-21 DIAGNOSIS — Z1231 Encounter for screening mammogram for malignant neoplasm of breast: Secondary | ICD-10-CM

## 2016-12-09 ENCOUNTER — Ambulatory Visit: Payer: Self-pay

## 2016-12-26 ENCOUNTER — Ambulatory Visit
Admission: RE | Admit: 2016-12-26 | Discharge: 2016-12-26 | Disposition: A | Discharge status: Home / Self Care | Payer: Medicare Other | Source: Ambulatory Visit | Attending: Internal Medicine | Admitting: Internal Medicine

## 2016-12-26 DIAGNOSIS — Z1231 Encounter for screening mammogram for malignant neoplasm of breast: Secondary | ICD-10-CM

## 2017-01-30 ENCOUNTER — Encounter (HOSPITAL_COMMUNITY): Payer: Self-pay | Admitting: Emergency Medicine

## 2017-01-30 ENCOUNTER — Emergency Department (HOSPITAL_COMMUNITY)
Admission: EM | Admit: 2017-01-30 | Discharge: 2017-01-30 | Disposition: A | Discharge status: Home / Self Care | Payer: Medicare Other | Attending: Emergency Medicine | Admitting: Emergency Medicine

## 2017-01-30 ENCOUNTER — Emergency Department (HOSPITAL_COMMUNITY): Payer: Medicare Other

## 2017-01-30 DIAGNOSIS — Z8673 Personal history of transient ischemic attack (TIA), and cerebral infarction without residual deficits: Secondary | ICD-10-CM | POA: Diagnosis not present

## 2017-01-30 DIAGNOSIS — I10 Essential (primary) hypertension: Secondary | ICD-10-CM | POA: Diagnosis not present

## 2017-01-30 DIAGNOSIS — J45909 Unspecified asthma, uncomplicated: Secondary | ICD-10-CM | POA: Insufficient documentation

## 2017-01-30 DIAGNOSIS — Z79899 Other long term (current) drug therapy: Secondary | ICD-10-CM | POA: Diagnosis not present

## 2017-01-30 DIAGNOSIS — Z7982 Long term (current) use of aspirin: Secondary | ICD-10-CM | POA: Insufficient documentation

## 2017-01-30 DIAGNOSIS — R569 Unspecified convulsions: Secondary | ICD-10-CM | POA: Insufficient documentation

## 2017-01-30 DIAGNOSIS — Z96643 Presence of artificial hip joint, bilateral: Secondary | ICD-10-CM | POA: Insufficient documentation

## 2017-01-30 DIAGNOSIS — Z96651 Presence of right artificial knee joint: Secondary | ICD-10-CM | POA: Diagnosis not present

## 2017-01-30 LAB — BASIC METABOLIC PANEL
Anion gap: 12 (ref 5–15)
BUN: 12 mg/dL (ref 6–20)
CO2: 25 mmol/L (ref 22–32)
Calcium: 9.3 mg/dL (ref 8.9–10.3)
Chloride: 104 mmol/L (ref 101–111)
Creatinine, Ser: 1.01 mg/dL — ABNORMAL HIGH (ref 0.44–1.00)
GFR calc Af Amer: >60 mL/min (ref >60)
GFR calc non Af Amer: 58 mL/min — ABNORMAL LOW (ref >60)
Glucose, Bld: 115 mg/dL — ABNORMAL HIGH (ref 65–99)
Potassium: 3.1 mmol/L — ABNORMAL LOW (ref 3.5–5.1)
Sodium: 141 mmol/L (ref 135–145)

## 2017-01-30 LAB — URINALYSIS, ROUTINE W REFLEX MICROSCOPIC
Bacteria, UA: NONE SEEN
Bilirubin Urine: NEGATIVE
Glucose, UA: NEGATIVE mg/dL
Ketones, ur: NEGATIVE mg/dL
Leukocytes, UA: NEGATIVE
Nitrite: NEGATIVE
Protein, ur: 100 mg/dL — AB
Specific Gravity, Urine: 1.01 (ref 1.005–1.030)
pH: 6 (ref 5.0–8.0)

## 2017-01-30 LAB — CBC WITH DIFFERENTIAL/PLATELET
Basophils Absolute: 0 10*3/uL (ref 0.0–0.1)
Basophils Relative: 1 %
Eosinophils Absolute: 0 10*3/uL (ref 0.0–0.7)
Eosinophils Relative: 0 %
HCT: 38.5 % (ref 36.0–46.0)
Hemoglobin: 14 g/dL (ref 12.0–15.0)
Lymphocytes Relative: 18 %
Lymphs Abs: 1.1 10*3/uL (ref 0.7–4.0)
MCH: 29.6 pg (ref 26.0–34.0)
MCHC: 36.4 g/dL — ABNORMAL HIGH (ref 30.0–36.0)
MCV: 81.4 fL (ref 78.0–100.0)
Monocytes Absolute: 0.7 10*3/uL (ref 0.1–1.0)
Monocytes Relative: 12 %
Neutro Abs: 4.1 10*3/uL (ref 1.7–7.7)
Neutrophils Relative %: 69 %
Platelets: 240 10*3/uL (ref 150–400)
RBC: 4.73 MIL/uL (ref 3.87–5.11)
RDW: 13.9 % (ref 11.5–15.5)
WBC: 5.9 10*3/uL (ref 4.0–10.5)

## 2017-01-30 MED ORDER — METOPROLOL SUCCINATE ER 100 MG PO TB24
100.0000 mg | ORAL_TABLET | Freq: Once | ORAL | Status: AC
  Administered 2017-01-30: 100 mg via ORAL
  Filled 2017-01-30: qty 1

## 2017-01-30 MED ORDER — CLONIDINE HCL 0.2 MG PO TABS
0.3000 mg | ORAL_TABLET | Freq: Once | ORAL | Status: AC
  Administered 2017-01-30: 08:00:00 0.3 mg via ORAL
  Filled 2017-01-30: qty 1
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
## 2017-01-30 NOTE — Discharge Planning (Signed)
Pt up for discharge. EDCM reviewed chart for possible CM needs.  No needs identified or communicated.  

## 2017-01-30 NOTE — ED Triage Notes (Addendum)
Pt. arrived with EMS reports grand mal seizure this morning approx. 1-2 mins duration , CBG=149 , hypertensive 240/140 per EMS , urinary incontinence during seizure ,  alert and oriented/respirations unlabored at arrival , [facility]. [facility] .

## 2017-01-30 NOTE — ED Provider Notes (Signed)
[facility] DEPT Provider Note   CSN: [id number] Arrival date & time: 01/30/17  0645     History   Chief Complaint Chief Complaint  Patient presents with  . Seizures    HPI Bailey [name] is a 61 y.o. female.  HPI   61 year old female possible seizure. Apparently witnessed by a friend. Patient is amnestic to the events. She is incontinent of urine. No oral trauma. She has no acute complaints currently aside from feeling a bit tired. She has a past history of CVA with residual right-sided weakness. She denies any acute change in this or other focal neurologic deficits.  Past Medical History:  Diagnosis Date  . Anxiety    "Breakdown" in the 1990's, hosp. for >1 wk. at [facility]    . Anxiety   . Arthritis    OA- R knee, L hip   . Asthma    years ago. "no problems in a long while."  . Constipation   . Depression   . GERD (gastroesophageal reflux disease)   . Goiter    removed  at age 89  . Hypertension    all BP meds. managed by Dr. Eula Listen    . Hypertension   . Incontinence of urine    weak bladder  . Pneumonia    while at [facility] 1990's told that she had pneumonia & was isolated   . Stroke (HCC)    2005, R sided weakness   . Stroke [facility]) 2005   ? weakness in hand and arms per mother. patient unsure    Patient Active Problem List   Diagnosis Date Noted  . Syncopal episodes 05/16/2015  . Acute renal injury (HCC) 05/16/2015  . Elevated troponin 05/16/2015  . Hypertension 05/16/2015  . Non compliance w medication regimen 05/16/2015  . Pulmonary nodule 05/16/2015  . Pressure ulcer 02/28/2015  . Dehydration 02/26/2015  . Intractable vomiting with nausea 02/26/2015  . Hypokalemia 02/25/2015  . Abdominal pain   . Primary osteoarthritis of right hip 02/15/2015  . Severe recurrent major depressive disorder with psychotic features (HCC) 09/01/2014  . GAD (generalized anxiety disorder) 09/01/2014  . PTSD (post-traumatic stress disorder) 09/01/2014  . Hip  arthritis left 12/23/2011  . 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 ABDOMINAL HYSTERECTOMY    . COLONOSCOPY    . DILATION AND CURETTAGE OF UTERUS     1980's  . goiter     removed age 68  . JOINT REPLACEMENT  10/2011   Rt knee  . JOINT REPLACEMENT Right    knee  . THYROIDECTOMY, PARTIAL    . TONSILLECTOMY    . TOTAL HIP ARTHROPLASTY  12/23/2011   Procedure: TOTAL HIP ARTHROPLASTY;  Surgeon: Nestor Lewandowsky, MD;  Location: [facility] OR;  Service: Orthopedics;  Laterality: Left;  DEPUY/PINNACLE CUPS/SROM STEM  . TOTAL HIP ARTHROPLASTY Left   . TOTAL HIP ARTHROPLASTY Right 02/15/2015   Procedure: TOTAL HIP ARTHROPLASTY;  Surgeon: Gean Birchwood, MD;  Location: [facility] OR;  Service: Orthopedics;  Laterality: Right;  . TOTAL KNEE ARTHROPLASTY  10/21/2011   Procedure: TOTAL KNEE ARTHROPLASTY;  Surgeon: Nestor Lewandowsky, MD;  Location: [facility] OR;  Service: Orthopedics;  Laterality: Right;  DEPUY SIGMA RP  . TUBAL LIGATION      OB History    Gravida Para Term Preterm AB Living   0 0 0 0 0     SAB TAB Ectopic Multiple Live Births  0 0 0           Home Medications    Prior to Admission medications   Medication Sig Start Date End Date Taking? Authorizing Provider  acetaminophen (TYLENOL) 325 MG tablet Take 2 tablets (650 mg total) by mouth every 6 (six) hours as needed for mild pain (or Fever >/= 101). 05/18/15   Esperanza SheetsUlugbek N Buriev, MD  aspirin EC 325 MG tablet Take 1 tablet (325 mg total) by mouth 2 (two) times daily. 02/17/15   Allena KatzEric K Phillips, PA-C  Cholecalciferol (VITAMIN D) 2000 UNITS tablet Take 2,000 Units by mouth daily.    Historical Provider, MD  citalopram (CELEXA) 40 MG tablet Take 40 mg by mouth daily.     Historical Provider, MD  clonazePAM (KLONOPIN) 0.5 MG tablet Take 0.5 mg by mouth 2 (two) times daily.    Historical Provider, MD  cloNIDine (CATAPRES) 0.3 MG tablet Take 0.3 mg by mouth 2 (two) times daily.    Historical  Provider, MD  doxazosin (CARDURA) 8 MG tablet Take 8 mg by mouth at bedtime.    Historical Provider, MD  Flaxseed, Linseed, (FLAX SEED OIL PO) Take 1,200 mg by mouth daily.    Historical Provider, MD  HYDROcodone-acetaminophen (NORCO/VICODIN) 5-325 MG tablet Take 1 tablet by mouth every 4 (four) hours as needed. 02/22/16   Eyvonne MechanicJeffrey Hedges, PA-C  losartan (COZAAR) 100 MG tablet Take 100 mg by mouth daily.    Historical Provider, MD  metoprolol (TOPROL-XL) 100 MG 24 hr tablet Take 100 mg by mouth daily.     Historical Provider, MD  omeprazole (PRILOSEC) 20 MG capsule Take 40 mg by mouth daily.    Historical Provider, MD  QUEtiapine (SEROQUEL) 50 MG tablet Take 1 tablet (50 mg total) by mouth at bedtime. 09/01/14 09/01/15  Oletta DarterSalina Agarwal, MD  vitamin C (ASCORBIC ACID) 500 MG tablet Take 500 mg by mouth daily.    Historical Provider, MD    Family History Family History  Problem Relation Age of Onset  . Anesthesia problems Neg Hx   . Hypotension Neg Hx   . Malignant hyperthermia Neg Hx   . Pseudochol deficiency Neg Hx   . Alcohol abuse Neg Hx   . Anxiety disorder Neg Hx   . Bipolar disorder Neg Hx   . Depression Neg Hx   . Dementia Neg Hx   . Breast cancer Neg Hx     Social History Social History  Substance Use Topics  . Smoking status: Never Smoker  . Smokeless tobacco: Never Used  . Alcohol use Yes     Comment: 2x's /month, socially       Allergies   Codeine; Sulfa antibiotics; and Sulfa drugs cross reactors   Review of Systems Review of Systems  All systems reviewed and negative, other than as noted in HPI.  Physical Exam Updated Vital Signs BP (!) 205/126 (BP Location: Right Arm)   Pulse (!) 107   Temp 99.2 F (37.3 C) (Oral)   Resp 20   Ht 5\' 3"  (1.6 m)   SpO2 96%   Physical Exam  Constitutional: She is oriented to person, place, and time. She appears well-developed and well-nourished. No distress.  HENT:  Head: Normocephalic and atraumatic.  Eyes: Conjunctivae  are normal. Right eye exhibits no discharge. Left eye exhibits no discharge.  Neck: Neck supple.  Cardiovascular: Normal rate, regular rhythm and normal heart sounds.  Exam reveals no gallop and no friction rub.   No murmur heard. Pulmonary/Chest:  Effort normal and breath sounds normal. No respiratory distress.  Abdominal: Soft. She exhibits no distension. There is no tenderness.  Musculoskeletal: She exhibits no edema or tenderness.  Neurological: She is alert and oriented to person, place, and time. No cranial nerve deficit or sensory deficit.  4 out of 5 strength right upper and lower extremity. 5 out of 5 left side. Cranial nerves II through XII are intact. Speech is clear. She is alert and oriented 3.  Skin: Skin is warm and dry.  Psychiatric: She has a normal mood and affect. Her behavior is normal. Thought content normal.  Nursing note and vitals reviewed.    ED Treatments / Results  Labs (all labs ordered are listed, but only abnormal results are displayed) Labs Reviewed  BASIC METABOLIC PANEL - Abnormal; Notable for the following:       Result Value   Potassium 3.1 (*)    Glucose, Bld 115 (*)    Creatinine, Ser 1.01 (*)    GFR calc non Af Amer 58 (*)    All other components within normal limits  CBC WITH DIFFERENTIAL/PLATELET - Abnormal; Notable for the following:    MCHC 36.4 (*)    All other components within normal limits  URINALYSIS, ROUTINE W REFLEX MICROSCOPIC - Abnormal; Notable for the following:    Hgb urine dipstick SMALL (*)    Protein, ur 100 (*)    Squamous Epithelial / LPF 0-5 (*)    All other components within normal limits    EKG  EKG Interpretation  Date/Time:  Thursday Jan 30 2017 06:52:55 EDT Ventricular Rate:  106 PR Interval:    QRS Duration: 95 QT Interval:  390 QTC Calculation: 518 R Axis:   -45 Text Interpretation:  Sinus tachycardia Biatrial enlargement Left anterior fascicular block Abnormal R-wave progression, late transition Left  ventricular hypertrophy Prolonged QT interval Confirmed by Juleen China  MD, Mardene Lessig [id number]) on 01/30/2017 7:45:36 AM       Radiology  Ct Head Wo Contrast  Result Date: 01/30/2017 CLINICAL DATA:  Recent seizure activity EXAM: CT HEAD WITHOUT CONTRAST TECHNIQUE: Contiguous axial images were obtained from the base of the skull through the vertex without intravenous contrast. COMPARISON:  05/16/2015 FINDINGS: Brain: Mild chronic white matter ischemic change is noted. No findings to suggest acute hemorrhage, acute infarction or space-occupying mass lesion are noted. Vascular: No hyperdense vessel or unexpected calcification. Skull: Normal. Negative for fracture or focal lesion. Sinuses/Orbits: No acute finding. Other: None. IMPRESSION: Chronic white matter ischemic changes. No acute abnormality is noted. Electronically Signed   By: Alcide Clever M.D.   On: 01/30/2017 08:48    Procedures Procedures (including critical care time)  Medications Ordered in ED Medications - No data to display   Initial Impression / Assessment and Plan / ED Course  I have reviewed the triage vital signs and the nursing notes.  Pertinent labs & imaging results that were available during my care of the patient were reviewed by me and considered in my medical decision making (see chart for details).     Final Clinical Impressions(s) / ED Diagnoses   Final diagnoses:  Seizure-like activity [facility])    New Prescriptions New Prescriptions   No medications on file     Raeford Razor, MD 02/04/17 2100

## 2017-02-28 ENCOUNTER — Encounter: Payer: Self-pay | Admitting: Neurology

## 2017-03-11 ENCOUNTER — Encounter: Payer: Self-pay | Admitting: Neurology

## 2017-03-11 ENCOUNTER — Ambulatory Visit (INDEPENDENT_AMBULATORY_CARE_PROVIDER_SITE_OTHER): Payer: Medicare Other | Admitting: Neurology

## 2017-03-11 VITALS — BP 162/100 | HR 60 | Ht 61.5 in | Wt 189.0 lb

## 2017-03-11 DIAGNOSIS — G3184 Mild cognitive impairment, so stated: Secondary | ICD-10-CM | POA: Insufficient documentation

## 2017-03-11 DIAGNOSIS — R292 Abnormal reflex: Secondary | ICD-10-CM

## 2017-03-11 DIAGNOSIS — R55 Syncope and collapse: Secondary | ICD-10-CM | POA: Diagnosis not present

## 2017-03-11 NOTE — Patient Instructions (Addendum)
1. Schedule 1-hour EEG 2. Schedule MRI brain with and without contrast 3. Schedule MRI cervical spine without contrast 4. Continue all your medications 5. Follow-up after the tests
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
## 2017-04-01 ENCOUNTER — Ambulatory Visit
Admission: RE | Admit: 2017-04-01 | Discharge: 2017-04-01 | Disposition: A | Discharge status: Home / Self Care | Payer: Medicare Other | Source: Ambulatory Visit | Attending: Neurology | Admitting: Neurology

## 2017-04-01 DIAGNOSIS — R55 Syncope and collapse: Secondary | ICD-10-CM

## 2017-04-01 DIAGNOSIS — G3184 Mild cognitive impairment, so stated: Secondary | ICD-10-CM

## 2017-04-01 MED ORDER — GADOBENATE DIMEGLUMINE 529 MG/ML IV SOLN
18.0000 mL | Freq: Once | INTRAVENOUS | Status: AC | PRN
  Administered 2017-04-01: 18 mL via INTRAVENOUS

## 2017-04-09 ENCOUNTER — Telehealth: Payer: Self-pay | Admitting: Neurology

## 2017-04-09 NOTE — Telephone Encounter (Signed)
MRI results have not been released to me yet.  Will call pt when available

## 2017-04-09 NOTE — Telephone Encounter (Signed)
Patient would like the test results she believes it was a MRI test

## 2017-04-10 NOTE — Telephone Encounter (Signed)
Discussed MRI brain showing old strokes, no new infarct, tumor, or bleed. There are a lot of changes due to microvascular disease/hypertension, continue control of vascular risk factors, aspirin. All her questions were answered.

## 2017-04-24 ENCOUNTER — Ambulatory Visit: Payer: Self-pay | Admitting: Neurology

## 2017-07-21 ENCOUNTER — Ambulatory Visit: Payer: Self-pay | Admitting: Neurology

## 2017-10-13 ENCOUNTER — Other Ambulatory Visit: Payer: Self-pay | Admitting: Internal Medicine

## 2017-10-13 DIAGNOSIS — R609 Edema, unspecified: Secondary | ICD-10-CM

## 2017-10-13 DIAGNOSIS — M79605 Pain in left leg: Secondary | ICD-10-CM

## 2017-10-14 ENCOUNTER — Ambulatory Visit
Admission: RE | Admit: 2017-10-14 | Discharge: 2017-10-14 | Disposition: A | Discharge status: Home / Self Care | Payer: Medicare Other | Source: Ambulatory Visit | Attending: Internal Medicine | Admitting: Internal Medicine

## 2017-10-14 DIAGNOSIS — M79605 Pain in left leg: Secondary | ICD-10-CM

## 2017-10-14 DIAGNOSIS — R609 Edema, unspecified: Secondary | ICD-10-CM

## 2018-01-12 ENCOUNTER — Other Ambulatory Visit: Payer: Self-pay | Admitting: Internal Medicine

## 2018-01-12 DIAGNOSIS — Z1231 Encounter for screening mammogram for malignant neoplasm of breast: Secondary | ICD-10-CM

## 2018-02-02 ENCOUNTER — Ambulatory Visit
Admission: RE | Admit: 2018-02-02 | Discharge: 2018-02-02 | Disposition: A | Discharge status: Home / Self Care | Payer: Medicare Other | Source: Ambulatory Visit | Attending: Internal Medicine | Admitting: Internal Medicine

## 2018-02-02 DIAGNOSIS — Z1231 Encounter for screening mammogram for malignant neoplasm of breast: Secondary | ICD-10-CM

## 2018-09-30 ENCOUNTER — Encounter (HOSPITAL_COMMUNITY): Payer: Self-pay | Admitting: Emergency Medicine

## 2018-09-30 ENCOUNTER — Other Ambulatory Visit: Payer: Self-pay

## 2018-09-30 ENCOUNTER — Emergency Department (HOSPITAL_COMMUNITY): Payer: Medicare Other

## 2018-09-30 ENCOUNTER — Emergency Department (HOSPITAL_COMMUNITY)
Admission: EM | Admit: 2018-09-30 | Discharge: 2018-10-01 | Disposition: A | Discharge status: Home / Self Care | Payer: Medicare Other | Attending: Emergency Medicine | Admitting: Emergency Medicine

## 2018-09-30 DIAGNOSIS — K458 Other specified abdominal hernia without obstruction or gangrene: Secondary | ICD-10-CM | POA: Diagnosis not present

## 2018-09-30 DIAGNOSIS — S0003XA Contusion of scalp, initial encounter: Secondary | ICD-10-CM | POA: Insufficient documentation

## 2018-09-30 DIAGNOSIS — R51 Headache: Secondary | ICD-10-CM | POA: Diagnosis not present

## 2018-09-30 DIAGNOSIS — Y999 Unspecified external cause status: Secondary | ICD-10-CM | POA: Diagnosis not present

## 2018-09-30 DIAGNOSIS — Y92009 Unspecified place in unspecified non-institutional (private) residence as the place of occurrence of the external cause: Secondary | ICD-10-CM | POA: Diagnosis not present

## 2018-09-30 DIAGNOSIS — S0990XA Unspecified injury of head, initial encounter: Secondary | ICD-10-CM | POA: Insufficient documentation

## 2018-09-30 DIAGNOSIS — Z79899 Other long term (current) drug therapy: Secondary | ICD-10-CM | POA: Insufficient documentation

## 2018-09-30 DIAGNOSIS — W01190A Fall on same level from slipping, tripping and stumbling with subsequent striking against furniture, initial encounter: Secondary | ICD-10-CM | POA: Insufficient documentation

## 2018-09-30 DIAGNOSIS — I10 Essential (primary) hypertension: Secondary | ICD-10-CM | POA: Insufficient documentation

## 2018-09-30 DIAGNOSIS — Y93C1 Activity, computer keyboarding: Secondary | ICD-10-CM | POA: Diagnosis not present

## 2018-09-30 DIAGNOSIS — R609 Edema, unspecified: Secondary | ICD-10-CM

## 2018-09-30 DIAGNOSIS — R600 Localized edema: Secondary | ICD-10-CM | POA: Diagnosis not present

## 2018-09-30 DIAGNOSIS — M542 Cervicalgia: Secondary | ICD-10-CM | POA: Insufficient documentation

## 2018-09-30 DIAGNOSIS — R55 Syncope and collapse: Secondary | ICD-10-CM | POA: Insufficient documentation

## 2018-09-30 LAB — BASIC METABOLIC PANEL
Anion gap: 8 (ref 5–15)
BUN: 17 mg/dL (ref 8–23)
CALCIUM: 9.1 mg/dL (ref 8.9–10.3)
CHLORIDE: 105 mmol/L (ref 98–111)
CO2: 28 mmol/L (ref 22–32)
Creatinine, Ser: 0.93 mg/dL (ref 0.44–1.00)
GFR calc non Af Amer: >60 mL/min (ref >60)
Glucose, Bld: 103 mg/dL — ABNORMAL HIGH (ref 70–99)
Potassium: 3.8 mmol/L (ref 3.5–5.1)
SODIUM: 141 mmol/L (ref 135–145)

## 2018-09-30 LAB — CBC
HEMATOCRIT: 35.4 % — AB (ref 36.0–46.0)
Hemoglobin: 12.8 g/dL (ref 12.0–15.0)
MCH: 29.6 pg (ref 26.0–34.0)
MCHC: 36.2 g/dL — ABNORMAL HIGH (ref 30.0–36.0)
MCV: 81.9 fL (ref 80.0–100.0)
NRBC: 0 % (ref 0.0–0.2)
PLATELETS: 261 10*3/uL (ref 150–400)
RBC: 4.32 MIL/uL (ref 3.87–5.11)
RDW: 13.2 % (ref 11.5–15.5)
WBC: 3.9 10*3/uL — ABNORMAL LOW (ref 4.0–10.5)

## 2018-09-30 NOTE — ED Triage Notes (Addendum)
Per EMS, pt from home. Pt was writing her grocery list when she stood up and then became dizzy and fell down. Pt fell under a desk then when she tried to get up she hit her head on the corner of the entertainment center next to the desk. Reports some neck pain with a hematoma to back of head, tender to touch. Towel roll collar applied by ems. Pt denies blood thinners, hx of stroke/tia. No new neuro deficits, baseline weakness to rt side. Ambulatory with walker, A&Ox4. Also reports increased swelling to bilateral ankles for the last two days with orthopnea.   EMS VS BP 130/84, P 60, 99% room air. CBG 126. EKG unremarkable.

## 2018-10-01 ENCOUNTER — Emergency Department (HOSPITAL_COMMUNITY): Payer: Medicare Other

## 2018-10-01 LAB — URINALYSIS, ROUTINE W REFLEX MICROSCOPIC
Bilirubin Urine: NEGATIVE
Glucose, UA: NEGATIVE mg/dL
Hgb urine dipstick: NEGATIVE
KETONES UR: NEGATIVE mg/dL
LEUKOCYTES UA: NEGATIVE
NITRITE: NEGATIVE
Protein, ur: NEGATIVE mg/dL
Specific Gravity, Urine: 1.024 (ref 1.005–1.030)
pH: 5 (ref 5.0–8.0)

## 2018-10-01 LAB — BRAIN NATRIURETIC PEPTIDE: B Natriuretic Peptide: 40.9 pg/mL (ref 0.0–100.0)

## 2018-10-01 MED ORDER — AMLODIPINE BESYLATE 5 MG PO TABS
5.0000 mg | ORAL_TABLET | Freq: Once | ORAL | Status: AC
  Administered 2018-10-01: 5 mg via ORAL
  Filled 2018-10-01: qty 1

## 2018-10-01 MED ORDER — TRAMADOL HCL 50 MG PO TABS
50.0000 mg | ORAL_TABLET | Freq: Once | ORAL | Status: AC
  Administered 2018-10-01: 50 mg via ORAL
  Filled 2018-10-01: qty 1

## 2018-10-01 MED ORDER — CLONIDINE HCL 0.2 MG PO TABS
0.3000 mg | ORAL_TABLET | Freq: Once | ORAL | Status: AC
  Administered 2018-10-01: 0.3 mg via ORAL
  Filled 2018-10-01: qty 1

## 2018-10-01 MED ORDER — ACETAMINOPHEN 325 MG PO TABS
650.0000 mg | ORAL_TABLET | Freq: Once | ORAL | Status: AC
  Administered 2018-10-01: 650 mg via ORAL
  Filled 2018-10-01: qty 2

## 2018-10-01 NOTE — ED Provider Notes (Signed)
[facility] EMERGENCY DEPARTMENT Provider Note   CSN: [id number] Arrival date & time: 09/30/18  2104     History   Chief Complaint Chief Complaint  Patient presents with  . Fall    HPI Harvest Bailey [name] is a 63 y.o. female.  Patient presents to the emergency department for evaluation after a fall.  Patient reports that she was sitting at her computer for a while, stood up and became dizzy.  She reports that she felt like she was going to pass out and then the next thing she knew she was on the ground under her table.  She hit the back of her head when she fell.  Patient reporting posterior headache and neck pain upon arrival to the ER.  She had not experienced any chest pain, heart palpitations, shortness of breath prior to the episode.  Patient does, however, report that she has noticed swelling of her ankles over the last couple of days.  She thinks that she gets short of breath when she lies flat.     Past Medical History:  Diagnosis Date  . Anxiety    "Breakdown" in the 1990's, hosp. for >1 wk. at [facility]    . Anxiety   . Arthritis    OA- R knee, L hip   . Asthma    years ago. "no problems in a long while."  . Constipation   . Depression   . GERD (gastroesophageal reflux disease)   . Goiter    removed  at age 63  . Hypertension    all BP meds. managed by Dr. Eula ListenHussain    . Hypertension   . Incontinence of urine    weak bladder  . Pneumonia    while at [facility] told that she had pneumonia & was isolated   . Stroke (HCC)    2005, R sided weakness   . Stroke [facility](HCC) 2005   ? weakness in hand and arms per mother. patient unsure    Patient Active Problem List   Diagnosis Date Noted  . 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 Mild cognitive impairment 03/11/2017  . Syncopal episodes 05/16/2015  . Acute renal injury (HCC) 05/16/2015  . Elevated troponin 05/16/2015  . Hypertension 05/16/2015  . Non compliance w medication regimen 05/16/2015  .  Pulmonary nodule 05/16/2015  . Pressure ulcer 02/28/2015  . Dehydration 02/26/2015  . Intractable vomiting with nausea 02/26/2015  . Hypokalemia 02/25/2015  . Abdominal pain   . Primary osteoarthritis of right hip 02/15/2015  . Severe recurrent major depressive disorder with psychotic features (HCC) 09/01/2014  . GAD (generalized anxiety disorder) 09/01/2014  . PTSD (post-traumatic stress disorder) 09/01/2014  . Hip arthritis left 12/23/2011  . 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 ABDOMINAL HYSTERECTOMY    . COLONOSCOPY    . DILATION AND CURETTAGE OF UTERUS     1980's  . goiter     removed age 89  . JOINT REPLACEMENT  10/2011   Rt knee  . JOINT REPLACEMENT Right    knee  . THYROIDECTOMY, PARTIAL    . TONSILLECTOMY    . TOTAL HIP ARTHROPLASTY  12/23/2011   Procedure: TOTAL HIP ARTHROPLASTY;  Surgeon: Nestor LewandowskyFrank J Rowan, MD;  Location: [facility] OR;  Service: Orthopedics;  Laterality: Left;  DEPUY/PINNACLE CUPS/SROM STEM  . TOTAL HIP ARTHROPLASTY Left   . TOTAL HIP ARTHROPLASTY Right 02/15/2015   Procedure: TOTAL HIP ARTHROPLASTY;  Surgeon: Gean Birchwood, MD;  Location: [facility] OR;  Service: Orthopedics;  Laterality: Right;  . TOTAL KNEE ARTHROPLASTY  10/21/2011   Procedure: TOTAL KNEE ARTHROPLASTY;  Surgeon: Nestor Lewandowsky, MD;  Location: [facility] OR;  Service: Orthopedics;  Laterality: Right;  DEPUY SIGMA RP  . TUBAL LIGATION       OB History    Gravida  0   Para  0   Term  0   Preterm  0   AB  0   Living        SAB  0   TAB  0   Ectopic  0   Multiple      Live Births               Home Medications    Prior to Admission medications   Medication Sig Start Date End Date Taking? Authorizing Provider  acetaminophen (TYLENOL) 325 MG tablet Take 2 tablets (650 mg total) by mouth every 6 (six) hours as needed for mild pain (or Fever >/= 101). 05/18/15   Esperanza Sheets, MD  amLODipine (NORVASC) 2.5 MG  tablet Take 2.5 mg by mouth daily.    [provider]  aspirin EC 325 MG tablet Take 1 tablet (325 mg total) by mouth 2 (two) times daily. 02/17/15   Allena Katz, PA-C  Cholecalciferol (VITAMIN D) 2000 UNITS tablet Take 2,000 Units by mouth daily.    [provider]  citalopram (CELEXA) 40 MG tablet Take 40 mg by mouth daily.     [provider]  clonazePAM (KLONOPIN) 0.5 MG tablet Take 0.5 mg by mouth 2 (two) times daily.    [provider]  cloNIDine (CATAPRES) 0.3 MG tablet Take 0.3 mg by mouth 2 (two) times daily.    [provider]  Cyanocobalamin (VITAMIN B 12 PO) Take by mouth.    [provider]  doxazosin (CARDURA) 8 MG tablet Take 8 mg by mouth at bedtime.    [provider]  Flaxseed, Linseed, (FLAX SEED OIL PO) Take 1,200 mg by mouth daily.    [provider]  losartan (COZAAR) 100 MG tablet Take 100 mg by mouth daily.    [provider]  metoprolol (TOPROL-XL) 100 MG 24 hr tablet Take 100 mg by mouth daily.     [provider]  omeprazole (PRILOSEC) 20 MG capsule Take 40 mg by mouth daily.    [provider]  potassium chloride (KLOR-CON) 20 MEQ packet Take by mouth 2 (two) times daily.    [provider]  QUEtiapine (SEROQUEL) 50 MG tablet Take 1 tablet (50 mg total) by mouth at bedtime. 09/01/14 03/11/17  Oletta Darter, MD  vitamin C (ASCORBIC ACID) 500 MG tablet Take 500 mg by mouth daily.    [provider]    Family History Family History  Problem Relation Age of Onset  . Anesthesia problems Neg Hx   . Hypotension Neg Hx   . Malignant hyperthermia Neg Hx   . Pseudochol deficiency Neg Hx   . Alcohol abuse Neg Hx   . Anxiety disorder Neg Hx   . Bipolar disorder Neg Hx   . Depression Neg Hx   . Dementia Neg Hx   . Breast cancer Neg Hx     Social History Social History   Tobacco Use  . Smoking status: Never Smoker  . Smokeless tobacco: Never Used    Substance Use Topics  . Alcohol use: Yes    Comment:  2x's /month, socially    . Drug use: No    Types: Marijuana    Comment: irregular use, sometimes 2 x's week, then not for another month , last use November 2015     Allergies   Codeine; Sulfa antibiotics; and Sulfa drugs cross reactors   Review of Systems Review of Systems  Cardiovascular: Positive for leg swelling.  Musculoskeletal: Positive for neck pain.  Neurological: Positive for dizziness and headaches.  All other systems reviewed and are negative.    Physical Exam Updated Vital Signs BP (!) 144/101   Pulse 60   Temp (!) 97.5 F (36.4 C) (Oral)   Resp 16   Ht 5\' 1"  (1.549 m)   Wt 90.7 kg   SpO2 97%   BMI 37.79 kg/m   Physical Exam Vitals signs and nursing note reviewed.  Constitutional:      General: She is not in acute distress.    Appearance: Normal appearance. She is well-developed.  HENT:     Head: Normocephalic. Contusion present.      Right Ear: Hearing normal.     Left Ear: Hearing normal.     Nose: Nose normal.  Eyes:     Conjunctiva/sclera: Conjunctivae normal.     Pupils: Pupils are equal, round, and reactive to light.  Neck:     Musculoskeletal: Normal range of motion and neck supple. Muscular tenderness present.   Cardiovascular:     Rate and Rhythm: Regular rhythm.     Heart sounds: S1 normal and S2 normal. No murmur. No friction rub. No gallop.   Pulmonary:     Effort: Pulmonary effort is normal. No respiratory distress.     Breath sounds: Normal breath sounds.  Chest:     Chest wall: No tenderness.  Abdominal:     General: Bowel sounds are normal.     Palpations: Abdomen is soft.     Tenderness: There is no abdominal tenderness. There is no guarding or rebound. Negative signs include Murphy's sign and McBurney's sign.     Hernia: A hernia (Left mid abdominal wall, tender but reducible) is present.  Musculoskeletal: Normal range of motion.     Right lower leg: 1+ Pitting Edema  present.     Left lower leg: 1+ Pitting Edema present.  Skin:    General: Skin is warm and dry.     Findings: No rash.  Neurological:     Mental Status: She is alert and oriented to person, place, and time.     GCS: GCS eye subscore is 4. GCS verbal subscore is 5. GCS motor subscore is 6.     Cranial Nerves: No cranial nerve deficit.     Sensory: No sensory deficit.     Coordination: Coordination normal.  Psychiatric:        Speech: Speech normal.        Behavior: Behavior normal.        Thought Content: Thought content normal.      ED Treatments / Results  Labs (all labs ordered are listed, but only abnormal results are displayed) Labs Reviewed  BASIC METABOLIC PANEL - Abnormal; Notable for the following components:      Result Value   Glucose, Bld 103 (*)    All other components within normal limits  CBC - Abnormal; Notable for the following components:   WBC 3.9 (*)    HCT 35.4 (*)    MCHC 36.2 (*)    All other components within normal limits  URINALYSIS, ROUTINE W REFLEX MICROSCOPIC  BRAIN NATRIURETIC PEPTIDE  CBG MONITORING, ED    EKG EKG Interpretation  Date/Time:  Wednesday September 30 2018 21:13:50 EST Ventricular Rate:  61 PR Interval:  166 QRS Duration: 102 QT Interval:  446 QTC Calculation: 448 R Axis:   -37 Text Interpretation:  Normal sinus rhythm Left axis deviation Left ventricular hypertrophy Abnormal ECG No significant change since last tracing Confirmed by Gilda Crease [phone]) on 10/01/2018 12:55:39 AM Also confirmed by Gilda Crease [phone]), editor Barbette Hair [phone])  on 10/01/2018 7:10:44 AM   Radiology Dg Chest 2 View  Result Date: 10/01/2018 CLINICAL DATA:  Acute onset of dizziness and fall. Concern for chest injury. Initial encounter. EXAM: CHEST - 2 VIEW COMPARISON:  Chest radiograph performed 05/16/2015, and CT of the chest 02/22/2016 FINDINGS: The lungs are well-aerated and clear. There is no evidence of focal  opacification, pleural effusion or pneumothorax. The heart is borderline enlarged. No acute osseous abnormalities are seen. IMPRESSION: Borderline cardiomegaly. Lungs remain grossly clear. No displaced rib fracture seen. Electronically Signed   By: Roanna Raider M.D.   On: 10/01/2018 00:54   Ct Head Wo Contrast  Result Date: 09/30/2018 CLINICAL DATA:  Larey Seat and hit back of head, pain EXAM: CT HEAD WITHOUT CONTRAST CT CERVICAL SPINE WITHOUT CONTRAST TECHNIQUE: Multidetector CT imaging of the head and cervical spine was performed following the standard protocol without intravenous contrast. Multiplanar CT image reconstructions of the cervical spine were also generated. COMPARISON:  MRI 04/01/2017, CT 01/30/2017, 05/16/2015 FINDINGS: CT HEAD FINDINGS Brain: No acute territorial infarction, hemorrhage or intracranial mass. Multifocal chronic lacunar infarcts in the basal ganglia, left thalamus, and bilateral white matter. Mild atrophy. Fairly extensive small vessel ischemic changes of the white matter. Stable ventricle size. Vascular: No hyperdense vessels.  Carotid vascular calcification Skull: Normal. Negative for fracture or focal lesion. Sinuses/Orbits: No acute finding. Other: None CT CERVICAL SPINE FINDINGS Alignment: Reversal of cervical lordosis. No subluxation. Facet alignment is within normal limits. Skull base and vertebrae: No acute fracture. No primary bone lesion or focal pathologic process. Soft tissues and spinal canal: No prevertebral fluid or swelling. No visible canal hematoma. Disc levels: Moderate degenerative changes C4 through C6. Mild degenerative change at C3-C4 and C6-C7. Upper chest: Enlarged multinodular right lobe of thyroid. Status post left lobectomy of thyroid. Other: None IMPRESSION: 1. No CT evidence for acute intracranial abnormality. Atrophy, small vessel ischemic changes of the white matter and multifocal chronic lacunar infarcts 2. Reversal of cervical lordosis with degenerative  change. No acute osseous abnormality 3. Enlarged multinodular right lobe of thyroid Electronically Signed   By: Jasmine Pang M.D.   On: 09/30/2018 22:25   Ct Cervical Spine Wo Contrast  Result Date: 09/30/2018 CLINICAL DATA:  Larey Seat and hit back of head, pain EXAM: CT HEAD WITHOUT CONTRAST CT CERVICAL SPINE WITHOUT CONTRAST TECHNIQUE: Multidetector CT imaging of the head and cervical spine was performed following the standard protocol without intravenous contrast. Multiplanar CT image reconstructions of the cervical spine were also generated. COMPARISON:  MRI 04/01/2017, CT 01/30/2017, 05/16/2015 FINDINGS: CT HEAD FINDINGS Brain: No acute territorial infarction, hemorrhage or intracranial mass. Multifocal chronic lacunar infarcts in the basal ganglia, left thalamus, and bilateral white matter. Mild atrophy. Fairly extensive small vessel ischemic changes of the white matter. Stable ventricle size. Vascular: No hyperdense vessels.  Carotid vascular calcification Skull: Normal. Negative for fracture or focal lesion. Sinuses/Orbits: No acute finding. Other: None CT CERVICAL SPINE FINDINGS Alignment: Reversal of  cervical lordosis. No subluxation. Facet alignment is within normal limits. Skull base and vertebrae: No acute fracture. No primary bone lesion or focal pathologic process. Soft tissues and spinal canal: No prevertebral fluid or swelling. No visible canal hematoma. Disc levels: Moderate degenerative changes C4 through C6. Mild degenerative change at C3-C4 and C6-C7. Upper chest: Enlarged multinodular right lobe of thyroid. Status post left lobectomy of thyroid. Other: None IMPRESSION: 1. No CT evidence for acute intracranial abnormality. Atrophy, small vessel ischemic changes of the white matter and multifocal chronic lacunar infarcts 2. Reversal of cervical lordosis with degenerative change. No acute osseous abnormality 3. Enlarged multinodular right lobe of thyroid Electronically Signed   By: Jasmine Pang  M.D.   On: 09/30/2018 22:25    Procedures Procedures (including critical care time)  Medications Ordered in ED Medications  amLODipine (NORVASC) tablet 5 mg (5 mg Oral Given 10/01/18 0107)  cloNIDine (CATAPRES) tablet 0.3 mg (0.3 mg Oral Given 10/01/18 0107)  traMADol (ULTRAM) tablet 50 mg (50 mg Oral Given 10/01/18 0108)  acetaminophen (TYLENOL) tablet 650 mg (650 mg Oral Given 10/01/18 0107)     Initial Impression / Assessment and Plan / ED Course  I have reviewed the triage vital signs and the nursing notes.  Pertinent labs & imaging results that were available during my care of the patient were reviewed by me and considered in my medical decision making (see chart for details).     Patient presents to the emergency department for evaluation after syncope or near syncope resulting in a fall earlier tonight.  She had been sitting at her computer for a while, stood up and became dizzy, then fell.  It is not clear if she lost consciousness, she remembers falling and being under the table after the fall.  She was able to get up on her own.  She hit the back of her head, complains of tenderness to the scalp, posterior headache and neck pain.  Head CT and neck CT were negative for acute injury.  She has normal neurologic function currently.  Felt to be low risk group for syncope by [facility] syncope rule.  Patient also complaining of ankle swelling.  She does have slight pitting edema both lower extremities.  No history of heart failure.  No sign of heart failure today, EKG does not show any volume overload.  BNP is normal.  Remainder of work-up unremarkable.  Patient will be appropriate for discharge, prompt follow-up with PCP.  [place] Syncope Rule from [email or web address]  on 10/01/2018 ** All calculations should be rechecked by clinician prior to use **  RESULT SUMMARY:     Patient IS in the low-risk group for serious outcome.   INPUTS: Congestive heart failure history -> 0 =  No Hematocrit  -> 0 = No EKG abnormal (EKG changed, or any non-sinus rhythm on EKG or monitoring) -> 0 = No Shortness of breath symptoms -> 0 = No Systolic BP  -> 0 = No   Final Clinical Impressions(s) / ED Diagnoses   Final diagnoses:  Minor head injury, initial encounter  Syncope, unspecified syncope type  Peripheral edema    ED Discharge Orders    None       Gilda Crease, MD 10/01/18 0800

## 2018-10-01 NOTE — ED Notes (Signed)
Pt stable and wheeled out for discharge, states understanding follow up.  

## 2019-02-19 ENCOUNTER — Other Ambulatory Visit: Payer: Self-pay | Admitting: Internal Medicine

## 2019-02-19 DIAGNOSIS — R519 Headache, unspecified: Secondary | ICD-10-CM

## 2019-02-26 ENCOUNTER — Ambulatory Visit
Admission: RE | Admit: 2019-02-26 | Discharge: 2019-02-26 | Disposition: A | Discharge status: Home / Self Care | Payer: Medicare Other | Source: Ambulatory Visit | Attending: Internal Medicine | Admitting: Internal Medicine

## 2019-02-26 DIAGNOSIS — R519 Headache, unspecified: Secondary | ICD-10-CM

## 2019-04-12 ENCOUNTER — Other Ambulatory Visit: Payer: Self-pay | Admitting: Internal Medicine

## 2019-04-12 DIAGNOSIS — Z1231 Encounter for screening mammogram for malignant neoplasm of breast: Secondary | ICD-10-CM

## 2019-05-27 ENCOUNTER — Other Ambulatory Visit: Payer: Self-pay

## 2019-05-27 ENCOUNTER — Ambulatory Visit
Admission: RE | Admit: 2019-05-27 | Discharge: 2019-05-27 | Disposition: A | Discharge status: Home / Self Care | Payer: Medicare Other | Source: Ambulatory Visit | Attending: Internal Medicine | Admitting: Internal Medicine

## 2019-05-27 DIAGNOSIS — Z1231 Encounter for screening mammogram for malignant neoplasm of breast: Secondary | ICD-10-CM

## 2019-06-08 ENCOUNTER — Other Ambulatory Visit: Payer: Self-pay | Admitting: Internal Medicine

## 2019-06-08 DIAGNOSIS — R109 Unspecified abdominal pain: Secondary | ICD-10-CM

## 2019-06-08 DIAGNOSIS — K439 Ventral hernia without obstruction or gangrene: Secondary | ICD-10-CM

## 2019-06-15 ENCOUNTER — Ambulatory Visit
Admission: RE | Admit: 2019-06-15 | Discharge: 2019-06-15 | Disposition: A | Discharge status: Home / Self Care | Payer: Medicare Other | Source: Ambulatory Visit | Attending: Internal Medicine | Admitting: Internal Medicine

## 2019-06-15 DIAGNOSIS — K439 Ventral hernia without obstruction or gangrene: Secondary | ICD-10-CM

## 2019-06-15 DIAGNOSIS — R109 Unspecified abdominal pain: Secondary | ICD-10-CM

## 2019-09-14 ENCOUNTER — Other Ambulatory Visit: Payer: Self-pay

## 2019-09-14 ENCOUNTER — Inpatient Hospital Stay (HOSPITAL_COMMUNITY)
Admission: EM | Admit: 2019-09-14 | Discharge: 2019-09-17 | DRG: 065 | Disposition: A | Discharge status: Skilled Nursing Facility | Payer: Medicare Other | Attending: Family Medicine | Admitting: Family Medicine

## 2019-09-14 ENCOUNTER — Emergency Department (HOSPITAL_COMMUNITY): Payer: Medicare Other

## 2019-09-14 DIAGNOSIS — I69351 Hemiplegia and hemiparesis following cerebral infarction affecting right dominant side: Secondary | ICD-10-CM

## 2019-09-14 DIAGNOSIS — I10 Essential (primary) hypertension: Secondary | ICD-10-CM | POA: Diagnosis present

## 2019-09-14 DIAGNOSIS — R471 Dysarthria and anarthria: Secondary | ICD-10-CM | POA: Diagnosis present

## 2019-09-14 DIAGNOSIS — E785 Hyperlipidemia, unspecified: Secondary | ICD-10-CM | POA: Diagnosis present

## 2019-09-14 DIAGNOSIS — R531 Weakness: Secondary | ICD-10-CM

## 2019-09-14 DIAGNOSIS — Z96643 Presence of artificial hip joint, bilateral: Secondary | ICD-10-CM | POA: Diagnosis present

## 2019-09-14 DIAGNOSIS — I639 Cerebral infarction, unspecified: Secondary | ICD-10-CM

## 2019-09-14 DIAGNOSIS — F329 Major depressive disorder, single episode, unspecified: Secondary | ICD-10-CM | POA: Diagnosis present

## 2019-09-14 DIAGNOSIS — E876 Hypokalemia: Secondary | ICD-10-CM | POA: Diagnosis present

## 2019-09-14 DIAGNOSIS — I6381 Other cerebral infarction due to occlusion or stenosis of small artery: Principal | ICD-10-CM | POA: Diagnosis present

## 2019-09-14 DIAGNOSIS — R29706 NIHSS score 6: Secondary | ICD-10-CM | POA: Diagnosis present

## 2019-09-14 DIAGNOSIS — R29810 Facial weakness: Secondary | ICD-10-CM | POA: Diagnosis present

## 2019-09-14 DIAGNOSIS — Z885 Allergy status to narcotic agent status: Secondary | ICD-10-CM

## 2019-09-14 DIAGNOSIS — F411 Generalized anxiety disorder: Secondary | ICD-10-CM | POA: Diagnosis present

## 2019-09-14 DIAGNOSIS — Z96651 Presence of right artificial knee joint: Secondary | ICD-10-CM | POA: Diagnosis present

## 2019-09-14 DIAGNOSIS — F4310 Post-traumatic stress disorder, unspecified: Secondary | ICD-10-CM | POA: Diagnosis present

## 2019-09-14 DIAGNOSIS — Z882 Allergy status to sulfonamides status: Secondary | ICD-10-CM

## 2019-09-14 DIAGNOSIS — F32A Depression, unspecified: Secondary | ICD-10-CM

## 2019-09-14 DIAGNOSIS — M1990 Unspecified osteoarthritis, unspecified site: Secondary | ICD-10-CM | POA: Diagnosis present

## 2019-09-14 DIAGNOSIS — R29818 Other symptoms and signs involving the nervous system: Secondary | ICD-10-CM | POA: Diagnosis not present

## 2019-09-14 DIAGNOSIS — Z20828 Contact with and (suspected) exposure to other viral communicable diseases: Secondary | ICD-10-CM | POA: Diagnosis present

## 2019-09-14 DIAGNOSIS — Z7982 Long term (current) use of aspirin: Secondary | ICD-10-CM

## 2019-09-14 DIAGNOSIS — K219 Gastro-esophageal reflux disease without esophagitis: Secondary | ICD-10-CM | POA: Diagnosis present

## 2019-09-14 LAB — COMPREHENSIVE METABOLIC PANEL
ALT: 15 U/L (ref 0–44)
AST: 17 U/L (ref 15–41)
Albumin: 3.5 g/dL (ref 3.5–5.0)
Alkaline Phosphatase: 95 U/L (ref 38–126)
Anion gap: 11 (ref 5–15)
BUN: 15 mg/dL (ref 8–23)
CO2: 30 mmol/L (ref 22–32)
Calcium: 8.6 mg/dL — ABNORMAL LOW (ref 8.9–10.3)
Chloride: 102 mmol/L (ref 98–111)
Creatinine, Ser: 0.94 mg/dL (ref 0.44–1.00)
GFR calc Af Amer: >60 mL/min (ref >60)
GFR calc non Af Amer: >60 mL/min (ref >60)
Glucose, Bld: 96 mg/dL (ref 70–99)
Potassium: 3 mmol/L — ABNORMAL LOW (ref 3.5–5.1)
Sodium: 143 mmol/L (ref 135–145)
Total Bilirubin: 1.2 mg/dL (ref 0.3–1.2)
Total Protein: 6.8 g/dL (ref 6.5–8.1)

## 2019-09-14 LAB — DIFFERENTIAL
Abs Immature Granulocytes: 0.01 10*3/uL (ref 0.00–0.07)
Basophils Absolute: 0 10*3/uL (ref 0.0–0.1)
Basophils Relative: 1 %
Eosinophils Absolute: 0.1 10*3/uL (ref 0.0–0.5)
Eosinophils Relative: 4 %
Immature Granulocytes: 0 %
Lymphocytes Relative: 29 %
Lymphs Abs: 1.2 10*3/uL (ref 0.7–4.0)
Monocytes Absolute: 0.4 10*3/uL (ref 0.1–1.0)
Monocytes Relative: 11 %
Neutro Abs: 2.2 10*3/uL (ref 1.7–7.7)
Neutrophils Relative %: 55 %

## 2019-09-14 LAB — CBC
HCT: 35.5 % — ABNORMAL LOW (ref 36.0–46.0)
Hemoglobin: 12.6 g/dL (ref 12.0–15.0)
MCH: 29.3 pg (ref 26.0–34.0)
MCHC: 35.5 g/dL (ref 30.0–36.0)
MCV: 82.6 fL (ref 80.0–100.0)
Platelets: 266 10*3/uL (ref 150–400)
RBC: 4.3 MIL/uL (ref 3.87–5.11)
RDW: 13.5 % (ref 11.5–15.5)
WBC: 3.9 10*3/uL — ABNORMAL LOW (ref 4.0–10.5)
nRBC: 0 % (ref 0.0–0.2)

## 2019-09-14 LAB — PROTIME-INR
INR: 1 (ref 0.8–1.2)
Prothrombin Time: 13.5 seconds (ref 11.4–15.2)

## 2019-09-14 LAB — APTT: aPTT: 29 seconds (ref 24–36)

## 2019-09-14 MED ORDER — LORAZEPAM 2 MG/ML IJ SOLN
1.0000 mg | Freq: Once | INTRAMUSCULAR | Status: AC
  Administered 2019-09-14: 1 mg via INTRAVENOUS
  Filled 2019-09-14: qty 1

## 2019-09-14 MED ORDER — LORAZEPAM 2 MG/ML IJ SOLN: 1.0000 mg | Freq: Once | INTRAMUSCULAR | Status: DC | PRN

## 2019-09-14 MED ORDER — SODIUM CHLORIDE 0.9% FLUSH: 3.0000 mL | Freq: Once | INTRAVENOUS | Status: DC

## 2019-09-14 MED ORDER — POTASSIUM CHLORIDE CRYS ER 20 MEQ PO TBCR
40.0000 meq | EXTENDED_RELEASE_TABLET | Freq: Once | ORAL | Status: AC
  Administered 2019-09-14: 40 meq via ORAL
  Filled 2019-09-14: qty 2

## 2019-09-14 MED ORDER — MORPHINE SULFATE (PF) 4 MG/ML IV SOLN: 4.0000 mg | Freq: Once | INTRAVENOUS | Status: DC | PRN

## 2019-09-14 MED ORDER — MORPHINE SULFATE (PF) 4 MG/ML IV SOLN
4.0000 mg | Freq: Once | INTRAVENOUS | Status: AC
  Administered 2019-09-14: 4 mg via INTRAVENOUS
  Filled 2019-09-14: qty 1

## 2019-09-14 MED ORDER — ASPIRIN 81 MG PO CHEW
324.0000 mg | CHEWABLE_TABLET | Freq: Once | ORAL | Status: AC
  Administered 2019-09-14: 324 mg via ORAL
  Filled 2019-09-14: qty 4

## 2019-09-14 MED ORDER — ONDANSETRON HCL 4 MG/2ML IJ SOLN
4.0000 mg | Freq: Once | INTRAMUSCULAR | Status: AC
  Administered 2019-09-14: 4 mg via INTRAVENOUS
  Filled 2019-09-14: qty 2

## 2019-09-14 NOTE — ED Notes (Signed)
Sister and Mother of pt is waiting outside. Pts sisters number is [phone]

## 2019-09-14 NOTE — ED Triage Notes (Signed)
Pt arrives via POV from home with new onset slurred speech right arm and leg weakness. Pt reports she dizzy. Hx of strokes in the past. LKW Monday. VSS.

## 2019-09-14 NOTE — ED Notes (Signed)
Pt brought back from MRI due to her stating that she could not lay flat because it would cause her leg to have spasms

## 2019-09-14 NOTE — ED Provider Notes (Signed)
[facility] EMERGENCY DEPARTMENT Provider Note   CSN: [id number] Arrival date & time: 09/14/19  1054     History Chief Complaint  Patient presents with  . Extremity Weakness  . Aphasia    Bailey [name] is a 63 y.o. female.  HPI   Patient is a 63 year old female with a history of anxiety, arthritis, asthma, constipation, depression, GERD, hypertension, CVA with residual right-sided weakness, who presents the emergency department today for evaluation of weakness.  Patient reports that she has a history of chronic right-sided deficits from prior CVA however she woke up yesterday morning at 9am and noticed worsening of her right sided weakness. LNW 09/12/19 when she went to bed.  Her family also noted that she has new right-sided facial droop and slurred speech which she does not have at baseline.  Patient denies any recent falls or trauma.  She is not on any blood thinners.  She denies any chest pain, shortness of breath, fevers, nausea vomiting or diarrhea.  She has had some abdominal pain for several months due to a hernia which she states is not worse today.  She otherwise has been feeling well.  Past Medical History:  Diagnosis Date  . Anxiety    "Breakdown" in the 1990's, hosp. for >1 wk. at [facility]    . Anxiety   . Arthritis    OA- R knee, L hip   . Asthma    years ago. "no problems in a long while."  . Constipation   . Depression   . GERD (gastroesophageal reflux disease)   . Goiter    removed  at age 63  . Hypertension    all BP meds. managed by Dr. Eula Listen    . Hypertension   . Incontinence of urine    weak bladder  . Pneumonia    while at [facility].- 1990's told that she had pneumonia & was isolated   . Stroke (HCC)    2005, R sided weakness   . Stroke [facility]) 2005   ? weakness in hand and arms per mother. patient unsure    Patient Active Problem List   Diagnosis Date Noted  . 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 Mild cognitive impairment  03/11/2017  . Syncopal episodes 05/16/2015  . Acute renal injury (HCC) 05/16/2015  . Elevated troponin 05/16/2015  . Hypertension 05/16/2015  . Non compliance w medication regimen 05/16/2015  . Pulmonary nodule 05/16/2015  . Pressure ulcer 02/28/2015  . Dehydration 02/26/2015  . Intractable vomiting with nausea 02/26/2015  . Hypokalemia 02/25/2015  . Abdominal pain   . Primary osteoarthritis of right hip 02/15/2015  . Severe recurrent major depressive disorder with psychotic features (HCC) 09/01/2014  . GAD (generalized anxiety disorder) 09/01/2014  . PTSD (post-traumatic stress disorder) 09/01/2014  . Hip arthritis left 12/23/2011  . 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 ABDOMINAL HYSTERECTOMY    . COLONOSCOPY    . DILATION AND CURETTAGE OF UTERUS     1980's  . goiter     removed age 12  . JOINT REPLACEMENT  10/2011   Rt knee  . JOINT REPLACEMENT Right    knee  . THYROIDECTOMY, PARTIAL    . TONSILLECTOMY    . TOTAL HIP ARTHROPLASTY  12/23/2011   Procedure: TOTAL HIP ARTHROPLASTY;  Surgeon: Nestor Lewandowsky, MD;  Location: [facility] OR;  Service: Orthopedics;  Laterality: Left;  DEPUY/PINNACLE CUPS/SROM STEM  .  TOTAL HIP ARTHROPLASTY Left   . TOTAL HIP ARTHROPLASTY Right 02/15/2015   Procedure: TOTAL HIP ARTHROPLASTY;  Surgeon: Gean Birchwood, MD;  Location: [facility] OR;  Service: Orthopedics;  Laterality: Right;  . TOTAL KNEE ARTHROPLASTY  10/21/2011   Procedure: TOTAL KNEE ARTHROPLASTY;  Surgeon: Nestor Lewandowsky, MD;  Location: [facility] OR;  Service: Orthopedics;  Laterality: Right;  DEPUY SIGMA RP  . TUBAL LIGATION       OB History    Gravida  0   Para  0   Term  0   Preterm  0   AB  0   Living        SAB  0   TAB  0   Ectopic  0   Multiple      Live Births              Family History  Problem Relation Age of Onset  . Anesthesia problems Neg Hx   . Hypotension Neg Hx   . Malignant hyperthermia  Neg Hx   . Pseudochol deficiency Neg Hx   . Alcohol abuse Neg Hx   . Anxiety disorder Neg Hx   . Bipolar disorder Neg Hx   . Depression Neg Hx   . Dementia Neg Hx   . Breast cancer Neg Hx     Social History   Tobacco Use  . Smoking status: Never Smoker  . Smokeless tobacco: Never Used  Substance Use Topics  . Alcohol use: Yes    Comment: 2x's /month, socially    . Drug use: No    Types: Marijuana    Comment: irregular use, sometimes 2 x's week, then not for another month , last use November 2015    Home Medications Prior to Admission medications   Medication Sig Start Date End Date Taking? Authorizing Provider  acetaminophen (TYLENOL) 325 MG tablet Take 2 tablets (650 mg total) by mouth every 6 (six) hours as needed for mild pain (or Fever >/= 101). 05/18/15   Esperanza Sheets, MD  amLODipine (NORVASC) 2.5 MG tablet Take 2.5 mg by mouth daily.    [provider]  aspirin EC 325 MG tablet Take 1 tablet (325 mg total) by mouth 2 (two) times daily. 02/17/15   Allena Katz, PA-C  Cholecalciferol (VITAMIN D) 2000 UNITS tablet Take 2,000 Units by mouth daily.    [provider]  citalopram (CELEXA) 40 MG tablet Take 40 mg by mouth daily.     [provider]  clonazePAM (KLONOPIN) 0.5 MG tablet Take 0.5 mg by mouth 2 (two) times daily.    [provider]  cloNIDine (CATAPRES) 0.3 MG tablet Take 0.3 mg by mouth 2 (two) times daily.    [provider]  Cyanocobalamin (VITAMIN B 12 PO) Take by mouth.    [provider]  doxazosin (CARDURA) 8 MG tablet Take 8 mg by mouth at bedtime.    [provider]  Flaxseed, Linseed, (FLAX SEED OIL PO) Take 1,200 mg by mouth daily.    [provider]  losartan (COZAAR) 100 MG tablet Take 100 mg by mouth daily.    [provider]  metoprolol (TOPROL-XL) 100 MG 24 hr tablet Take 100 mg by mouth daily.     [provider]  omeprazole (PRILOSEC) 20 MG capsule Take  40 mg by mouth daily.    [provider]  potassium chloride (KLOR-CON) 20 MEQ packet Take by mouth 2 (two) times daily.  [provider]  QUEtiapine (SEROQUEL) 50 MG tablet Take 1 tablet (50 mg total) by mouth at bedtime. 09/01/14 03/11/17  Oletta DarterAgarwal, [place], MD  vitamin C (ASCORBIC ACID) 500 MG tablet Take 500 mg by mouth daily.    [provider]    Allergies    Codeine, Sulfa antibiotics, and Sulfa drugs cross reactors  Review of Systems   Review of Systems  Constitutional: Negative for fever.  HENT: Negative for ear pain and sore throat.   Eyes: Negative for visual disturbance.  Respiratory: Negative for cough and shortness of breath.   Cardiovascular: Negative for chest pain.  Gastrointestinal: Negative for abdominal pain, constipation, diarrhea, nausea and vomiting.  Genitourinary: Negative for dysuria and hematuria.  Musculoskeletal: Negative for back pain.  Skin: Negative for rash.  Neurological: Positive for weakness and headaches. Negative for numbness.  All other systems reviewed and are negative.   Physical Exam Updated Vital Signs BP (!) 156/93   Pulse 70   Temp 98.9 F (37.2 C) (Oral)   Resp 17   SpO2 96%   Physical Exam Vitals and nursing note reviewed.  Constitutional:      General: She is not in acute distress.    Appearance: She is well-developed.  HENT:     Head: Normocephalic and atraumatic.  Eyes:     Conjunctiva/sclera: Conjunctivae normal.  Cardiovascular:     Rate and Rhythm: Normal rate and regular rhythm.     Heart sounds: Normal heart sounds. No murmur.  Pulmonary:     Effort: Pulmonary effort is normal. No respiratory distress.     Breath sounds: Normal breath sounds. No wheezing, rhonchi or rales.  Abdominal:     General: Bowel sounds are normal.     Palpations: Abdomen is soft.     Tenderness: There is abdominal tenderness (mild periumbilical ttp). There is no rebound.  Musculoskeletal:     Cervical back:  Neck supple.  Skin:    General: Skin is warm and dry.  Neurological:     Mental Status: She is alert.     Comments: Mental Status:  Alert, thought content appropriate, able to give a coherent history. Slurred speech. Able to follow 2 step commands without difficulty.  Cranial Nerves:  II:  Peripheral visual fields grossly normal, pupils equal, round, reactive to light III,IV, VI: ptosis not present, extra-ocular motions intact bilaterally  V,VII: smile symmetric, but has right facial droop when muscles relaxed, facial light touch sensation equal VIII: hearing grossly normal to voice  X: uvula elevates symmetrically  XI: bilateral shoulder shrug symmetric and strong XII: midline tongue extension without fassiculations Motor:  Normal tone. 51/5 strength to RUE and RLE, 5/5 strength LUE/LLE major muscle groups including strong and equal grip strength and dorsiflexion/plantar flexion Sensory: decreased sensation to RUE/RLE Cerebellar: unable to completed with RUE/RLE due to weakness Gait: not assessed     ED Results / Procedures / Treatments   Labs (all labs ordered are listed, but only abnormal results are displayed) Labs Reviewed  CBC - Abnormal; Notable for the following components:      Result Value   WBC 3.9 (*)    HCT 35.5 (*)    All other components within normal limits  COMPREHENSIVE METABOLIC PANEL - Abnormal; Notable for the following components:   Potassium 3.0 (*)    Calcium 8.6 (*)    All other components within normal limits  PROTIME-INR  APTT  DIFFERENTIAL  I-STAT CHEM 8, ED    EKG EKG  Interpretation  Date/Time:  Tuesday September 14 2019 11:09:08 EST Ventricular Rate:  64 PR Interval:  162 QRS Duration: 92 QT Interval:  432 QTC Calculation: 445 R Axis:   -41 Text Interpretation: Normal sinus rhythm Left axis deviation Minimal voltage criteria for LVH, may be normal variant ( R in aVL ) Nonspecific T wave abnormality Abnormal ECG No significant change  since last tracing Confirmed by Merrily Pew [phone]) on 09/14/2019 11:16:00 PM   Radiology CT HEAD WO CONTRAST  Result Date: 09/14/2019 CLINICAL DATA:  Slurred speech, right-sided weakness. EXAM: CT HEAD WITHOUT CONTRAST TECHNIQUE: Contiguous axial images were obtained from the base of the skull through the vertex without intravenous contrast. COMPARISON:  Feb 26, 2019. FINDINGS: Brain: Mild chronic ischemic white matter disease is noted. No mass effect or midline shift is noted. Ventricular size is within normal limits. There is no evidence of mass lesion, hemorrhage or acute infarction. Vascular: No hyperdense vessel or unexpected calcification. Skull: Normal. Negative for fracture or focal lesion. Sinuses/Orbits: No acute finding. Other: None. IMPRESSION: Mild chronic ischemic white matter disease. No acute intracranial abnormality seen. Electronically Signed   By: Marijo Conception M.D.   On: 09/14/2019 11:49    Procedures Procedures (including critical care time)  Medications Ordered in ED Medications  sodium chloride flush (NS) 0.9 % injection 3 mL (has no administration in time range)  potassium chloride SA (KLOR-CON) CR tablet 40 mEq (40 mEq Oral Given 09/14/19 2123)  aspirin chewable tablet 324 mg (324 mg Oral Given 09/14/19 2123)  LORazepam (ATIVAN) injection 1 mg (1 mg Intravenous Given 09/14/19 2239)  morphine 4 MG/ML injection 4 mg (4 mg Intravenous Given 09/14/19 2247)  ondansetron (ZOFRAN) injection 4 mg (4 mg Intravenous Given 09/14/19 2245)    ED Course  I have reviewed the triage vital signs and the nursing notes.  Pertinent labs & imaging results that were available during my care of the patient were reviewed by me and considered in my medical decision making (see chart for details).    MDM Rules/Calculators/A&P                     63 y/o female with a h/o CVA with residual right sided deficits presenting for eval of worsening right sided weakness, right facial droop  and slurred speech. LNW 09/12/19. Out of window for tpa.   On exam, right sided weakness, facial droop, and slurred speech present.   Reviewed labs ordered in triage,  CBC with mild leukopenia, consistent with prior labs CMP with hypokalemia, normal lfts and kidney function  - supplemented po k after passing swallow eval coags wnl  CT head negative for acute intracranial abnormality.   MRI ordered.   Pt unable to tolerate MRI due to muscle cramping and back pain. She was given ativan and morphine.  11:00 PM discussed with MRI. It will likely be another several hours until MRI can be completed.   Case discussed with Dr. Alvino Chapel who is in agreement with plan for admission for stroke w/u.   11:57 PM CONSULT with Dr. Marlowe Sax who accepts patient for admission.   12:08 CONSULT with Dr. Rory Percy. He recommends ordering ua and cxr to r/o infection. He will consult on the patient.   Final Clinical Impression(s) / ED Diagnoses Final diagnoses:  None    Rx / DC Orders ED Discharge Orders    None       Rodney Booze, PA-C 09/15/19 0009    [place],  Harrold Donath, MD 09/20/19 [phone]

## 2019-09-15 ENCOUNTER — Emergency Department (HOSPITAL_COMMUNITY): Payer: Medicare Other

## 2019-09-15 ENCOUNTER — Inpatient Hospital Stay (HOSPITAL_COMMUNITY): Payer: Medicare Other

## 2019-09-15 ENCOUNTER — Encounter (HOSPITAL_COMMUNITY): Payer: Self-pay | Admitting: *Deleted

## 2019-09-15 DIAGNOSIS — I639 Cerebral infarction, unspecified: Secondary | ICD-10-CM | POA: Diagnosis not present

## 2019-09-15 DIAGNOSIS — R4781 Slurred speech: Secondary | ICD-10-CM

## 2019-09-15 DIAGNOSIS — Z7982 Long term (current) use of aspirin: Secondary | ICD-10-CM | POA: Diagnosis not present

## 2019-09-15 DIAGNOSIS — Z96643 Presence of artificial hip joint, bilateral: Secondary | ICD-10-CM | POA: Diagnosis present

## 2019-09-15 DIAGNOSIS — Z96651 Presence of right artificial knee joint: Secondary | ICD-10-CM | POA: Diagnosis present

## 2019-09-15 DIAGNOSIS — F329 Major depressive disorder, single episode, unspecified: Secondary | ICD-10-CM

## 2019-09-15 DIAGNOSIS — I10 Essential (primary) hypertension: Secondary | ICD-10-CM

## 2019-09-15 DIAGNOSIS — I6381 Other cerebral infarction due to occlusion or stenosis of small artery: Secondary | ICD-10-CM | POA: Diagnosis present

## 2019-09-15 DIAGNOSIS — R29818 Other symptoms and signs involving the nervous system: Secondary | ICD-10-CM | POA: Diagnosis present

## 2019-09-15 DIAGNOSIS — I69351 Hemiplegia and hemiparesis following cerebral infarction affecting right dominant side: Secondary | ICD-10-CM | POA: Diagnosis not present

## 2019-09-15 DIAGNOSIS — I6389 Other cerebral infarction: Secondary | ICD-10-CM

## 2019-09-15 DIAGNOSIS — E876 Hypokalemia: Secondary | ICD-10-CM | POA: Diagnosis present

## 2019-09-15 DIAGNOSIS — R471 Dysarthria and anarthria: Secondary | ICD-10-CM | POA: Diagnosis present

## 2019-09-15 DIAGNOSIS — R29810 Facial weakness: Secondary | ICD-10-CM | POA: Diagnosis present

## 2019-09-15 DIAGNOSIS — F411 Generalized anxiety disorder: Secondary | ICD-10-CM | POA: Diagnosis present

## 2019-09-15 DIAGNOSIS — E785 Hyperlipidemia, unspecified: Secondary | ICD-10-CM | POA: Diagnosis present

## 2019-09-15 DIAGNOSIS — K219 Gastro-esophageal reflux disease without esophagitis: Secondary | ICD-10-CM | POA: Diagnosis present

## 2019-09-15 DIAGNOSIS — F4310 Post-traumatic stress disorder, unspecified: Secondary | ICD-10-CM | POA: Diagnosis present

## 2019-09-15 DIAGNOSIS — R29706 NIHSS score 6: Secondary | ICD-10-CM | POA: Diagnosis present

## 2019-09-15 DIAGNOSIS — M1990 Unspecified osteoarthritis, unspecified site: Secondary | ICD-10-CM | POA: Diagnosis present

## 2019-09-15 DIAGNOSIS — F32A Depression, unspecified: Secondary | ICD-10-CM

## 2019-09-15 DIAGNOSIS — Z882 Allergy status to sulfonamides status: Secondary | ICD-10-CM | POA: Diagnosis not present

## 2019-09-15 DIAGNOSIS — Z885 Allergy status to narcotic agent status: Secondary | ICD-10-CM | POA: Diagnosis not present

## 2019-09-15 DIAGNOSIS — Z20828 Contact with and (suspected) exposure to other viral communicable diseases: Secondary | ICD-10-CM | POA: Diagnosis present

## 2019-09-15 DIAGNOSIS — E7800 Pure hypercholesterolemia, unspecified: Secondary | ICD-10-CM

## 2019-09-15 DIAGNOSIS — Z8673 Personal history of transient ischemic attack (TIA), and cerebral infarction without residual deficits: Secondary | ICD-10-CM

## 2019-09-15 LAB — URINALYSIS, ROUTINE W REFLEX MICROSCOPIC
Bilirubin Urine: NEGATIVE
Glucose, UA: NEGATIVE mg/dL
Hgb urine dipstick: NEGATIVE
Ketones, ur: NEGATIVE mg/dL
Leukocytes,Ua: NEGATIVE
Nitrite: NEGATIVE
Protein, ur: NEGATIVE mg/dL
Specific Gravity, Urine: >1.046 — ABNORMAL HIGH (ref 1.005–1.030)
pH: 6 (ref 5.0–8.0)

## 2019-09-15 LAB — BASIC METABOLIC PANEL
Anion gap: 9 (ref 5–15)
BUN: 13 mg/dL (ref 8–23)
CO2: 30 mmol/L (ref 22–32)
Calcium: 8.8 mg/dL — ABNORMAL LOW (ref 8.9–10.3)
Chloride: 107 mmol/L (ref 98–111)
Creatinine, Ser: 0.91 mg/dL (ref 0.44–1.00)
GFR calc Af Amer: >60 mL/min (ref >60)
GFR calc non Af Amer: >60 mL/min (ref >60)
Glucose, Bld: 96 mg/dL (ref 70–99)
Potassium: 4 mmol/L (ref 3.5–5.1)
Sodium: 146 mmol/L — ABNORMAL HIGH (ref 135–145)

## 2019-09-15 LAB — LIPID PANEL
Cholesterol: 175 mg/dL (ref 0–200)
HDL: 53 mg/dL (ref >40)
LDL Cholesterol: 114 mg/dL — ABNORMAL HIGH (ref 0–99)
Total CHOL/HDL Ratio: 3.3 RATIO
Triglycerides: 42 mg/dL (ref <150)
VLDL: 8 mg/dL (ref 0–40)

## 2019-09-15 LAB — ECHOCARDIOGRAM COMPLETE

## 2019-09-15 LAB — HIV ANTIBODY (ROUTINE TESTING W REFLEX): HIV Screen 4th Generation wRfx: NONREACTIVE

## 2019-09-15 LAB — RAPID URINE DRUG SCREEN, HOSP PERFORMED
Amphetamines: NOT DETECTED
Barbiturates: NOT DETECTED
Benzodiazepines: POSITIVE — AB
Cocaine: NOT DETECTED
Opiates: POSITIVE — AB
Tetrahydrocannabinol: POSITIVE — AB

## 2019-09-15 LAB — MAGNESIUM: Magnesium: 1.6 mg/dL — ABNORMAL LOW (ref 1.7–2.4)

## 2019-09-15 LAB — HEMOGLOBIN A1C
Hgb A1c MFr Bld: 5.2 % (ref 4.8–5.6)
Mean Plasma Glucose: 102.54 mg/dL

## 2019-09-15 LAB — SARS CORONAVIRUS 2 (TAT 6-24 HRS): SARS Coronavirus 2: NEGATIVE

## 2019-09-15 MED ORDER — QUETIAPINE FUMARATE 50 MG PO TABS
50.0000 mg | ORAL_TABLET | Freq: Every day | ORAL | Status: DC
  Administered 2019-09-15 – 2019-09-16 (×3): 50 mg via ORAL
  Filled 2019-09-15 (×4): qty 1

## 2019-09-15 MED ORDER — PANTOPRAZOLE SODIUM 40 MG PO TBEC
40.0000 mg | DELAYED_RELEASE_TABLET | Freq: Every day | ORAL | Status: DC
  Administered 2019-09-15 – 2019-09-17 (×3): 40 mg via ORAL
  Filled 2019-09-15 (×3): qty 1

## 2019-09-15 MED ORDER — ACETAMINOPHEN 325 MG PO TABS
650.0000 mg | ORAL_TABLET | ORAL | Status: DC | PRN
  Administered 2019-09-16: 650 mg via ORAL
  Filled 2019-09-15: qty 2

## 2019-09-15 MED ORDER — CLONIDINE HCL 0.1 MG PO TABS
0.3000 mg | ORAL_TABLET | Freq: Two times a day (BID) | ORAL | Status: DC
  Administered 2019-09-15: 0.3 mg via ORAL
  Filled 2019-09-15: qty 3
  Filled 2019-09-15: qty 1

## 2019-09-15 MED ORDER — ACETAMINOPHEN 160 MG/5ML PO SOLN: 650.0000 mg | ORAL | Status: DC | PRN

## 2019-09-15 MED ORDER — VITAMIN D 25 MCG (1000 UNIT) PO TABS
2000.0000 [IU] | ORAL_TABLET | Freq: Every day | ORAL | Status: DC
  Administered 2019-09-15 – 2019-09-17 (×3): 2000 [IU] via ORAL
  Filled 2019-09-15 (×3): qty 2

## 2019-09-15 MED ORDER — ASCORBIC ACID 500 MG PO TABS
500.0000 mg | ORAL_TABLET | Freq: Every day | ORAL | Status: DC
  Administered 2019-09-15 – 2019-09-17 (×3): 500 mg via ORAL
  Filled 2019-09-15 (×3): qty 1

## 2019-09-15 MED ORDER — CLOPIDOGREL BISULFATE 75 MG PO TABS
75.0000 mg | ORAL_TABLET | Freq: Every day | ORAL | Status: DC
  Administered 2019-09-15 – 2019-09-17 (×3): 75 mg via ORAL
  Filled 2019-09-15 (×4): qty 1

## 2019-09-15 MED ORDER — METOPROLOL TARTRATE 12.5 MG HALF TABLET
12.5000 mg | ORAL_TABLET | Freq: Two times a day (BID) | ORAL | Status: DC
  Administered 2019-09-15 – 2019-09-17 (×4): 12.5 mg via ORAL
  Filled 2019-09-15 (×3): qty 1

## 2019-09-15 MED ORDER — METOPROLOL SUCCINATE ER 100 MG PO TB24
100.0000 mg | ORAL_TABLET | Freq: Every day | ORAL | Status: DC
  Filled 2019-09-15: qty 1

## 2019-09-15 MED ORDER — LOSARTAN POTASSIUM 50 MG PO TABS
100.0000 mg | ORAL_TABLET | Freq: Every day | ORAL | Status: DC
  Filled 2019-09-15: qty 2

## 2019-09-15 MED ORDER — IOHEXOL 350 MG/ML SOLN
80.0000 mL | Freq: Once | INTRAVENOUS | Status: AC | PRN
  Administered 2019-09-15: 80 mL via INTRAVENOUS

## 2019-09-15 MED ORDER — AMLODIPINE BESYLATE 2.5 MG PO TABS
2.5000 mg | ORAL_TABLET | Freq: Every day | ORAL | Status: DC
  Filled 2019-09-15: qty 1

## 2019-09-15 MED ORDER — ASPIRIN 325 MG PO TABS: 325.0000 mg | ORAL_TABLET | Freq: Every day | ORAL | Status: DC

## 2019-09-15 MED ORDER — ATORVASTATIN CALCIUM 40 MG PO TABS
40.0000 mg | ORAL_TABLET | Freq: Every day | ORAL | Status: DC
  Administered 2019-09-15: 40 mg via ORAL
  Filled 2019-09-15: qty 1

## 2019-09-15 MED ORDER — ENOXAPARIN SODIUM 40 MG/0.4ML ~~LOC~~ SOLN
40.0000 mg | SUBCUTANEOUS | Status: DC
  Administered 2019-09-15 – 2019-09-17 (×3): 40 mg via SUBCUTANEOUS
  Filled 2019-09-15 (×3): qty 0.4

## 2019-09-15 MED ORDER — DOXAZOSIN MESYLATE 8 MG PO TABS: 8.0000 mg | ORAL_TABLET | Freq: Every day | ORAL | Status: DC

## 2019-09-15 MED ORDER — PNEUMOCOCCAL VAC POLYVALENT 25 MCG/0.5ML IJ INJ: 0.5000 mL | INJECTION | INTRAMUSCULAR | Status: DC

## 2019-09-15 MED ORDER — MAGNESIUM SULFATE 2 GM/50ML IV SOLN
2.0000 g | Freq: Once | INTRAVENOUS | Status: AC
  Administered 2019-09-15: 2 g via INTRAVENOUS
  Filled 2019-09-15: qty 50

## 2019-09-15 MED ORDER — CITALOPRAM HYDROBROMIDE 40 MG PO TABS
40.0000 mg | ORAL_TABLET | Freq: Every day | ORAL | Status: DC
  Administered 2019-09-16 – 2019-09-17 (×2): 40 mg via ORAL
  Filled 2019-09-15: qty 1
  Filled 2019-09-15: qty 4
  Filled 2019-09-15 (×2): qty 1

## 2019-09-15 MED ORDER — ASPIRIN EC 81 MG PO TBEC
81.0000 mg | DELAYED_RELEASE_TABLET | Freq: Every day | ORAL | Status: DC
  Administered 2019-09-15 – 2019-09-17 (×3): 81 mg via ORAL
  Filled 2019-09-15 (×3): qty 1

## 2019-09-15 MED ORDER — POTASSIUM CHLORIDE CRYS ER 20 MEQ PO TBCR
40.0000 meq | EXTENDED_RELEASE_TABLET | Freq: Once | ORAL | Status: AC
  Administered 2019-09-15: 40 meq via ORAL
  Filled 2019-09-15: qty 2

## 2019-09-15 MED ORDER — ACETAMINOPHEN 650 MG RE SUPP: 650.0000 mg | RECTAL | Status: DC | PRN

## 2019-09-15 MED ORDER — SODIUM CHLORIDE 0.9 % IV BOLUS
500.0000 mL | Freq: Once | INTRAVENOUS | Status: AC
  Administered 2019-09-15: 500 mL via INTRAVENOUS

## 2019-09-15 MED ORDER — STROKE: EARLY STAGES OF RECOVERY BOOK
Freq: Once | Status: AC
  Filled 2019-09-15: qty 1

## 2019-09-15 MED ORDER — CLONAZEPAM 0.5 MG PO TABS
0.5000 mg | ORAL_TABLET | Freq: Two times a day (BID) | ORAL | Status: DC
  Administered 2019-09-15 – 2019-09-17 (×5): 0.5 mg via ORAL
  Filled 2019-09-15 (×5): qty 1

## 2019-09-15 NOTE — ED Notes (Signed)
ED TO INPATIENT HANDOFF REPORT  ED Nurse Name and Phone #: Mickeal Skinner [phone]  S Name/Age/Gender Bailey [name] 63 y.o. female Room/Bed: 009C/009C  Code Status   Code Status: Full Code  Home/SNF/Other Home Patient oriented to: self, place, time and situation Is this baseline? Yes   Triage Complete: Triage complete  Chief Complaint Neurological deficit present [R29.818] Acute CVA (cerebrovascular accident) [facility]) [I63.9]  Triage Note Pt arrives via POV from home with new onset slurred speech right arm and leg weakness. Pt reports she dizzy. Hx of strokes in the past. LKW Monday. VSS.     Allergies Allergies  Allergen Reactions  . Codeine Nausea Only and Other (See Comments)    headaches  . Sulfa Antibiotics Nausea Only    Upset stomach, headaches  . Sulfa Drugs Cross Reactors Other (See Comments)    Its been a long time ago    Level of Care/Admitting Diagnosis ED Disposition    ED Disposition Condition Comment   Admit  Hospital Area: [facility] [[id number]]  Level of Care: Telemetry Medical [104]  Covid Evaluation: Asymptomatic Screening Protocol (No Symptoms)  Diagnosis: Acute CVA (cerebrovascular accident) [facility] [[id number]]  Admitting Physician: John Giovanni [[id number]]  Attending Physician: John Giovanni [[id number]]  Estimated length of stay: past midnight tomorrow  Certification:: I certify this patient will need inpatient services for at least 2 midnights       B Medical/Surgery History Past Medical History:  Diagnosis Date  . Anxiety    "Breakdown" in the 1990's, hosp. for >1 wk. at [facility]    . Anxiety   . Arthritis    OA- R knee, L hip   . Asthma    years ago. "no problems in a long while."  . Constipation   . Depression   . GERD (gastroesophageal reflux disease)   . Goiter    removed  at age 71  . Hypertension    all BP meds. managed by Dr. Eula Listen    . Hypertension   . Incontinence of urine    weak bladder  .  Pneumonia    while at [facility].- [facility] told that she had pneumonia & was isolated   . Stroke (HCC)    2005, R sided weakness   . Stroke [facility]) 2005   ? weakness in hand and arms per mother. patient unsure   Past Surgical History:  Procedure Laterality Date  . ABDOMINAL HYSTERECTOMY    . COLONOSCOPY    . DILATION AND CURETTAGE OF UTERUS     1980's  . goiter     removed age 52  . JOINT REPLACEMENT  10/2011   Rt knee  . JOINT REPLACEMENT Right    knee  . THYROIDECTOMY, PARTIAL    . TONSILLECTOMY    . TOTAL HIP ARTHROPLASTY  12/23/2011   Procedure: TOTAL HIP ARTHROPLASTY;  Surgeon: Nestor Lewandowsky, MD;  Location: [facility] OR;  Service: Orthopedics;  Laterality: Left;  DEPUY/PINNACLE CUPS/SROM STEM  . TOTAL HIP ARTHROPLASTY Left   . TOTAL HIP ARTHROPLASTY Right 02/15/2015   Procedure: TOTAL HIP ARTHROPLASTY;  Surgeon: Gean Birchwood, MD;  Location: [facility] OR;  Service: Orthopedics;  Laterality: Right;  . TOTAL KNEE ARTHROPLASTY  10/21/2011   Procedure: TOTAL KNEE ARTHROPLASTY;  Surgeon: Nestor Lewandowsky, MD;  Location: [facility] OR;  Service: Orthopedics;  Laterality: Right;  DEPUY SIGMA RP  . TUBAL LIGATION       A IV Location/Drains/Wounds Patient Lines/Drains/Airways Status   Active Line/Drains/Airways  Name:   Placement date:   Placement time:   Site:   Days:   Peripheral IV 09/14/19 Left Antecubital   09/14/19    2235    Antecubital   1          Intake/Output Last 24 hours No intake or output data in the 24 hours ending 09/15/19 0446  Labs/Imaging Results for orders placed or performed during the hospital encounter of 09/14/19 (from the past 48 hour(s))  Protime-INR     Status: None   Collection Time: 09/14/19 11:26 AM  Result Value Ref Range   Prothrombin Time 13.5 11.4 - 15.2 seconds   INR 1.0 0.8 - 1.2    Comment: (NOTE) INR goal varies based on device and disease states. Performed at [facility], [address], [place], [place] [address]   APTT     Status: None    Collection Time: 09/14/19 11:26 AM  Result Value Ref Range   aPTT 29 24 - 36 seconds    Comment: Performed at [facility], [address], [place], [place] [address]  CBC     Status: Abnormal   Collection Time: 09/14/19 11:26 AM  Result Value Ref Range   WBC 3.9 (L) 4.0 - 10.5 K/uL   RBC 4.30 3.87 - 5.11 MIL/uL   Hemoglobin 12.6 12.0 - 15.0 g/dL   HCT 09.8 (L) 11.9 - 14.7 %   MCV 82.6 80.0 - 100.0 fL   MCH 29.3 26.0 - 34.0 pg   MCHC 35.5 30.0 - 36.0 g/dL   RDW 82.9 56.2 - 13.0 %   Platelets 266 150 - 400 K/uL   nRBC 0.0 0.0 - 0.2 %    Comment: Performed at [facility], [address], [place], [place] [address]  Differential     Status: None   Collection Time: 09/14/19 11:26 AM  Result Value Ref Range   Neutrophils Relative % 55 %   Neutro Abs 2.2 1.7 - 7.7 K/uL   Lymphocytes Relative 29 %   Lymphs Abs 1.2 0.7 - 4.0 K/uL   Monocytes Relative 11 %   Monocytes Absolute 0.4 0.1 - 1.0 K/uL   Eosinophils Relative 4 %   Eosinophils Absolute 0.1 0.0 - 0.5 K/uL   Basophils Relative 1 %   Basophils Absolute 0.0 0.0 - 0.1 K/uL   Immature Granulocytes 0 %   Abs Immature Granulocytes 0.01 0.00 - 0.07 K/uL    Comment: Performed at [facility], [address], [place], [place] [address]  Comprehensive metabolic panel     Status: Abnormal   Collection Time: 09/14/19 11:26 AM  Result Value Ref Range   Sodium 143 135 - 145 mmol/L   Potassium 3.0 (L) 3.5 - 5.1 mmol/L   Chloride 102 98 - 111 mmol/L   CO2 30 22 - 32 mmol/L   Glucose, Bld 96 70 - 99 mg/dL   BUN 15 8 - 23 mg/dL   Creatinine, Ser 9.52 0.44 - 1.00 mg/dL   Calcium 8.6 (L) 8.9 - 10.3 mg/dL   Total Protein 6.8 6.5 - 8.1 g/dL   Albumin 3.5 3.5 - 5.0 g/dL   AST 17 15 - 41 U/L   ALT 15 0 - 44 U/L   Alkaline Phosphatase 95 38 - 126 U/L   Total Bilirubin 1.2 0.3 - 1.2 mg/dL   GFR calc non Af Amer >60 >60 mL/min   GFR calc Af Amer >60 >60 mL/min  Anion gap 11 5 - 15    Comment: Performed at [facility] [address], [place], [place] [address]   CT ANGIO HEAD W OR WO CONTRAST  Result Date: 09/15/2019 CLINICAL DATA:  Follow-up examination for acute stroke. EXAM: CT ANGIOGRAPHY HEAD AND NECK TECHNIQUE: Multidetector CT imaging of the head and neck was performed using the standard protocol during bolus administration of intravenous contrast. Multiplanar CT image reconstructions and MIPs were obtained to evaluate the vascular anatomy. Carotid stenosis measurements (when applicable) are obtained utilizing NASCET criteria, using the distal internal carotid diameter as the denominator. CONTRAST:  93mL OMNIPAQUE IOHEXOL 350 MG/ML SOLN COMPARISON:  Prior MRI from earlier the same day. FINDINGS: CTA NECK FINDINGS Aortic arch: Visualized aortic arch of normal caliber with normal branch pattern. No hemodynamically significant stenosis seen about the origin of the great vessels. Visualized subclavian arteries widely patent. Right carotid system: Right common and internal carotid arteries are diffusely tortuous but widely patent to the skull base without stenosis, dissection or occlusion. Right carotid artery system medialized into the retropharyngeal space. Left carotid system: Left common and internal carotid arteries are diffusely tortuous but widely patent to the skull base without stenosis, dissection or occlusion. Left carotid artery system medialized into the retropharyngeal space. Vertebral arteries: Both vertebral arteries arise from the subclavian arteries. Vertebral arteries mildly tortuous but widely patent within the neck without stenosis, dissection or occlusion. Skeleton: No acute osseous finding. No discrete lytic or blastic osseous lesions. Other neck: No other acute soft tissue abnormality within the neck. Enlarged multinodular right lobe of thyroid is seen. Dominant 19 mm hypodense right thyroid not (series 6, image 116). Left thyroid lobe hypoplastic and/or absent. Upper chest: Visualized  upper chest demonstrates no acute finding. Partially visualized lungs are largely clear. Review of the MIP images confirms the above findings CTA HEAD FINDINGS Anterior circulation: Both petrous segments widely patent. Mild scattered atheromatous plaque within the cavernous/supraclinoid ICAs without significant stenosis. A1 segments widely patent. Normal anterior communicating artery. Anterior cerebral arteries widely patent to their distal aspects without stenosis. M1 segments widely patent bilaterally. Normal MCA bifurcations. Distal MCA branches well perfused and symmetric. Posterior circulation: Vertebral arteries mildly irregular but patent to the vertebrobasilar junction without flow-limiting stenosis. Patent right PICA. Left PICA not seen. Basilar widely patent to its distal aspect. Basilar tip somewhat ectatic without discrete aneurysm. Superior cerebral arteries patent bilaterally. Right PCA supplied via the basilar as well as a small right posterior communicating artery. Fetal type origin of the left PCA. PCAs mildly irregular but are well perfused to their distal aspects without significant stenosis. Venous sinuses: Grossly patent allowing for timing the contrast bolus. Anatomic variants: Fetal type origin left PCA. No intracranial aneurysm or other vascular abnormality. Review of the MIP images confirms the above findings IMPRESSION: 1. Negative CTA for large vessel occlusion. No high-grade or correctable stenosis identified. 2. Diffuse tortuosity of the major arterial vasculature of the head and neck, suggesting chronic underlying hypertension. 3. Enlarged multinodular right lobe of thyroid with dominant 19 mm right thyroid nodule. Further evaluation with dedicated thyroid ultrasound recommended. This could be performed on a nonemergent basis.(ref: J Am Coll Radiol. 2015 Feb;12(2): 143-50). Electronically Signed   By: Jeannine Boga M.D.   On: 09/15/2019 04:06   CT HEAD WO CONTRAST  Result  Date: 09/14/2019 CLINICAL DATA:  Slurred speech, right-sided weakness. EXAM: CT HEAD WITHOUT CONTRAST TECHNIQUE: Contiguous axial images were obtained from the base of the skull through  the vertex without intravenous contrast. COMPARISON:  Feb 26, 2019. FINDINGS: Brain: Mild chronic ischemic white matter disease is noted. No mass effect or midline shift is noted. Ventricular size is within normal limits. There is no evidence of mass lesion, hemorrhage or acute infarction. Vascular: No hyperdense vessel or unexpected calcification. Skull: Normal. Negative for fracture or focal lesion. Sinuses/Orbits: No acute finding. Other: None. IMPRESSION: Mild chronic ischemic white matter disease. No acute intracranial abnormality seen. Electronically Signed   By: Lupita RaiderJames  Green Jr M.D.   On: 09/14/2019 11:49   CT ANGIO NECK W OR WO CONTRAST  Result Date: 09/15/2019 CLINICAL DATA:  Follow-up examination for acute stroke. EXAM: CT ANGIOGRAPHY HEAD AND NECK TECHNIQUE: Multidetector CT imaging of the head and neck was performed using the standard protocol during bolus administration of intravenous contrast. Multiplanar CT image reconstructions and MIPs were obtained to evaluate the vascular anatomy. Carotid stenosis measurements (when applicable) are obtained utilizing NASCET criteria, using the distal internal carotid diameter as the denominator. CONTRAST:  80mL OMNIPAQUE IOHEXOL 350 MG/ML SOLN COMPARISON:  Prior MRI from earlier the same day. FINDINGS: CTA NECK FINDINGS Aortic arch: Visualized aortic arch of normal caliber with normal branch pattern. No hemodynamically significant stenosis seen about the origin of the great vessels. Visualized subclavian arteries widely patent. Right carotid system: Right common and internal carotid arteries are diffusely tortuous but widely patent to the skull base without stenosis, dissection or occlusion. Right carotid artery system medialized into the retropharyngeal space. Left carotid  system: Left common and internal carotid arteries are diffusely tortuous but widely patent to the skull base without stenosis, dissection or occlusion. Left carotid artery system medialized into the retropharyngeal space. Vertebral arteries: Both vertebral arteries arise from the subclavian arteries. Vertebral arteries mildly tortuous but widely patent within the neck without stenosis, dissection or occlusion. Skeleton: No acute osseous finding. No discrete lytic or blastic osseous lesions. Other neck: No other acute soft tissue abnormality within the neck. Enlarged multinodular right lobe of thyroid is seen. Dominant 19 mm hypodense right thyroid not (series 6, image 116). Left thyroid lobe hypoplastic and/or absent. Upper chest: Visualized upper chest demonstrates no acute finding. Partially visualized lungs are largely clear. Review of the MIP images confirms the above findings CTA HEAD FINDINGS Anterior circulation: Both petrous segments widely patent. Mild scattered atheromatous plaque within the cavernous/supraclinoid ICAs without significant stenosis. A1 segments widely patent. Normal anterior communicating artery. Anterior cerebral arteries widely patent to their distal aspects without stenosis. M1 segments widely patent bilaterally. Normal MCA bifurcations. Distal MCA branches well perfused and symmetric. Posterior circulation: Vertebral arteries mildly irregular but patent to the vertebrobasilar junction without flow-limiting stenosis. Patent right PICA. Left PICA not seen. Basilar widely patent to its distal aspect. Basilar tip somewhat ectatic without discrete aneurysm. Superior cerebral arteries patent bilaterally. Right PCA supplied via the basilar as well as a small right posterior communicating artery. Fetal type origin of the left PCA. PCAs mildly irregular but are well perfused to their distal aspects without significant stenosis. Venous sinuses: Grossly patent allowing for timing the contrast  bolus. Anatomic variants: Fetal type origin left PCA. No intracranial aneurysm or other vascular abnormality. Review of the MIP images confirms the above findings IMPRESSION: 1. Negative CTA for large vessel occlusion. No high-grade or correctable stenosis identified. 2. Diffuse tortuosity of the major arterial vasculature of the head and neck, suggesting chronic underlying hypertension. 3. Enlarged multinodular right lobe of thyroid with dominant 19 mm right thyroid nodule. Further evaluation  with dedicated thyroid ultrasound recommended. This could be performed on a nonemergent basis.(ref: J Am Coll Radiol. 2015 Feb;12(2): 143-50). Electronically Signed   By: Rise Mu M.D.   On: 09/15/2019 04:06   MR BRAIN WO CONTRAST  Result Date: 09/15/2019 CLINICAL DATA:  Initial evaluation for acute weakness. EXAM: MRI HEAD WITHOUT CONTRAST TECHNIQUE: Multiplanar, multiecho pulse sequences of the brain and surrounding structures were obtained without intravenous contrast. COMPARISON:  Prior CT from 09/14/2019. FINDINGS: Brain: Generalized age-related cerebral atrophy. Patchy and confluent T2/FLAIR hyperintensity within the periventricular deep white matter both cerebral hemispheres most consistent with chronic small vessel ischemic disease, severe in nature. Multiple scatter remote lacunar infarcts seen involving the bilateral basal ganglia, thalami, and periventricular white matter of the corona radiata. Associated chronic hemosiderin staining seen about several of these infarcts. Few scattered remote subcentimeter bilateral cerebellar infarcts noted as well. There is a 11 mm focus of restricted diffusion involving the posterior left basal ganglia, consistent with an acute ischemic perforator type infarct (series 5, image 73). No associated hemorrhage or mass effect. No other evidence for acute or subacute ischemia. No acute intracranial hemorrhage elsewhere within the brain. Multiple scattered chronic micro  hemorrhages seen clustered about the, consistent with chronic poorly controlled hypertension. No mass lesion, midline shift or mass effect. No hydrocephalus. No extra-axial fluid collection. Pituitary gland and suprasellar region normal. Midline structures intact. Brainstem, and cerebellum Vascular: Major intracranial vascular flow voids are maintained. Skull and upper cervical spine: Craniocervical junction within normal limits. Bone marrow signal intensity normal. No scalp soft tissue abnormality. Sinuses/Orbits: Globes and orbital soft tissues demonstrate no acute finding. Paranasal sinuses are largely clear. Trace right mastoid effusion, of doubtful significance. Inner ear structures normal. Other: None. IMPRESSION: 1. 11 mm acute ischemic nonhemorrhagic left basal ganglia infarct. 2. Underlying age-related cerebral atrophy with severe chronic microvascular ischemic disease, with multiple remote lacunar infarcts as above. 3. Multiple scattered chronic micro hemorrhages clustered about the deep gray nuclei, brainstem, and cerebellum, consistent with chronic poorly controlled hypertension. Electronically Signed   By: Rise Mu M.D.   On: 09/15/2019 01:31   DG Chest Portable 1 View  Result Date: 09/15/2019 CLINICAL DATA:  CVA. Slurred speech. Right arm and leg weakness. EXAM: PORTABLE CHEST 1 VIEW COMPARISON:  Radiograph of 10/01/2018 FINDINGS: Borderline cardiomegaly stable from prior. Unchanged mediastinal contours with aortic tortuosity. Minor bibasilar atelectasis. No pulmonary edema, pleural fluid or pneumothorax. No acute osseous abnormalities are seen. IMPRESSION: 1. Borderline cardiomegaly, stable from prior. 2. Minor bibasilar atelectasis. Electronically Signed   By: Narda Rutherford M.D.   On: 09/15/2019 00:53    Pending Labs Unresulted Labs (From admission, onward)    Start     Ordered   09/15/19 0500  Hemoglobin A1c  Tomorrow morning,   R     09/15/19 0037   09/15/19 0500  Lipid  panel  Tomorrow morning,   R    Comments: Fasting    09/15/19 0037   09/15/19 0500  Basic metabolic panel  Tomorrow morning,   R     09/15/19 0037   09/15/19 0236  Urine rapid drug screen (hosp performed)  ONCE - STAT,   STAT     09/15/19 0236   09/15/19 0037  Magnesium  Add-on,   AD     09/15/19 0037   09/15/19 0036  HIV Antibody (routine testing w rflx)  (HIV Antibody (Routine testing w reflex) panel)  Once,   STAT     09/15/19 0037  09/15/19 0009  SARS CORONAVIRUS 2 (TAT 6-24 HRS) Nasopharyngeal Nasopharyngeal Swab  (Tier 3 (TAT 6-24 hrs))  Once,   STAT    Question Answer Comment  Is this test for diagnosis or screening Screening   Symptomatic for COVID-19 as defined by CDC No   Hospitalized for COVID-19 No   Admitted to ICU for COVID-19 No   Previously tested for COVID-19 No   Resident in a congregate (group) care setting Unknown   Employed in healthcare setting Unknown   Pregnant No      09/15/19 0008   09/15/19 0008  Urinalysis, Routine w reflex microscopic  Once,   STAT     09/15/19 0007          Vitals/Pain Today's Vitals   09/15/19 0015 09/15/19 0115 09/15/19 0201 09/15/19 0259  BP: (!) 137/96 133/88 140/86 (!) 142/82  Pulse: 76 69 70 74  Resp: Temp:      TempSrc:      SpO2: 91% 93% 96% 96%  PainSc:   5      Isolation Precautions No active isolations  Medications Medications  sodium chloride flush (NS) 0.9 % injection 3 mL (has no administration in time range)  morphine 4 MG/ML injection 4 mg (has no administration in time range)  citalopram (CELEXA) tablet 40 mg (has no administration in time range)  QUEtiapine (SEROQUEL) tablet 50 mg (50 mg Oral Given 09/15/19 0327)  pantoprazole (PROTONIX) EC tablet 40 mg (has no administration in time range)  clonazePAM (KLONOPIN) tablet 0.5 mg (has no administration in time range)  cholecalciferol (VITAMIN D3) tablet 2,000 Units (has no administration in time range)  ascorbic acid (VITAMIN C) tablet  500 mg (has no administration in time range)   stroke: mapping our early stages of recovery book (has no administration in time range)  acetaminophen (TYLENOL) tablet 650 mg (has no administration in time range)    Or  acetaminophen (TYLENOL) 160 MG/5ML solution 650 mg (has no administration in time range)    Or  acetaminophen (TYLENOL) suppository 650 mg (has no administration in time range)  enoxaparin (LOVENOX) injection 40 mg (has no administration in time range)  aspirin tablet 325 mg (has no administration in time range)  amLODipine (NORVASC) tablet 2.5 mg (has no administration in time range)  cloNIDine (CATAPRES) tablet 0.3 mg (0.3 mg Oral Given 09/15/19 0321)  doxazosin (CARDURA) tablet 8 mg (has no administration in time range)  losartan (COZAAR) tablet 100 mg (has no administration in time range)  metoprolol succinate (TOPROL-XL) 24 hr tablet 100 mg (has no administration in time range)  potassium chloride SA (KLOR-CON) CR tablet 40 mEq (40 mEq Oral Given 09/14/19 2123)  aspirin chewable tablet 324 mg (324 mg Oral Given 09/14/19 2123)  LORazepam (ATIVAN) injection 1 mg (1 mg Intravenous Given 09/14/19 2239)  morphine 4 MG/ML injection 4 mg (4 mg Intravenous Given 09/14/19 2247)  ondansetron (ZOFRAN) injection 4 mg (4 mg Intravenous Given 09/14/19 2245)  potassium chloride SA (KLOR-CON) CR tablet 40 mEq (40 mEq Oral Given 09/15/19 0327)  iohexol (OMNIPAQUE) 350 MG/ML injection 80 mL (80 mLs Intravenous Contrast Given 09/15/19 0312)    Mobility  Moderate fall risk   Focused Assessments    R Recommendations: See Admitting Provider Note  Report given to:   Additional Notes:

## 2019-09-15 NOTE — Plan of Care (Signed)
Patient progressing towards plan of care goals. 

## 2019-09-15 NOTE — Consult Note (Addendum)
Neurology Consultation  Reason for Consult: Stroke Referring Physician: Dr. Loney Lohathore  CC: Worsening right-sided weakness and right-sided facial droop  History is obtained from: Patient, chart review  HPI: Bailey [name] is a 63 y.o. female past medical history of a stroke with residual right-sided weakness, goiter with thyroid surgery at the age of 63, memory problems for the past few years, depression, anxiety, concern for seizure-like activity in 2016-not on any antiepileptics, presented to the emergency room for worsening right-sided weakness that started suddenly-last known normal the night of 09/12/2019 when she went to bed and woke up on Monday morning with the symptoms. According to report, family also noted some right-sided facial weakness which is not her baseline as well as some slurred speech. Denies any preceding illnesses or sicknesses-fevers chills shortness of breath nausea vomiting. Denies any headache or visual changes at this time. Reports abdominal pain which has been ongoing for many months, and remains unchanged. Denies any smoking or illicit drug use. Chart review did mention that her urinary toxicology screen, in 2016 was positive for opiates and cocaine.  MRI performed in the emergency room shows an acute lacunar infarct in the left basal ganglia.  Also background white matter disease and chronic microhemorrhages.   LKW: The night of 09/12/2019 tpa given?: no, outside the window Premorbid modified Rankin scale (mRS):2  ROS: ROS was performed and is negative except as noted in the HPI.   Past Medical History:  Diagnosis Date  . Anxiety    "Breakdown" in the 1990's, hosp. for >1 wk. at [facility]    . Anxiety   . Arthritis    OA- R knee, L hip   . Asthma    years ago. "no problems in a long while."  . Constipation   . Depression   . GERD (gastroesophageal reflux disease)   . Goiter    removed  at age 63  . Hypertension    all BP meds. managed by Dr.  Eula ListenHussain    . Hypertension   . Incontinence of urine    weak bladder  . Pneumonia    [facility] told that she had pneumonia & was isolated   . Stroke (HCC)    2005, R sided weakness   . Stroke [facility](HCC) 2005   ? weakness in hand and arms per mother. patient unsure   Family History  Problem Relation Age of Onset  . Anesthesia problems Neg Hx   . Hypotension Neg Hx   . Malignant hyperthermia Neg Hx   . Pseudochol deficiency Neg Hx   . Alcohol abuse Neg Hx   . Anxiety disorder Neg Hx   . Bipolar disorder Neg Hx   . Depression Neg Hx   . Dementia Neg Hx   . Breast cancer Neg Hx     Social History:   reports that she has never smoked. She has never used smokeless tobacco. She reports current alcohol use. She reports that she does not use drugs.  Medications  Current Facility-Administered Medications:  .   stroke: mapping our early stages of recovery book, , Does not apply, Once, John Giovanniathore, Vasundhra, MD .  acetaminophen (TYLENOL) tablet 650 mg, 650 mg, Oral, Q4H PRN **OR** acetaminophen (TYLENOL) 160 MG/5ML solution 650 mg, 650 mg, Per Tube, Q4H PRN **OR** acetaminophen (TYLENOL) suppository 650 mg, 650 mg, Rectal, Q4H PRN, John Giovanniathore, Vasundhra, MD .  ascorbic acid (VITAMIN C) tablet 500 mg, 500 mg, Oral, Daily, John Giovanniathore, Vasundhra, MD .  cholecalciferol (VITAMIN  D3) tablet 2,000 Units, 2,000 Units, Oral, Daily, John Giovanni, MD .  citalopram (CELEXA) tablet 40 mg, 40 mg, Oral, Daily, John Giovanni, MD .  clonazePAM (KLONOPIN) tablet 0.5 mg, 0.5 mg, Oral, BID, John Giovanni, MD .  enoxaparin (LOVENOX) injection 40 mg, 40 mg, Subcutaneous, Q24H, Rathore, Vasundhra, MD .  LORazepam (ATIVAN) injection 1 mg, 1 mg, Intravenous, Once PRN, John Giovanni, MD .  morphine 4 MG/ML injection 4 mg, 4 mg, Intravenous, Once PRN, John Giovanni, MD .  pantoprazole (PROTONIX) EC tablet 40 mg, 40 mg, Oral, Daily, Rathore, Ulyess Blossom, MD .  potassium chloride SA  (KLOR-CON) CR tablet 40 mEq, 40 mEq, Oral, Once, John Giovanni, MD .  QUEtiapine (SEROQUEL) tablet 50 mg, 50 mg, Oral, QHS, Rathore, Vasundhra, MD .  sodium chloride flush (NS) 0.9 % injection 3 mL, 3 mL, Intravenous, Once, John Giovanni, MD  Current Outpatient Medications:  .  acetaminophen (TYLENOL) 325 MG tablet, Take 2 tablets (650 mg total) by mouth every 6 (six) hours as needed for mild pain (or Fever >/= 101)., Disp: , Rfl:  .  amLODipine (NORVASC) 2.5 MG tablet, Take 2.5 mg by mouth daily., Disp: , Rfl:  .  aspirin EC 325 MG tablet, Take 1 tablet (325 mg total) by mouth 2 (two) times daily., Disp: 30 tablet, Rfl: 0 .  Cholecalciferol (VITAMIN D) 2000 UNITS tablet, Take 2,000 Units by mouth daily., Disp: , Rfl:  .  citalopram (CELEXA) 40 MG tablet, Take 40 mg by mouth daily. , Disp: , Rfl:  .  clonazePAM (KLONOPIN) 0.5 MG tablet, Take 0.5 mg by mouth 2 (two) times daily., Disp: , Rfl:  .  cloNIDine (CATAPRES) 0.3 MG tablet, Take 0.3 mg by mouth 2 (two) times daily., Disp: , Rfl:  .  Cyanocobalamin (VITAMIN B 12 PO), Take 1 tablet by mouth daily. , Disp: , Rfl:  .  doxazosin (CARDURA) 8 MG tablet, Take 8 mg by mouth at bedtime., Disp: , Rfl:  .  Flaxseed, Linseed, (FLAX SEED OIL PO), Take 1,200 mg by mouth daily., Disp: , Rfl:  .  losartan (COZAAR) 100 MG tablet, Take 100 mg by mouth daily., Disp: , Rfl:  .  metoprolol (TOPROL-XL) 100 MG 24 hr tablet, Take 100 mg by mouth daily. , Disp: , Rfl:  .  omeprazole (PRILOSEC) 20 MG capsule, Take 40 mg by mouth daily., Disp: , Rfl:  .  potassium chloride (KLOR-CON) 20 MEQ packet, Take 20 mEq by mouth 2 (two) times daily. , Disp: , Rfl:  .  QUEtiapine (SEROQUEL) 50 MG tablet, Take 1 tablet (50 mg total) by mouth at bedtime., Disp: 30 tablet, Rfl: 1 .  vitamin C (ASCORBIC ACID) 500 MG tablet, Take 500 mg by mouth daily., Disp: , Rfl:    Exam: Current vital signs: BP 133/88   Pulse 69   Temp 98.9 F (37.2 C) (Oral)   Resp 14   SpO2  93%  Vital signs in last 24 hours: Temp:  [98.9 F (37.2 C)-99.1 F (37.3 C)] 98.9 F (37.2 C) (12/15 1811) Pulse Rate:  [56-78] 69 (12/16 0115) Resp:  [14-24] 14 (12/16 0115) BP: (122-168)/(85-130) 133/88 (12/16 0115) SpO2:  [91 %-100 %] 93 % (12/16 0115) General: Awake alert in no distress HEENT: Normocephalic atraumatic Lungs: Clear to auscultation Cardiovascular: Regular rate rhythm Abdomen: Obese, mildly tender to palpation all over Extremities: Pedal edema 2+ in the right lower extremity, 1+ in the left lower extremity. Neurological exam Awake alert oriented x3 Speech is  mildly dysarthric No evidence of aphasia Cranial nerves: Pupils equal round reactive light, extraocular movements intact, visual fields full, facial sensation intact, subtle right lower facial weakness, tongue and palate midline. Motor exam: Increased tone in right upper and lower extremity with right upper extremity 3/5 and right lower extremity 1/5.  Left upper extremity is 4+/5.  Left lower extremity is 3/5. Sensory exam: Intact light touch Coordination: Intact finger-nose-finger testing on the left. Gait testing deferred at this time NIH stroke scale 1a Level of Conscious.: 0 1b LOC Questions: 0 1c LOC Commands: 0 2 Best Gaze: 0 3 Visual: 0 4 Facial Palsy: 1 5a Motor Arm - left: 0 5b Motor Arm - Right: 2 6a Motor Leg - Left: 1 6b Motor Leg - Right: 3 7 Limb Ataxia: 0 8 Sensory: 0 9 Best Language: 0 10 Dysarthria: 1 11 Extinct. and Inatten.: 0 TOTAL: 8   Labs I have reviewed labs in epic and the results pertinent to this consultation are:  CBC    Component Value Date/Time   WBC 3.9 (L) 09/14/2019 1126   RBC 4.30 09/14/2019 1126   HGB 12.6 09/14/2019 1126   HCT 35.5 (L) 09/14/2019 1126   PLT 266 09/14/2019 1126   MCV 82.6 09/14/2019 1126   MCH 29.3 09/14/2019 1126   MCHC 35.5 09/14/2019 1126   RDW 13.5 09/14/2019 1126   LYMPHSABS 1.2 09/14/2019 1126   MONOABS 0.4 09/14/2019 1126    EOSABS 0.1 09/14/2019 1126   BASOSABS 0.0 09/14/2019 1126    CMP     Component Value Date/Time   NA 143 09/14/2019 1126   K 3.0 (L) 09/14/2019 1126   CL 102 09/14/2019 1126   CO2 30 09/14/2019 1126   GLUCOSE 96 09/14/2019 1126   BUN 15 09/14/2019 1126   CREATININE 0.94 09/14/2019 1126   CALCIUM 8.6 (L) 09/14/2019 1126   PROT 6.8 09/14/2019 1126   ALBUMIN 3.5 09/14/2019 1126   AST 17 09/14/2019 1126   ALT 15 09/14/2019 1126   ALKPHOS 95 09/14/2019 1126   BILITOT 1.2 09/14/2019 1126   GFRNONAA >60 09/14/2019 1126   GFRAA >60 09/14/2019 1126    Imaging I have reviewed the images obtained  CT-scan of the brain-no acute changes.  Chronic white matter disease  MRI examination of the brain-left basal ganglia acute lacunar infarct.  Chronic white matter disease.  Chronic microhemorrhages.  Assessment: 63 year old with above past medical history that includes stroke with residual right hemiparesis and worsening right-sided weakness that started suddenly upon awakening on Monday morning 09/13/2019.  Symptoms did not improve, prompting the ER visit. MRI brain with a nonacute lacunar infarct in the left basal ganglia. Also has chronic microhemorrhages.  Chronic small vessel disease. Reports compliance to medications, but cannot tell me if she takes her aspirin daily. I suspect there is some component of noncompliance to medication and some cognitive decline at baseline as well.  She has seen Dr. Delice Lesch in outpatient clinic for memory evaluation couple of years ago and her MMSE was 26/30-at that time not warranting any treatment. There was some concern for seizure activity at that time which was supposed to be followed up with an EEG-do not see any further follow-up having been done since then. Current presentation, is likely due to a acute ischemic stroke, likely small vessel etiology due to uncontrollable risk factors. She did have a positive urinary toxicology screen for cocaine in 2016,  and although she tells me she has not been abusing drugs, I  would definitely check the toxicology screen again. Impression: Acute ischemic stroke-small vessel etiology  Recommendations: -Admit to hospitalist  -Telemetry monitoring -She is now day 3 from her last known normal-can normalize blood pressure and no need for permissive hypertension at this time. -CT Angiogram of Head and neck -Echocardiogram -HgbA1c, fasting lipid panel -Frequent neuro checks -Prophylactic therapy-Antiplatelet med: Aspirin - dose 325mg  PO or 300mg  PR -Risk factor modification -PT consult, OT consult, Speech consult -Urine toxicology screen -Might need surgical consult for medication compliance and help with making appointments as an outpatient.  Please page stroke NP/PA/MD (listed on AMION)  from 8am-4 pm as this patient will be followed by the stroke team at this point.   -- , MD [facility] Pager: [phone] If 7pm to 7am, please call on call as listed on AMION.

## 2019-09-15 NOTE — Social Work (Signed)
CSW acknowledging consult for SNF placement. Will follow for therapy recommendations needed to best determine disposition/for insurance authorization.    CSW also completed chart review, per media review we have no documented court appointed guardian. Pt confusion does not mean she has a guardian. [facility] deactivated.   Westley Hummer, MSW, [facility] [phone]

## 2019-09-15 NOTE — Progress Notes (Signed)
STROKE TEAM PROGRESS NOTE   INTERVAL HISTORY Pt sitting in chair, still has right facial droop and right hemiparesis. She stated that weakness worse than baseline. MRI showed left CR infarct. UDS positive for opiates, benzo and THC.  Vitals:   09/15/19 0744 09/15/19 0900 09/15/19 0930 09/15/19 1018  BP: 116/79  93/62   Pulse: 78  62 62  Resp:  18    Temp: 98 F (36.7 C)     TempSrc: Oral     SpO2: 96%       CBC:  Recent Labs  Lab 09/14/19 1126  WBC 3.9*  NEUTROABS 2.2  HGB 12.6  HCT 35.5*  MCV 82.6  PLT 266    Basic Metabolic Panel:  Recent Labs  Lab 09/14/19 1126 09/15/19 0636  NA 143 146*  K 3.0* 4.0  CL 102 107  CO2 30 30  GLUCOSE 96 96  BUN 15 13  CREATININE 0.94 0.91  CALCIUM 8.6* 8.8*  MG  --  1.6*   Lipid Panel:     Component Value Date/Time   CHOL 175 09/15/2019 0636   TRIG 42 09/15/2019 0636   HDL 53 09/15/2019 0636   CHOLHDL 3.3 09/15/2019 0636   VLDL 8 09/15/2019 0636   LDLCALC 114 (H) 09/15/2019 0636   HgbA1c:  Lab Results  Component Value Date   HGBA1C 5.2 09/15/2019   Urine Drug Screen:     Component Value Date/Time   LABOPIA POSITIVE (A) 09/15/2019 1130   COCAINSCRNUR NONE DETECTED 09/15/2019 1130   LABBENZ POSITIVE (A) 09/15/2019 1130   AMPHETMU NONE DETECTED 09/15/2019 1130   THCU POSITIVE (A) 09/15/2019 1130   LABBARB NONE DETECTED 09/15/2019 1130    Alcohol Level No results found for: ETH  IMAGING CT ANGIO HEAD W OR WO CONTRAST  Result Date: 09/15/2019 CLINICAL DATA:  Follow-up examination for acute stroke. EXAM: CT ANGIOGRAPHY HEAD AND NECK TECHNIQUE: Multidetector CT imaging of the head and neck was performed using the standard protocol during bolus administration of intravenous contrast. Multiplanar CT image reconstructions and MIPs were obtained to evaluate the vascular anatomy. Carotid stenosis measurements (when applicable) are obtained utilizing NASCET criteria, using the distal internal carotid diameter as the  denominator. CONTRAST:  80mL OMNIPAQUE IOHEXOL 350 MG/ML SOLN COMPARISON:  Prior MRI from earlier the same day. FINDINGS: CTA NECK FINDINGS Aortic arch: Visualized aortic arch of normal caliber with normal branch pattern. No hemodynamically significant stenosis seen about the origin of the great vessels. Visualized subclavian arteries widely patent. Right carotid system: Right common and internal carotid arteries are diffusely tortuous but widely patent to the skull base without stenosis, dissection or occlusion. Right carotid artery system medialized into the retropharyngeal space. Left carotid system: Left common and internal carotid arteries are diffusely tortuous but widely patent to the skull base without stenosis, dissection or occlusion. Left carotid artery system medialized into the retropharyngeal space. Vertebral arteries: Both vertebral arteries arise from the subclavian arteries. Vertebral arteries mildly tortuous but widely patent within the neck without stenosis, dissection or occlusion. Skeleton: No acute osseous finding. No discrete lytic or blastic osseous lesions. Other neck: No other acute soft tissue abnormality within the neck. Enlarged multinodular right lobe of thyroid is seen. Dominant 19 mm hypodense right thyroid not (series 6, image 116). Left thyroid lobe hypoplastic and/or absent. Upper chest: Visualized upper chest demonstrates no acute finding. Partially visualized lungs are largely clear. Review of the MIP images confirms the above findings CTA HEAD FINDINGS Anterior circulation: Both petrous  segments widely patent. Mild scattered atheromatous plaque within the cavernous/supraclinoid ICAs without significant stenosis. A1 segments widely patent. Normal anterior communicating artery. Anterior cerebral arteries widely patent to their distal aspects without stenosis. M1 segments widely patent bilaterally. Normal MCA bifurcations. Distal MCA branches well perfused and symmetric. Posterior  circulation: Vertebral arteries mildly irregular but patent to the vertebrobasilar junction without flow-limiting stenosis. Patent right PICA. Left PICA not seen. Basilar widely patent to its distal aspect. Basilar tip somewhat ectatic without discrete aneurysm. Superior cerebral arteries patent bilaterally. Right PCA supplied via the basilar as well as a small right posterior communicating artery. Fetal type origin of the left PCA. PCAs mildly irregular but are well perfused to their distal aspects without significant stenosis. Venous sinuses: Grossly patent allowing for timing the contrast bolus. Anatomic variants: Fetal type origin left PCA. No intracranial aneurysm or other vascular abnormality. Review of the MIP images confirms the above findings IMPRESSION: 1. Negative CTA for large vessel occlusion. No high-grade or correctable stenosis identified. 2. Diffuse tortuosity of the major arterial vasculature of the head and neck, suggesting chronic underlying hypertension. 3. Enlarged multinodular right lobe of thyroid with dominant 19 mm right thyroid nodule. Further evaluation with dedicated thyroid ultrasound recommended. This could be performed on a nonemergent basis.(ref: J Am Coll Radiol. 2015 Feb;12(2): 143-50). Electronically Signed   By: Rise Mu M.D.   On: 09/15/2019 04:06   CT HEAD WO CONTRAST  Result Date: 09/14/2019 CLINICAL DATA:  Slurred speech, right-sided weakness. EXAM: CT HEAD WITHOUT CONTRAST TECHNIQUE: Contiguous axial images were obtained from the base of the skull through the vertex without intravenous contrast. COMPARISON:  Feb 26, 2019. FINDINGS: Brain: Mild chronic ischemic white matter disease is noted. No mass effect or midline shift is noted. Ventricular size is within normal limits. There is no evidence of mass lesion, hemorrhage or acute infarction. Vascular: No hyperdense vessel or unexpected calcification. Skull: Normal. Negative for fracture or focal lesion.  Sinuses/Orbits: No acute finding. Other: None. IMPRESSION: Mild chronic ischemic white matter disease. No acute intracranial abnormality seen. Electronically Signed   By: Lupita Raider M.D.   On: 09/14/2019 11:49   CT ANGIO NECK W OR WO CONTRAST  Result Date: 09/15/2019 CLINICAL DATA:  Follow-up examination for acute stroke. EXAM: CT ANGIOGRAPHY HEAD AND NECK TECHNIQUE: Multidetector CT imaging of the head and neck was performed using the standard protocol during bolus administration of intravenous contrast. Multiplanar CT image reconstructions and MIPs were obtained to evaluate the vascular anatomy. Carotid stenosis measurements (when applicable) are obtained utilizing NASCET criteria, using the distal internal carotid diameter as the denominator. CONTRAST:  80mL OMNIPAQUE IOHEXOL 350 MG/ML SOLN COMPARISON:  Prior MRI from earlier the same day. FINDINGS: CTA NECK FINDINGS Aortic arch: Visualized aortic arch of normal caliber with normal branch pattern. No hemodynamically significant stenosis seen about the origin of the great vessels. Visualized subclavian arteries widely patent. Right carotid system: Right common and internal carotid arteries are diffusely tortuous but widely patent to the skull base without stenosis, dissection or occlusion. Right carotid artery system medialized into the retropharyngeal space. Left carotid system: Left common and internal carotid arteries are diffusely tortuous but widely patent to the skull base without stenosis, dissection or occlusion. Left carotid artery system medialized into the retropharyngeal space. Vertebral arteries: Both vertebral arteries arise from the subclavian arteries. Vertebral arteries mildly tortuous but widely patent within the neck without stenosis, dissection or occlusion. Skeleton: No acute osseous finding. No discrete lytic or blastic  osseous lesions. Other neck: No other acute soft tissue abnormality within the neck. Enlarged multinodular right  lobe of thyroid is seen. Dominant 19 mm hypodense right thyroid not (series 6, image 116). Left thyroid lobe hypoplastic and/or absent. Upper chest: Visualized upper chest demonstrates no acute finding. Partially visualized lungs are largely clear. Review of the MIP images confirms the above findings CTA HEAD FINDINGS Anterior circulation: Both petrous segments widely patent. Mild scattered atheromatous plaque within the cavernous/supraclinoid ICAs without significant stenosis. A1 segments widely patent. Normal anterior communicating artery. Anterior cerebral arteries widely patent to their distal aspects without stenosis. M1 segments widely patent bilaterally. Normal MCA bifurcations. Distal MCA branches well perfused and symmetric. Posterior circulation: Vertebral arteries mildly irregular but patent to the vertebrobasilar junction without flow-limiting stenosis. Patent right PICA. Left PICA not seen. Basilar widely patent to its distal aspect. Basilar tip somewhat ectatic without discrete aneurysm. Superior cerebral arteries patent bilaterally. Right PCA supplied via the basilar as well as a small right posterior communicating artery. Fetal type origin of the left PCA. PCAs mildly irregular but are well perfused to their distal aspects without significant stenosis. Venous sinuses: Grossly patent allowing for timing the contrast bolus. Anatomic variants: Fetal type origin left PCA. No intracranial aneurysm or other vascular abnormality. Review of the MIP images confirms the above findings IMPRESSION: 1. Negative CTA for large vessel occlusion. No high-grade or correctable stenosis identified. 2. Diffuse tortuosity of the major arterial vasculature of the head and neck, suggesting chronic underlying hypertension. 3. Enlarged multinodular right lobe of thyroid with dominant 19 mm right thyroid nodule. Further evaluation with dedicated thyroid ultrasound recommended. This could be performed on a nonemergent  basis.(ref: J Am Coll Radiol. 2015 Feb;12(2): 143-50). Electronically Signed   By: Rise MuBenjamin  McClintock M.D.   On: 09/15/2019 04:06   MR BRAIN WO CONTRAST  Result Date: 09/15/2019 CLINICAL DATA:  Initial evaluation for acute weakness. EXAM: MRI HEAD WITHOUT CONTRAST TECHNIQUE: Multiplanar, multiecho pulse sequences of the brain and surrounding structures were obtained without intravenous contrast. COMPARISON:  Prior CT from 09/14/2019. FINDINGS: Brain: Generalized age-related cerebral atrophy. Patchy and confluent T2/FLAIR hyperintensity within the periventricular deep white matter both cerebral hemispheres most consistent with chronic small vessel ischemic disease, severe in nature. Multiple scatter remote lacunar infarcts seen involving the bilateral basal ganglia, thalami, and periventricular white matter of the corona radiata. Associated chronic hemosiderin staining seen about several of these infarcts. Few scattered remote subcentimeter bilateral cerebellar infarcts noted as well. There is a 11 mm focus of restricted diffusion involving the posterior left basal ganglia, consistent with an acute ischemic perforator type infarct (series 5, image 73). No associated hemorrhage or mass effect. No other evidence for acute or subacute ischemia. No acute intracranial hemorrhage elsewhere within the brain. Multiple scattered chronic micro hemorrhages seen clustered about the, consistent with chronic poorly controlled hypertension. No mass lesion, midline shift or mass effect. No hydrocephalus. No extra-axial fluid collection. Pituitary gland and suprasellar region normal. Midline structures intact. Brainstem, and cerebellum Vascular: Major intracranial vascular flow voids are maintained. Skull and upper cervical spine: Craniocervical junction within normal limits. Bone marrow signal intensity normal. No scalp soft tissue abnormality. Sinuses/Orbits: Globes and orbital soft tissues demonstrate no acute finding.  Paranasal sinuses are largely clear. Trace right mastoid effusion, of doubtful significance. Inner ear structures normal. Other: None. IMPRESSION: 1. 11 mm acute ischemic nonhemorrhagic left basal ganglia infarct. 2. Underlying age-related cerebral atrophy with severe chronic microvascular ischemic disease, with multiple remote lacunar infarcts  as above. 3. Multiple scattered chronic micro hemorrhages clustered about the deep gray nuclei, brainstem, and cerebellum, consistent with chronic poorly controlled hypertension. Electronically Signed   By: Rise Mu M.D.   On: 09/15/2019 01:31   DG Chest Portable 1 View  Result Date: 09/15/2019 CLINICAL DATA:  CVA. Slurred speech. Right arm and leg weakness. EXAM: PORTABLE CHEST 1 VIEW COMPARISON:  Radiograph of 10/01/2018 FINDINGS: Borderline cardiomegaly stable from prior. Unchanged mediastinal contours with aortic tortuosity. Minor bibasilar atelectasis. No pulmonary edema, pleural fluid or pneumothorax. No acute osseous abnormalities are seen. IMPRESSION: 1. Borderline cardiomegaly, stable from prior. 2. Minor bibasilar atelectasis. Electronically Signed   By: Narda Rutherford M.D.   On: 09/15/2019 00:53   ECHOCARDIOGRAM COMPLETE  Result Date: 09/15/2019   ECHOCARDIOGRAM REPORT   Patient Name:   ASHYA NICOLAISEN Date of Exam: 09/15/2019 Medical Rec #:  [id number]        Height:       61.0 in Accession #:    [id number]       Weight:       200.0 lb Date of Birth:  October 01, 1955        BSA:          1.89 m Patient Age:    63 years         BP:           116/79 mmHg Patient Gender: F                HR:           78 bpm. Exam Location:  Inpatient Procedure: 2D Echo Indications:    Stroke 434.91 / I163.9  History:        Patient has no prior history of Echocardiogram examinations.                 Stroke; Risk Factors:Hypertension and Non-Smoker. Elevated                 Troponin, PTSD, Acute renal injury.  Sonographer:    Jeryl Columbia Referring Phys: [phone]  KAREN M BLACK IMPRESSIONS  1. Left ventricular ejection fraction, by visual estimation, is 65 to 70%. The left ventricle has normal function. There is mildly increased left ventricular hypertrophy.  2. Small left ventricular internal cavity size.  3. The left ventricle has no regional wall motion abnormalities.  4. Global right ventricle has normal systolic function.The right ventricular size is normal. No increase in right ventricular wall thickness.  5. Left atrial size was normal.  6. Right atrial size was normal.  7. Presence of pericardial fat pad.  8. Trivial pericardial effusion is present.  9. The mitral valve is grossly normal. No evidence of mitral valve regurgitation. 10. The tricuspid valve is grossly normal. Tricuspid valve regurgitation is trivial. 11. The aortic valve was not well visualized. Aortic valve regurgitation is not visualized. No evidence of aortic valve sclerosis or stenosis. 12. The pulmonic valve was not well visualized. Pulmonic valve regurgitation is not visualized. 13. TR signal is inadequate for assessing pulmonary artery systolic pressure. 14. The inferior vena cava is normal in size with greater than 50% respiratory variability, suggesting right atrial pressure of 3 mmHg. 15. No prior Echocardiogram. FINDINGS  Left Ventricle: Left ventricular ejection fraction, by visual estimation, is 65 to 70%. The left ventricle has normal function. The left ventricle has no regional wall motion abnormalities. The left ventricular internal cavity size was the LV cavity size is small. There is  mildly increased left ventricular hypertrophy. Concentric left ventricular hypertrophy. Left ventricular diastolic parameters are consistent with age-related delayed relaxation (normal). Normal left atrial pressure. Right Ventricle: The right ventricular size is normal. No increase in right ventricular wall thickness. Global RV systolic function is has normal systolic function. Left Atrium: Left atrial size  was normal in size. Right Atrium: Right atrial size was normal in size Pericardium: Trivial pericardial effusion is present. Presence of pericardial fat pad. Mitral Valve: The mitral valve is grossly normal. No evidence of mitral valve regurgitation. Tricuspid Valve: The tricuspid valve is grossly normal. Tricuspid valve regurgitation is trivial. Aortic Valve: The aortic valve was not well visualized. Aortic valve regurgitation is not visualized. The aortic valve is structurally normal, with no evidence of sclerosis or stenosis. Pulmonic Valve: The pulmonic valve was not well visualized. Pulmonic valve regurgitation is not visualized. Pulmonic regurgitation is not visualized. Aorta: The aortic root is normal in size and structure. Venous: The inferior vena cava is normal in size with greater than 50% respiratory variability, suggesting right atrial pressure of 3 mmHg. IAS/Shunts: No atrial level shunt detected by color flow Doppler.  LEFT VENTRICLE PLAX 2D LVIDd:         3.20 cm  Diastology LVIDs:         2.10 cm  LV e' lateral:   8.27 cm/s LV PW:         1.40 cm  LV E/e' lateral: 6.5 LV IVS:        1.50 cm  LV e' medial:    6.96 cm/s LVOT diam:     1.90 cm  LV E/e' medial:  7.8 LV SV:         27 ml LV SV Index:   13.13 LVOT Area:     2.84 cm  RIGHT VENTRICLE TAPSE (M-mode): 2.4 cm LEFT ATRIUM           Index       RIGHT ATRIUM           Index LA diam:      4.10 cm 2.17 cm/m  RA Area:     14.90 cm LA Vol (A2C): 33.3 ml 17.63 ml/m RA Volume:   35.60 ml  18.85 ml/m LA Vol (A4C): 39.2 ml 20.75 ml/m   AORTA Ao Root diam: 2.90 cm MITRAL VALVE MV Area (PHT): 4.12 cm             SHUNTS MV PHT:        53.46 msec           Systemic Diam: 1.90 cm MV Decel Time: 184 msec MV E velocity: 54.15 cm/s 103 cm/s MV A velocity: 61.60 cm/s 70.3 cm/s MV E/A ratio:  0.88       1.5  Eleonore Chiquito MD Electronically signed by Eleonore Chiquito MD Signature Date/Time: 09/15/2019/10:39:30 AM    Final     PHYSICAL EXAM Temp:  [98 F  (36.7 C)-98.9 F (37.2 C)] 98 F (36.7 C) (12/16 0744) Pulse Rate:  [56-78] 62 (12/16 1018) Resp:  [13-24] 18 (12/16 0900) BP: (93-168)/(62-130) 93/62 (12/16 0930) SpO2:  [91 %-100 %] 96 % (12/16 0744)  General - Well nourished, well developed, in no apparent distress.  Ophthalmologic - fundi not visualized due to noncooperation.  Cardiovascular - Regular rhythm and rate.  Mental Status -  Level of arousal and orientation to time, place, and person were intact. Language including expression, naming, repetition, comprehension was assessed and found intact. Mild dysarthria [employer] was assessed and was intact.  Cranial Nerves II - XII - II - Visual field intact OU. III, IV, VI - Extraocular movements intact. V - Facial sensation intact bilaterally. VII - right facial droop. VIII - Hearing & vestibular intact bilaterally. X - Palate elevates symmetrically mild dysarthria. XI - Chin turning & shoulder shrug intact bilaterally. XII - Tongue protrusion intact.  Motor Strength - The patient's strength was normal in left UE and LE, however, RUE proximal 3-/5, bicep and tricep 2/5, finger grip 2/5, RLE proximal 3+/5, distal 4+/5. extremities and pronator drift was absent.  Bulk was normal and fasciculations were absent.   Motor Tone - Muscle tone was assessed at the neck and appendages and was increased on the right.  Reflexes - The patient's reflexes were symmetrical in all extremities and she had no pathological reflexes.  Sensory - Light touch, temperature/pinprick were assessed and were symmetrical.    Coordination - The patient had normal movements in the left hand with no ataxia or dysmetria.  Tremor was absent.  Gait and Station - deferred.  ASSESSMENT/PLAN Ms. DARRIONA DEHAAS is a 63 y.o. female with history of stroke with residual right-sided weakness, goiter with thyroid surgery at the age of 38, memory problems for the past few years, depression,  anxiety, concern for seizure-like activity in 2016-not on any antiepileptics, now presenting with R sided weakness, facial droop and slurred speech.   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 Small vessel disease  MRI brain L CR infarct. Small vessel disease. Atrophy. Multiple old lacunes and micro hemorrhages.  CTA head & neck no ELVO. Diffuse vessel tortuosity. Enlarged multinodular R thryoid lobe w/ [place] recommended.   2D Echo EF 65-70%. No source of embolus   LDL 114  HgbA1c 5.2  Lovenox 40 mg sq daily for VTE prophylaxis  aspirin 325 mg daily prior to admission, now on aspirin 81 mg daily and clopidogrel 75 mg daily. Continue DAPT for 3 weeks and then plavix alone.  Therapy recommendations:  SNF  Disposition:  pending   Hypertension  Stable . Permissive hypertension (OK if < 220/120) but gradually normalize in 3-5 days . Long-term BP goal normotensive  Hyperlipidemia  Home meds:  No statin  Now on lipitor 40  LDL 114, goal < 70  Continue statin at discharge  Other Stroke Risk Factors  Advanced age  ETOH use, advised to drink no more than 1 drink(s) a day  Hx polysubstance abuse (cocaine and opiates in 2016) UDS this admission:  THC POSITIVE, Opiates POSITIVE, Benzos POSITIVE. Patient advised to stop using THC due to stroke risk.  Obesity, recommend weight loss, diet and exercise as appropriate   Hx stroke/TIA  Hx reported stroke in 2005. Multiple MRIs with old strokes but none show acute lesions until now. Residue right side weakness at baseline.  Other Active Problems  Baseline cognitive decline  Hypokalemia  Depression, anxiety, mood disorder  R thyroid lobe enlargement w/ [place] recommended  ?? Seizure like activity - follow with Dr. Karel Jarvis at [facility] day # 0  Neurology will sign off. Please call with questions. Pt will follow up with Dr. Karel Jarvis at [facility] in about 4 weeks. Thanks for the  consult.  Marvel Plan, MD PhD Stroke Neurology 09/15/2019 3:43 PM    To contact Stroke Continuity provider, please refer to [email or web address]. After hours, contact General Neurology

## 2019-09-15 NOTE — H&P (Addendum)
History and Physical    Bailey [name] ZOX:[id number] DOB: [birth year] DOA: 09/14/2019  PCP: Georgann HousekeeperHusain, Karrar, MD Patient coming from: Home  Chief Complaint: Right-sided weakness  HPI: Bailey [name] is a 63 y.o. female with medical history significant of hypertension, prior stroke with residual right-sided weakness presenting with complaints of slurred speech and worsening right-sided weakness.  Last known normal 12/13 when patient went to bed.  Patient states she has residual right-sided weakness from her prior stroke but for the past 2 days it has been worse.  She has also had slurring of speech.  States she takes a long list of medications but does not seem to recognize the name aspirin.  No other complaints.  ED Course: CT head negative for acute intracranial abnormality.  Brain MRI pending.  Neurology consulted.  Patient received aspirin 324 mg.  Review of Systems:  All systems reviewed and apart from history of presenting illness, are negative.  Past Medical History:  Diagnosis Date  . Anxiety    "Breakdown" in the 1990's, hosp. for >1 wk. at [facility]    . Anxiety   . Arthritis    OA- R knee, L hip   . Asthma    years ago. "no problems in a long while."  . Constipation   . Depression   . GERD (gastroesophageal reflux disease)   . Goiter    removed  at age 63  . Hypertension    all BP meds. managed by Dr. Eula ListenHussain    . Hypertension   . Incontinence of urine    weak bladder  . Pneumonia    [facility] told that she had pneumonia & was isolated   . Stroke (HCC)    2005, R sided weakness   . Stroke [facility](HCC) 2005   ? weakness in hand and arms per mother. patient unsure    Past Surgical History:  Procedure Laterality Date  . ABDOMINAL HYSTERECTOMY    . COLONOSCOPY    . DILATION AND CURETTAGE OF UTERUS     1980's  . goiter     removed age 63  . JOINT REPLACEMENT  10/2011   Rt knee  . JOINT REPLACEMENT Right    knee  . THYROIDECTOMY, PARTIAL    .  TONSILLECTOMY    . TOTAL HIP ARTHROPLASTY  12/23/2011   Procedure: TOTAL HIP ARTHROPLASTY;  Surgeon: Nestor LewandowskyFrank J Rowan, MD;  Location: [facility] OR;  Service: Orthopedics;  Laterality: Left;  DEPUY/PINNACLE CUPS/SROM STEM  . TOTAL HIP ARTHROPLASTY Left   . TOTAL HIP ARTHROPLASTY Right 02/15/2015   Procedure: TOTAL HIP ARTHROPLASTY;  Surgeon: Gean BirchwoodFrank Rowan, MD;  Location: [facility] OR;  Service: Orthopedics;  Laterality: Right;  . TOTAL KNEE ARTHROPLASTY  10/21/2011   Procedure: TOTAL KNEE ARTHROPLASTY;  Surgeon: Nestor LewandowskyFrank J Rowan, MD;  Location: [facility] OR;  Service: Orthopedics;  Laterality: Right;  DEPUY SIGMA RP  . TUBAL LIGATION       reports that she has never smoked. She has never used smokeless tobacco. She reports current alcohol use. She reports that she does not use drugs.  Allergies  Allergen Reactions  . Codeine Nausea Only and Other (See Comments)    headaches  . Sulfa Antibiotics Nausea Only    Upset stomach, headaches  . Sulfa Drugs Cross Reactors Other (See Comments)    Its been a long time ago    Family History  Problem Relation Age of Onset  . Anesthesia problems Neg Hx   . Hypotension  Neg Hx   . Malignant hyperthermia Neg Hx   . Pseudochol deficiency Neg Hx   . Alcohol abuse Neg Hx   . Anxiety disorder Neg Hx   . Bipolar disorder Neg Hx   . Depression Neg Hx   . Dementia Neg Hx   . Breast cancer Neg Hx     Prior to Admission medications   Medication Sig Start Date End Date Taking? Authorizing Provider  acetaminophen (TYLENOL) 325 MG tablet Take 2 tablets (650 mg total) by mouth every 6 (six) hours as needed for mild pain (or Fever >/= 101). 05/18/15   Esperanza Sheets, MD  amLODipine (NORVASC) 2.5 MG tablet Take 2.5 mg by mouth daily.    [provider]  aspirin EC 325 MG tablet Take 1 tablet (325 mg total) by mouth 2 (two) times daily. 02/17/15   Allena Katz, PA-C  Cholecalciferol (VITAMIN D) 2000 UNITS tablet Take 2,000 Units by mouth daily.    [provider]    citalopram (CELEXA) 40 MG tablet Take 40 mg by mouth daily.     [provider]  clonazePAM (KLONOPIN) 0.5 MG tablet Take 0.5 mg by mouth 2 (two) times daily.    [provider]  cloNIDine (CATAPRES) 0.3 MG tablet Take 0.3 mg by mouth 2 (two) times daily.    [provider]  Cyanocobalamin (VITAMIN B 12 PO) Take by mouth.    [provider]  doxazosin (CARDURA) 8 MG tablet Take 8 mg by mouth at bedtime.    [provider]  Flaxseed, Linseed, (FLAX SEED OIL PO) Take 1,200 mg by mouth daily.    [provider]  losartan (COZAAR) 100 MG tablet Take 100 mg by mouth daily.    [provider]  metoprolol (TOPROL-XL) 100 MG 24 hr tablet Take 100 mg by mouth daily.     [provider]  omeprazole (PRILOSEC) 20 MG capsule Take 40 mg by mouth daily.    [provider]  potassium chloride (KLOR-CON) 20 MEQ packet Take by mouth 2 (two) times daily.    [provider]  QUEtiapine (SEROQUEL) 50 MG tablet Take 1 tablet (50 mg total) by mouth at bedtime. 09/01/14 03/11/17  Oletta Darter, MD  vitamin C (ASCORBIC ACID) 500 MG tablet Take 500 mg by mouth daily.    [provider]    Physical Exam: Vitals:   09/15/19 0015 09/15/19 0115 09/15/19 0201 09/15/19 0259  BP: (!) 137/96 133/88 140/86 (!) 142/82  Pulse: 76 69 70 74  Resp: Temp:      TempSrc:      SpO2: 91% 93% 96% 96%    Physical Exam  Constitutional: She is oriented to person, place, and time. She appears well-developed and well-nourished. No distress.  HENT:  Head: Normocephalic.  Eyes: EOM are normal. Right eye exhibits no discharge. Left eye exhibits no discharge.  Cardiovascular: Normal rate, regular rhythm and intact distal pulses.  Pulmonary/Chest: Effort normal. No respiratory distress. She has no wheezes. She has no rales.  Abdominal: Soft. Bowel sounds are normal. She exhibits no distension.  Musculoskeletal:         General: No edema.     Cervical back: Neck supple.  Neurological: She is alert and oriented to person, place, and time.  Right facial droop Dysarthria Unable to raise her right upper extremity and right lower extremity against gravity.  Right hand grip strength 2 out of 5 and strength  upon plantarflexion of right foot 3 out of 5. Strength 5 out of 5 in the left upper and lower extremities. Sensation to light touch intact throughout.  Skin: Skin is warm and dry. She is not diaphoretic.     Labs on Admission: I have personally reviewed following labs and imaging studies  CBC: Recent Labs  Lab 09/14/19 1126  WBC 3.9*  NEUTROABS 2.2  HGB 12.6  HCT 35.5*  MCV 82.6  PLT 266   Basic Metabolic Panel: Recent Labs  Lab 09/14/19 1126  NA 143  K 3.0*  CL 102  CO2 30  GLUCOSE 96  BUN 15  CREATININE 0.94  CALCIUM 8.6*   GFR: CrCl cannot be calculated (Unknown ideal weight.). Liver Function Tests: Recent Labs  Lab 09/14/19 1126  AST 17  ALT 15  ALKPHOS 95  BILITOT 1.2  PROT 6.8  ALBUMIN 3.5   No results for input(s): LIPASE, AMYLASE in the last 168 hours. No results for input(s): AMMONIA in the last 168 hours. Coagulation Profile: Recent Labs  Lab 09/14/19 1126  INR 1.0   Cardiac Enzymes: No results for input(s): CKTOTAL, CKMB, CKMBINDEX, TROPONINI in the last 168 hours. BNP (last 3 results) No results for input(s): PROBNP in the last 8760 hours. HbA1C: No results for input(s): HGBA1C in the last 72 hours. CBG: No results for input(s): GLUCAP in the last 168 hours. Lipid Profile: No results for input(s): CHOL, HDL, LDLCALC, TRIG, CHOLHDL, LDLDIRECT in the last 72 hours. Thyroid Function Tests: No results for input(s): TSH, T4TOTAL, FREET4, T3FREE, THYROIDAB in the last 72 hours. Anemia Panel: No results for input(s): VITAMINB12, FOLATE, FERRITIN, TIBC, IRON, RETICCTPCT in the last 72 hours. Urine analysis:    Component Value Date/Time   COLORURINE YELLOW  09/30/2018 0204   APPEARANCEUR CLEAR 09/30/2018 0204   LABSPEC 1.024 09/30/2018 0204   PHURINE 5.0 09/30/2018 0204   GLUCOSEU NEGATIVE 09/30/2018 0204   HGBUR NEGATIVE 09/30/2018 0204   BILIRUBINUR NEGATIVE 09/30/2018 0204   KETONESUR NEGATIVE 09/30/2018 0204   PROTEINUR NEGATIVE 09/30/2018 0204   UROBILINOGEN 2.0 (H) 05/17/2015 1516   NITRITE NEGATIVE 09/30/2018 0204   LEUKOCYTESUR NEGATIVE 09/30/2018 0204    Radiological Exams on Admission: CT HEAD WO CONTRAST  Result Date: 09/14/2019 CLINICAL DATA:  Slurred speech, right-sided weakness. EXAM: CT HEAD WITHOUT CONTRAST TECHNIQUE: Contiguous axial images were obtained from the base of the skull through the vertex without intravenous contrast. COMPARISON:  Feb 26, 2019. FINDINGS: Brain: Mild chronic ischemic white matter disease is noted. No mass effect or midline shift is noted. Ventricular size is within normal limits. There is no evidence of mass lesion, hemorrhage or acute infarction. Vascular: No hyperdense vessel or unexpected calcification. Skull: Normal. Negative for fracture or focal lesion. Sinuses/Orbits: No acute finding. Other: None. IMPRESSION: Mild chronic ischemic white matter disease. No acute intracranial abnormality seen. Electronically Signed   By: Lupita Raider M.D.   On: 09/14/2019 11:49   MR BRAIN WO CONTRAST  Result Date: 09/15/2019 CLINICAL DATA:  Initial evaluation for acute weakness. EXAM: MRI HEAD WITHOUT CONTRAST TECHNIQUE: Multiplanar, multiecho pulse sequences of the brain and surrounding structures were obtained without intravenous contrast. COMPARISON:  Prior CT from 09/14/2019. FINDINGS: Brain: Generalized age-related cerebral atrophy. Patchy and confluent T2/FLAIR hyperintensity within the periventricular deep white matter both cerebral hemispheres most consistent with chronic small vessel ischemic disease, severe in nature. Multiple scatter remote lacunar infarcts seen involving the bilateral basal ganglia,  thalami, and periventricular white matter of the  corona radiata. Associated chronic hemosiderin staining seen about several of these infarcts. Few scattered remote subcentimeter bilateral cerebellar infarcts noted as well. There is a 11 mm focus of restricted diffusion involving the posterior left basal ganglia, consistent with an acute ischemic perforator type infarct (series 5, image 73). No associated hemorrhage or mass effect. No other evidence for acute or subacute ischemia. No acute intracranial hemorrhage elsewhere within the brain. Multiple scattered chronic micro hemorrhages seen clustered about the, consistent with chronic poorly controlled hypertension. No mass lesion, midline shift or mass effect. No hydrocephalus. No extra-axial fluid collection. Pituitary gland and suprasellar region normal. Midline structures intact. Brainstem, and cerebellum Vascular: Major intracranial vascular flow voids are maintained. Skull and upper cervical spine: Craniocervical junction within normal limits. Bone marrow signal intensity normal. No scalp soft tissue abnormality. Sinuses/Orbits: Globes and orbital soft tissues demonstrate no acute finding. Paranasal sinuses are largely clear. Trace right mastoid effusion, of doubtful significance. Inner ear structures normal. Other: None. IMPRESSION: 1. 11 mm acute ischemic nonhemorrhagic left basal ganglia infarct. 2. Underlying age-related cerebral atrophy with severe chronic microvascular ischemic disease, with multiple remote lacunar infarcts as above. 3. Multiple scattered chronic micro hemorrhages clustered about the deep gray nuclei, brainstem, and cerebellum, consistent with chronic poorly controlled hypertension. Electronically Signed   By: Jeannine Boga M.D.   On: 09/15/2019 01:31   DG Chest Portable 1 View  Result Date: 09/15/2019 CLINICAL DATA:  CVA. Slurred speech. Right arm and leg weakness. EXAM: PORTABLE CHEST 1 VIEW COMPARISON:  Radiograph of  10/01/2018 FINDINGS: Borderline cardiomegaly stable from prior. Unchanged mediastinal contours with aortic tortuosity. Minor bibasilar atelectasis. No pulmonary edema, pleural fluid or pneumothorax. No acute osseous abnormalities are seen. IMPRESSION: 1. Borderline cardiomegaly, stable from prior. 2. Minor bibasilar atelectasis. Electronically Signed   By: Keith Rake M.D.   On: 09/15/2019 00:53    EKG: Independently reviewed.  Sinus rhythm, nonspecific T wave abnormality.  Assessment/Plan Principal Problem:   Acute CVA (cerebrovascular accident) ([place]) Active Problems:   GAD (generalized anxiety disorder)   Hypokalemia   Hypertension   Depression   Acute ischemic stroke Patient with history of prior stroke with residual right-sided deficits presenting with complaints of worsening right-sided weakness and slurred speech.  Last known normal 12/13 at bedtime.  Out of TPA window. CT head negative for acute intracranial abnormality.  Brain MRI showing a 11 mm acute ischemic nonhemorrhagic left basal ganglia infarct.  Patient received aspirin 324 mg. -Telemetry monitoring -Outside permissive hypertension window, okay to normalize blood pressure -CTA head and neck -2D echocardiogram -Hemoglobin A1c, fasting lipid panel -Aspirin 325 p.o. or 300 mg PR daily -Atorvastatin 80 mg now and daily -Frequent neurochecks -PT, OT, speech therapy. -N.p.o. until cleared by bedside swallow evaluation or formal speech evaluation -Neurology following, appreciate recommendations -Check UDS per neurology recommendation  Hypokalemia Potassium 3.0.  Not on a diuretic.  Denies vomiting or diarrhea. -Replete potassium.  Check magnesium level and replete if low.  Continue to monitor electrolytes.  Hypertension Currently normotensive. -Continue home amlodipine, clonidine, doxazosin, losartan  Depression, anxiety, mood disorder -Continue home Celexa -Continue home Klonopin as needed -Continue home  Seroquel  DVT prophylaxis: Lovenox Code Status: Full code.  Discussed with the patient. Family Communication: No family available at this time. Disposition Plan: Anticipate discharge after clinical improvement. Consults called: Neurology Admission status: It is my clinical opinion that admission to INPATIENT is reasonable and necessary in this 63 y.o. female . presenting with right-sided weakness and dysarthria concerning  for acute ischemic stroke.  Needs stroke work-up.  Given the aforementioned, the predictability of an adverse outcome is felt to be significant. I expect that the patient will require at least 2 midnights in the hospital to treat this condition.   The medical decision making on this patient was of high complexity and the patient is at high risk for clinical deterioration, therefore this is a level 3 visit.  John Giovanni MD [facility] Pager [phone]  If 7PM-7AM, please contact night-coverage [email or web address] Password [email or web address]  09/15/2019, 3:01 AM

## 2019-09-15 NOTE — Evaluation (Signed)
Occupational Therapy Evaluation Patient Details Name: Bailey DarkMarguerite B Lybeck MRN: [id number] DOB: 08-09-1956 Today's Date: 09/15/2019    History of Present Illness Bailey [name] is a 63 y.o. female with medical history significant of hypertension, prior stroke with residual right-sided weakness presenting with complaints of slurred speech and worsening right-sided weakness. MRI showing an 11 mm acute ischemic nonhemorrhagic left basal ganglia infarct.   Clinical Impression   Pt admitted with the above diagnoses and presents with below problem list. Pt will benefit from continued acute OT to address the below listed deficits and maximize independence with basic ADLs prior to d/c to venue below. PTA pt was mod I with most ADLs, some assistance needed for bathing. Pt lives alone but has an aide who comes 3x/wk for 3 hours per visit. Pt presents with acute on residual R side hemiplegia. Pt currently max A with UB ADLs in a seated position, able to sit EOB at supervision- min guard level (no physical assist in unsupported sitting); max +2 physical A with LB ADLs and functional transfer. Pt ultimately completing EOB to recliner transfer as squat-pivot with +2 physical A. Recommend nursing consider utilizing drop arm function on recliner, bed pad under pt to facilitate powering hips up and over, and transferring to left (strong) side.      Follow Up Recommendations  SNF;Supervision/Assistance - 24 hour    Equipment Recommendations  Other (comment)(TBD next venue)    Recommendations for Other Services       Precautions / Restrictions Precautions Precautions: Fall Precaution Comments: R hemiplegia Restrictions Weight Bearing Restrictions: No      Mobility Bed Mobility Overal bed mobility: Needs Assistance Bed Mobility: Supine to Sit     Supine to sit: Min assist;Mod assist     General bed mobility comments: Pt with compensatory technique, using LUE to assist RUE over to railing in order  to grip. Very slow and effortful. Min - mod A for trunk facilitation to upright and use of bed pad to scoot hips forward.  Transfers Overall transfer level: Needs assistance Equipment used: 2 person hand held assist Transfers: Sit to/from Stand;Squat Pivot Transfers;Stand Pivot Transfers Sit to Stand: Max assist;+2 physical assistance;From elevated surface Stand pivot transfers: Max assist;+2 physical assistance Squat pivot transfers: Max assist;+2 physical assistance;From elevated surface     General transfer comment: Max +2 A to stand. Difficulty maintaining upright midline stance. Difficulty weightshifting in stnading with +2 HHA. Began transfer as stand pivot but ultimately completed as squat pivot with heavy use of bed pad under pt to advance hips into recliner.     Balance Overall balance assessment: Needs assistance Sitting-balance support: Feet supported;No upper extremity supported Sitting balance-Leahy Scale: Fair Sitting balance - Comments: Able to sit unsupported with supervision   Standing balance support: Bilateral upper extremity supported Standing balance-Leahy Scale: Poor Standing balance comment: reliant on external support. may benefit from R platform walker?                           ADL either performed or assessed with clinical judgement   ADL Overall ADL's : Needs assistance/impaired Eating/Feeding: Minimal assistance;Sitting   Grooming: Sitting;Maximal assistance   Upper Body Bathing: Maximal assistance;Sitting   Lower Body Bathing: Maximal assistance;+2 for physical assistance;Sitting/lateral leans;Sit to/from stand   Upper Body Dressing : Maximal assistance;Sitting   Lower Body Dressing: Maximal assistance;+2 for physical assistance;Sitting/lateral leans;Sit to/from stand   Toilet Transfer: Maximal assistance;+2 for physical assistance;Squat-pivot;BSC;RW;Requires drop arm  Toileting- Clothing Manipulation and Hygiene: Maximal  assistance;+2 for physical assistance;Sit to/from stand;Sitting/lateral lean         General ADL Comments: Pt completed bed mobility, sat EOB several minutes then stood with +2 max A. Difficulty weightshifting in standing. Ultimately transferred to recliner as squat-pivot.      Vision         Perception     Praxis      Pertinent Vitals/Pain Pain Assessment: No/denies pain     Hand Dominance     Extremity/Trunk Assessment Upper Extremity Assessment Upper Extremity Assessment: RUE deficits/detail RUE Deficits / Details: grossly 2/5 strength. Acute change from baseline residual R side deficits.  RUE Coordination: decreased fine motor;decreased gross motor   Lower Extremity Assessment Lower Extremity Assessment: Defer to PT evaluation RLE Deficits / Details: Residual right sided deficits from prior stroke; noted increased extensor tone, 1/5 knee extension, 3/5 ankle dorsiflexion LLE Deficits / Details: Grossly 3/5       Communication Communication Communication: Expressive difficulties   Cognition Arousal/Alertness: Awake/alert Behavior During Therapy: WFL for tasks assessed/performed;Flat affect(tearful, frustrated) Overall Cognitive Status: History of cognitive impairments - at baseline                                 General Comments: Pt with cognitive impairments at baseline per chart review. Alert and oriented; noted decreased problem solving skills, also becoming emotionally labile at points due to frustration in her current status   General Comments       Exercises     Shoulder Instructions      Home Living Family/patient expects to be discharged to:: Private residence Living Arrangements: Alone Available Help at Discharge: Family;Friend(s);Available PRN/intermittently Type of Home: Apartment             Bathroom Shower/Tub: Tub/shower unit   Bathroom Toilet: Handicapped height     Home Equipment: Environmental consultant - 4 wheels;Transport  chair;Shower seat;Toilet riser          Prior Functioning/Environment Level of Independence: Needs assistance  Gait / Transfers Assistance Needed: uses Rollator ADL's / Homemaking Assistance Needed: Pt has aide 3x/week, 3 hrs/day who assists with "getting out of bed, errands, cleaning, cooking." Aide also assist with bathing.             OT Problem List: Decreased strength;Decreased range of motion;Decreased activity tolerance;Impaired balance (sitting and/or standing);Decreased coordination;Decreased cognition;Decreased knowledge of use of DME or AE;Decreased knowledge of precautions;Obesity;Impaired UE functional use;Impaired tone;Impaired sensation      OT Treatment/Interventions: Self-care/ADL training;Therapeutic exercise;Neuromuscular education;DME and/or AE instruction;Therapeutic activities;Cognitive remediation/compensation;Patient/family education;Balance training    OT Goals(Current goals can be found in the care plan section) Acute Rehab OT Goals Patient Stated Goal: "live by myself again." OT Goal Formulation: With patient Time For Goal Achievement: 09/29/19 Potential to Achieve Goals: Good ADL Goals Pt Will Perform Eating: with modified independence;sitting Pt Will Perform Grooming: sitting;with mod assist Pt Will Perform Upper Body Bathing: with mod assist;sitting Pt Will Perform Lower Body Bathing: with mod assist;sit to/from stand Pt Will Transfer to Toilet: stand pivot transfer;with mod assist;bedside commode;with +2 assist Pt/caregiver will Perform Home Exercise Program: Right Upper extremity;Increased strength;Increased ROM;With Supervision;With written HEP provided  OT Frequency: Min 2X/week   Barriers to D/C:            Co-evaluation PT/OT/SLP Co-Evaluation/Treatment: Yes Reason for Co-Treatment: For patient/therapist safety;To address functional/ADL transfers   OT goals addressed during session: ADL's and  self-care;Strengthening/ROM      AM-PAC OT  "6 Clicks" Daily Activity     Outcome Measure Help from another person eating meals?: A Little Help from another person taking care of personal grooming?: A Lot Help from another person toileting, which includes using toliet, bedpan, or urinal?: Total Help from another person bathing (including washing, rinsing, drying)?: Total Help from another person to put on and taking off regular upper body clothing?: A Lot Help from another person to put on and taking off regular lower body clothing?: Total 6 Click Score: 10   End of Session Equipment Utilized During Treatment: Gait belt;Other (comment)(+2 HHA) Nurse Communication: Mobility status;Precautions  Activity Tolerance: Patient limited by fatigue;Patient tolerated treatment well;Other (comment)(internally distracted, frustrated and tearful about situatio) Patient left: in chair;with call bell/phone within reach;with chair alarm set  OT Visit Diagnosis: Unsteadiness on feet (R26.81);Other abnormalities of gait and mobility (R26.89);Muscle weakness (generalized) (M62.81);Other symptoms and signs involving cognitive function;Hemiplegia and hemiparesis Hemiplegia - Right/Left: Right Hemiplegia - dominant/non-dominant: Dominant                Time: 1016-1100 OT Time Calculation (min): 44 min Charges:  OT General Charges $OT Visit: 1 Visit OT Evaluation $OT Eval Moderate Complexity: [place], OT Acute Rehabilitation Services Pager: [phone] Office: [phone]   Hortencia Pilar 09/15/2019, 1:45 PM

## 2019-09-15 NOTE — Evaluation (Signed)
Physical Therapy Evaluation Patient Details Name: Bailey [name] MRN: [id number] DOB: 09-21-1956 Today's Date: 09/15/2019   History of Present Illness  Bailey [name] is a 63 y.o. female with medical history significant of hypertension, prior stroke with residual right-sided weakness presenting with complaints of slurred speech and worsening right-sided weakness. MRI showing an 11 mm acute ischemic nonhemorrhagic left basal ganglia infarct.  Clinical Impression  Pt admitted with above. Prior to admission, pt lives alone, is ambulatory with a Rollator, and has caregiver assist 3 days/week. She has residual right sided weakness from prior stroke. On PT evaluation, pt presents with right hemiplegia, RLE extensor tone, balance impairments, and generalized weakness. Pt requiring two person maximal assist for stand pivot transfers and unable to ambulate. Will need post acute rehab to maximize functional independence as pt does not have caregiver support.      Follow Up Recommendations SNF;Supervision/Assistance - 24 hour    Equipment Recommendations  Wheelchair (measurements PT);Hospital bed    Recommendations for Other Services       Precautions / Restrictions Precautions Precautions: Fall Precaution Comments: R hemiplegia Restrictions Weight Bearing Restrictions: No      Mobility  Bed Mobility Overal bed mobility: Needs Assistance Bed Mobility: Supine to Sit     Supine to sit: Min assist     General bed mobility comments: Pt with compensatory technique, using LUE to assist RUE over to railing in order to grip. Very slow and effortful. MinA for trunk facilitation to upright and use of bed pad to scoot hips forward.  Transfers Overall transfer level: Needs assistance Equipment used: None Transfers: Sit to/from [employer] Sit to Stand: Max assist;+2 physical assistance Stand pivot transfers: Max assist;+2 physical assistance       General transfer  comment: MaxA + 2 to stand and pivot towards left with increased time. Noted heavy posterior lean, decreased motor initiation/execution, difficulty weight shifting in order to take steps. Ultimately needing use of bed pad to guide hips over to chair.  Ambulation/Gait                Stairs            Wheelchair Mobility    Modified Rankin (Stroke Patients Only) Modified Rankin (Stroke Patients Only) Pre-Morbid Rankin Score: Moderate disability Modified Rankin: Severe disability     Balance Overall balance assessment: Needs assistance Sitting-balance support: Feet supported;No upper extremity supported Sitting balance-Leahy Scale: Fair Sitting balance - Comments: Able to sit unsupported with supervision   Standing balance support: Bilateral upper extremity supported Standing balance-Leahy Scale: Poor Standing balance comment: reliant on external support                             Pertinent Vitals/Pain Pain Assessment: No/denies pain    Home Living Family/patient expects to be discharged to:: Private residence Living Arrangements: Alone Available Help at Discharge: Family;Friend(s);Available PRN/intermittently Type of Home: Apartment         Home Equipment: Walker - 4 wheels;Transport chair;Shower seat      Prior Function Level of Independence: Needs assistance   Gait / Transfers Assistance Needed: uses Rollator  ADL's / Homemaking Assistance Needed: Pt has aide 3x/week, 3 hrs/day who assists with "getting out of bed, errands, cleaning, cooking."        Hand Dominance        Extremity/Trunk Assessment   Upper Extremity Assessment Upper Extremity Assessment: Defer to OT evaluation  Lower Extremity Assessment Lower Extremity Assessment: RLE deficits/detail;LLE deficits/detail RLE Deficits / Details: Residual right sided deficits from prior stroke; noted increased extensor tone, 1/5 knee extension, 3/5 ankle dorsiflexion LLE Deficits  / Details: Grossly 3/5       Communication   Communication: Expressive difficulties  Cognition Arousal/Alertness: Awake/alert Behavior During Therapy: WFL for tasks assessed/performed Overall Cognitive Status: History of cognitive impairments - at baseline                                 General Comments: Pt with cognitive impairments at baseline per chart review. Alert and oriented; noted decreased problem solving skills, also becoming emotionally labile at points due to frustration in her current status      General Comments      Exercises     Assessment/Plan    PT Assessment Patient needs continued PT services  PT Problem List Decreased strength;Decreased activity tolerance;Decreased balance;Decreased mobility;Decreased cognition;Decreased safety awareness;Impaired tone       PT Treatment Interventions DME instruction;Gait training;Functional mobility training;Therapeutic activities;Therapeutic exercise;Balance training;Patient/family education;Wheelchair mobility training    PT Goals (Current goals can be found in the Care Plan section)  Acute Rehab PT Goals Patient Stated Goal: "live by myself again." PT Goal Formulation: With patient Time For Goal Achievement: 09/29/19 Potential to Achieve Goals: Fair    Frequency Min 3X/week   Barriers to discharge Decreased caregiver support      Co-evaluation               AM-PAC PT "6 Clicks" Mobility  Outcome Measure Help needed turning from your back to your side while in a flat bed without using bedrails?: A Little Help needed moving from lying on your back to sitting on the side of a flat bed without using bedrails?: A Little Help needed moving to and from a bed to a chair (including a wheelchair)?: Total Help needed standing up from a chair using your arms (e.g., wheelchair or bedside chair)?: Total Help needed to walk in hospital room?: Total Help needed climbing 3-5 steps with a railing? :  Total 6 Click Score: 10    End of Session Equipment Utilized During Treatment: Gait belt Activity Tolerance: Patient tolerated treatment well Patient left: in chair;with call bell/phone within reach;with chair alarm set Nurse Communication: Mobility status PT Visit Diagnosis: Other abnormalities of gait and mobility (R26.89);Muscle weakness (generalized) (M62.81);Difficulty in walking, not elsewhere classified (R26.2);Other symptoms and signs involving the nervous system (R29.898)    Time: 1018-1100 PT Time Calculation (min) (ACUTE ONLY): 42 min   Charges:   PT Evaluation $PT Eval Moderate Complexity: 1 Mod PT Treatments $Therapeutic Activity: 8-22 mins       Ellamae Sia, PT, DPT Acute Rehabilitation Services Pager [phone] Office [phone]   Willy Eddy 09/15/2019, 1:06 PM

## 2019-09-15 NOTE — ED Notes (Signed)
Attempted to call report to 3W, was told charge would look over chart and assign it to a RN. Was told to call back in 10 minutes

## 2019-09-15 NOTE — ED Notes (Signed)
ED TO INPATIENT HANDOFF REPORT  ED Nurse Name and Phone #: Mickeal Skinner [phone]  S Name/Age/Gender Bailey [name] 63 y.o. female Room/Bed: 009C/009C  Code Status   Code Status: Full Code  Home/SNF/Other Home Patient oriented to: self, place, time and situation Is this baseline? Yes   Triage Complete: Triage complete  Chief Complaint Neurological deficit present [R29.818] Acute CVA (cerebrovascular accident) [facility]) [I63.9]  Triage Note Pt arrives via POV from home with new onset slurred speech right arm and leg weakness. Pt reports she dizzy. Hx of strokes in the past. LKW Monday. VSS.     Allergies Allergies  Allergen Reactions  . Codeine Nausea Only and Other (See Comments)    headaches  . Sulfa Antibiotics Nausea Only    Upset stomach, headaches  . Sulfa Drugs Cross Reactors Other (See Comments)    Its been a long time ago    Level of Care/Admitting Diagnosis ED Disposition    ED Disposition Condition Comment   Admit  Hospital Area: [facility] [[id number]]  Level of Care: Telemetry Medical [104]  Covid Evaluation: Asymptomatic Screening Protocol (No Symptoms)  Diagnosis: Acute CVA (cerebrovascular accident) [facility] [[id number]]  Admitting Physician: John Giovanni [[id number]]  Attending Physician: John Giovanni [[id number]]  Estimated length of stay: past midnight tomorrow  Certification:: I certify this patient will need inpatient services for at least 2 midnights       B Medical/Surgery History Past Medical History:  Diagnosis Date  . Anxiety    "Breakdown" in the 1990's, hosp. for >1 wk. at [facility]    . Anxiety   . Arthritis    OA- R knee, L hip   . Asthma    years ago. "no problems in a long while."  . Constipation   . Depression   . GERD (gastroesophageal reflux disease)   . Goiter    removed  at age 71  . Hypertension    all BP meds. managed by Dr. Eula Listen    . Hypertension   . Incontinence of urine    weak bladder  .  Pneumonia    while at [facility] told that she had pneumonia & was isolated   . Stroke (HCC)    2005, R sided weakness   . Stroke [facility]) 2005   ? weakness in hand and arms per mother. patient unsure   Past Surgical History:  Procedure Laterality Date  . ABDOMINAL HYSTERECTOMY    . COLONOSCOPY    . DILATION AND CURETTAGE OF UTERUS     1980's  . goiter     removed age 52  . JOINT REPLACEMENT  10/2011   Rt knee  . JOINT REPLACEMENT Right    knee  . THYROIDECTOMY, PARTIAL    . TONSILLECTOMY    . TOTAL HIP ARTHROPLASTY  12/23/2011   Procedure: TOTAL HIP ARTHROPLASTY;  Surgeon: Nestor Lewandowsky, MD;  Location: [facility] OR;  Service: Orthopedics;  Laterality: Left;  DEPUY/PINNACLE CUPS/SROM STEM  . TOTAL HIP ARTHROPLASTY Left   . TOTAL HIP ARTHROPLASTY Right 02/15/2015   Procedure: TOTAL HIP ARTHROPLASTY;  Surgeon: Gean Birchwood, MD;  Location: [facility] OR;  Service: Orthopedics;  Laterality: Right;  . TOTAL KNEE ARTHROPLASTY  10/21/2011   Procedure: TOTAL KNEE ARTHROPLASTY;  Surgeon: Nestor Lewandowsky, MD;  Location: [facility] OR;  Service: Orthopedics;  Laterality: Right;  DEPUY SIGMA RP  . TUBAL LIGATION       A IV Location/Drains/Wounds Patient Lines/Drains/Airways Status   Active Line/Drains/Airways  Name:   Placement date:   Placement time:   Site:   Days:   Peripheral IV 09/14/19 Left Antecubital   09/14/19    2235    Antecubital   1          Intake/Output Last 24 hours No intake or output data in the 24 hours ending 09/15/19 0357  Labs/Imaging Results for orders placed or performed during the hospital encounter of 09/14/19 (from the past 48 hour(s))  Protime-INR     Status: None   Collection Time: 09/14/19 11:26 AM  Result Value Ref Range   Prothrombin Time 13.5 11.4 - 15.2 seconds   INR 1.0 0.8 - 1.2    Comment: (NOTE) INR goal varies based on device and disease states. Performed at [facility], [place] [address], [place], [place] [address]   APTT     Status: None    Collection Time: 09/14/19 11:26 AM  Result Value Ref Range   aPTT 29 24 - 36 seconds    Comment: Performed at [facility] [address], [place], [place] [address]  CBC     Status: Abnormal   Collection Time: 09/14/19 11:26 AM  Result Value Ref Range   WBC 3.9 (L) 4.0 - 10.5 K/uL   RBC 4.30 3.87 - 5.11 MIL/uL   Hemoglobin 12.6 12.0 - 15.0 g/dL   HCT 35.5 (L) 36.0 - 46.0 %   MCV 82.6 80.0 - 100.0 fL   MCH 29.3 26.0 - 34.0 pg   MCHC 35.5 30.0 - 36.0 g/dL   RDW 13.5 11.5 - 15.5 %   Platelets 266 150 - 400 K/uL   nRBC 0.0 0.0 - 0.2 %    Comment: Performed at [facility], [place] [address] [place], [place] [address]  Differential     Status: None   Collection Time: 09/14/19 11:26 AM  Result Value Ref Range   Neutrophils Relative % 55 %   Neutro Abs 2.2 1.7 - 7.7 K/uL   Lymphocytes Relative 29 %   Lymphs Abs 1.2 0.7 - 4.0 K/uL   Monocytes Relative 11 %   Monocytes Absolute 0.4 0.1 - 1.0 K/uL   Eosinophils Relative 4 %   Eosinophils Absolute 0.1 0.0 - 0.5 K/uL   Basophils Relative 1 %   Basophils Absolute 0.0 0.0 - 0.1 K/uL   Immature Granulocytes 0 %   Abs Immature Granulocytes 0.01 0.00 - 0.07 K/uL    Comment: Performed at [facility], [place] [address] [place], [place] [address]  Comprehensive metabolic panel     Status: Abnormal   Collection Time: 09/14/19 11:26 AM  Result Value Ref Range   Sodium 143 135 - 145 mmol/L   Potassium 3.0 (L) 3.5 - 5.1 mmol/L   Chloride 102 98 - 111 mmol/L   CO2 30 22 - 32 mmol/L   Glucose, Bld 96 70 - 99 mg/dL   BUN 15 8 - 23 mg/dL   Creatinine, Ser 0.94 0.44 - 1.00 mg/dL   Calcium 8.6 (L) 8.9 - 10.3 mg/dL   Total Protein 6.8 6.5 - 8.1 g/dL   Albumin 3.5 3.5 - 5.0 g/dL   AST 17 15 - 41 U/L   ALT 15 0 - 44 U/L   Alkaline Phosphatase 95 38 - 126 U/L   Total Bilirubin 1.2 0.3 - 1.2 mg/dL   GFR calc non Af Amer >60 >60 mL/min   GFR calc Af Amer >60 >60 mL/min  Anion gap 11 5 - 15    Comment: Performed at [facility], [address] [place], [place] [address]   CT HEAD WO CONTRAST  Result Date: 09/14/2019 CLINICAL DATA:  Slurred speech, right-sided weakness. EXAM: CT HEAD WITHOUT CONTRAST TECHNIQUE: Contiguous axial images were obtained from the base of the skull through the vertex without intravenous contrast. COMPARISON:  Feb 26, 2019. FINDINGS: Brain: Mild chronic ischemic white matter disease is noted. No mass effect or midline shift is noted. Ventricular size is within normal limits. There is no evidence of mass lesion, hemorrhage or acute infarction. Vascular: No hyperdense vessel or unexpected calcification. Skull: Normal. Negative for fracture or focal lesion. Sinuses/Orbits: No acute finding. Other: None. IMPRESSION: Mild chronic ischemic white matter disease. No acute intracranial abnormality seen. Electronically Signed   By: Lupita RaiderJames  Green Jr M.D.   On: 09/14/2019 11:49   MR BRAIN WO CONTRAST  Result Date: 09/15/2019 CLINICAL DATA:  Initial evaluation for acute weakness. EXAM: MRI HEAD WITHOUT CONTRAST TECHNIQUE: Multiplanar, multiecho pulse sequences of the brain and surrounding structures were obtained without intravenous contrast. COMPARISON:  Prior CT from 09/14/2019. FINDINGS: Brain: Generalized age-related cerebral atrophy. Patchy and confluent T2/FLAIR hyperintensity within the periventricular deep white matter both cerebral hemispheres most consistent with chronic small vessel ischemic disease, severe in nature. Multiple scatter remote lacunar infarcts seen involving the bilateral basal ganglia, thalami, and periventricular white matter of the corona radiata. Associated chronic hemosiderin staining seen about several of these infarcts. Few scattered remote subcentimeter bilateral cerebellar infarcts noted as well. There is a 11 mm focus of restricted diffusion involving the posterior left basal ganglia, consistent with an acute ischemic perforator type infarct (series 5, image 73). No  associated hemorrhage or mass effect. No other evidence for acute or subacute ischemia. No acute intracranial hemorrhage elsewhere within the brain. Multiple scattered chronic micro hemorrhages seen clustered about the, consistent with chronic poorly controlled hypertension. No mass lesion, midline shift or mass effect. No hydrocephalus. No extra-axial fluid collection. Pituitary gland and suprasellar region normal. Midline structures intact. Brainstem, and cerebellum Vascular: Major intracranial vascular flow voids are maintained. Skull and upper cervical spine: Craniocervical junction within normal limits. Bone marrow signal intensity normal. No scalp soft tissue abnormality. Sinuses/Orbits: Globes and orbital soft tissues demonstrate no acute finding. Paranasal sinuses are largely clear. Trace right mastoid effusion, of doubtful significance. Inner ear structures normal. Other: None. IMPRESSION: 1. 11 mm acute ischemic nonhemorrhagic left basal ganglia infarct. 2. Underlying age-related cerebral atrophy with severe chronic microvascular ischemic disease, with multiple remote lacunar infarcts as above. 3. Multiple scattered chronic micro hemorrhages clustered about the deep gray nuclei, brainstem, and cerebellum, consistent with chronic poorly controlled hypertension. Electronically Signed   By: Rise MuBenjamin  McClintock M.D.   On: 09/15/2019 01:31   DG Chest Portable 1 View  Result Date: 09/15/2019 CLINICAL DATA:  CVA. Slurred speech. Right arm and leg weakness. EXAM: PORTABLE CHEST 1 VIEW COMPARISON:  Radiograph of 10/01/2018 FINDINGS: Borderline cardiomegaly stable from prior. Unchanged mediastinal contours with aortic tortuosity. Minor bibasilar atelectasis. No pulmonary edema, pleural fluid or pneumothorax. No acute osseous abnormalities are seen. IMPRESSION: 1. Borderline cardiomegaly, stable from prior. 2. Minor bibasilar atelectasis. Electronically Signed   By: Narda RutherfordMelanie  Sanford M.D.   On: 09/15/2019 00:53     Pending Labs Unresulted Labs (From admission, onward)    Start     Ordered   09/15/19 0500  Hemoglobin A1c  Tomorrow morning,   R  09/15/19 0037   09/15/19 0500  Lipid panel  Tomorrow morning,   R    Comments: Fasting    09/15/19 0037   09/15/19 0500  Basic metabolic panel  Tomorrow morning,   R     09/15/19 0037   09/15/19 0236  Urine rapid drug screen (hosp performed)  ONCE - STAT,   STAT     09/15/19 0236   09/15/19 0037  Magnesium  Add-on,   AD     09/15/19 0037   09/15/19 0036  HIV Antibody (routine testing w rflx)  (HIV Antibody (Routine testing w reflex) panel)  Once,   STAT     09/15/19 0037   09/15/19 0009  SARS CORONAVIRUS 2 (TAT 6-24 HRS) Nasopharyngeal Nasopharyngeal Swab  (Tier 3 (TAT 6-24 hrs))  Once,   STAT    Question Answer Comment  Is this test for diagnosis or screening Screening   Symptomatic for COVID-19 as defined by CDC No   Hospitalized for COVID-19 No   Admitted to ICU for COVID-19 No   Previously tested for COVID-19 No   Resident in a congregate (group) care setting Unknown   Employed in healthcare setting Unknown   Pregnant No      09/15/19 0008   09/15/19 0008  Urinalysis, Routine w reflex microscopic  Once,   STAT     09/15/19 0007          Vitals/Pain Today's Vitals   09/15/19 0015 09/15/19 0115 09/15/19 0201 09/15/19 0259  BP: (!) 137/96 133/88 140/86 (!) 142/82  Pulse: 76 69 70 74  Resp: Temp:      TempSrc:      SpO2: 91% 93% 96% 96%  PainSc:   5      Isolation Precautions No active isolations  Medications Medications  sodium chloride flush (NS) 0.9 % injection 3 mL (has no administration in time range)  morphine 4 MG/ML injection 4 mg (has no administration in time range)  citalopram (CELEXA) tablet 40 mg (has no administration in time range)  QUEtiapine (SEROQUEL) tablet 50 mg (50 mg Oral Given 09/15/19 0327)  pantoprazole (PROTONIX) EC tablet 40 mg (has no administration in time range)  clonazePAM  (KLONOPIN) tablet 0.5 mg (has no administration in time range)  cholecalciferol (VITAMIN D3) tablet 2,000 Units (has no administration in time range)  ascorbic acid (VITAMIN C) tablet 500 mg (has no administration in time range)   stroke: mapping our early stages of recovery book (has no administration in time range)  acetaminophen (TYLENOL) tablet 650 mg (has no administration in time range)    Or  acetaminophen (TYLENOL) 160 MG/5ML solution 650 mg (has no administration in time range)    Or  acetaminophen (TYLENOL) suppository 650 mg (has no administration in time range)  enoxaparin (LOVENOX) injection 40 mg (has no administration in time range)  aspirin tablet 325 mg (has no administration in time range)  amLODipine (NORVASC) tablet 2.5 mg (has no administration in time range)  cloNIDine (CATAPRES) tablet 0.3 mg (0.3 mg Oral Given 09/15/19 0321)  doxazosin (CARDURA) tablet 8 mg (has no administration in time range)  losartan (COZAAR) tablet 100 mg (has no administration in time range)  metoprolol succinate (TOPROL-XL) 24 hr tablet 100 mg (has no administration in time range)  potassium chloride SA (KLOR-CON) CR tablet 40 mEq (40 mEq Oral Given 09/14/19 2123)  aspirin chewable tablet 324 mg (324 mg Oral Given 09/14/19 2123)  LORazepam (ATIVAN) injection 1  mg (1 mg Intravenous Given 09/14/19 2239)  morphine 4 MG/ML injection 4 mg (4 mg Intravenous Given 09/14/19 2247)  ondansetron (ZOFRAN) injection 4 mg (4 mg Intravenous Given 09/14/19 2245)  potassium chloride SA (KLOR-CON) CR tablet 40 mEq (40 mEq Oral Given 09/15/19 0327)  iohexol (OMNIPAQUE) 350 MG/ML injection 80 mL (80 mLs Intravenous Contrast Given 09/15/19 0312)    Mobility non-ambulatory Moderate fall risk   Focused Assessments Neuro Assessment Handoff:  Swallow screen pass? Yes    NIH Stroke Scale ( + Modified Stroke Scale Criteria)  Interval: Shift assessment Level of Consciousness (1a.)   : Alert, keenly  responsive LOC Questions (1b. )   +: Answers both questions correctly LOC Commands (1c. )   + : Performs both tasks correctly Best Gaze (2. )  +: Normal Visual (3. )  +: No visual loss Facial Palsy (4. )    : Minor paralysis Motor Arm, Left (5a. )   +: No drift Motor Arm, Right (5b. )   +: Some effort against gravity Motor Leg, Left (6a. )   +: No drift Motor Leg, Right (6b. )   +: Some effort against gravity Limb Ataxia (7. ): Absent Sensory (8. )   +: Normal, no sensory loss Best Language (9. )   +: No aphasia Dysarthria (10. ): Mild-to-moderate dysarthria, patient slurs at least some words and, at worst, can be understood with some difficulty Extinction/Inattention (11.)   +: No Abnormality Modified SS Total  +: 4 Complete NIHSS TOTAL: 6     Neuro Assessment: Exceptions to WDL Neuro Checks:   Initial (09/15/19 0111)  Last Documented NIHSS Modified Score: 4 (09/15/19 0137) Has TPA been given? No If patient is a Neuro Trauma and patient is going to OR before floor call report to 4N Charge nurse: [phone] or [phone]     R Recommendations: See Admitting Provider Note  Report given to:   Additional Notes:

## 2019-09-15 NOTE — Plan of Care (Signed)
  Problem: Education: Goal: Knowledge of disease or condition will improve Outcome: Progressing Goal: Knowledge of secondary prevention will improve Outcome: Progressing Goal: Knowledge of patient specific risk factors addressed and post discharge goals established will improve Outcome: Progressing Goal: Individualized Educational Video(s) Outcome: Progressing   Problem: Coping: Goal: Will verbalize positive feelings about self Outcome: Progressing   Ival Bible, BSN, RN

## 2019-09-15 NOTE — Progress Notes (Signed)
Patient admitted after midnight. Cared began before midnight. Hx stroke with residual right sided weakness, goiter, htn presented with worsening right sided weakness, facial droop and slurred speech. Work up included MRI revealing 11 mm acute ischemic nonhemorrhagic left basal ganglia infarct. Underlying age-related cerebral atrophy with severe chronic microvascular ischemic disease, with multiple remote lacunar infarcts as above.  Multiple scattered chronic micro hemorrhages clustered about the deep gray nuclei, brainstem, and cerebellum, consistent with chronic poorly controlled hypertension. Admitted for stroke work up   A/P Acute ischemic stroke. Patient with history of prior stroke with residual right-sided deficits presenting with complaints of worsening right-sided weakness and slurred speech. Out of TPA window. CT head negative for acute intracranial abnormality.  Brain MRI showing a 11 mm acute ischemic nonhemorrhagic left basal ganglia infarct.  Patient received aspirin 324 mg.y to normalize blood pressure. CTA head and neck negative for large vessel occlusion. Echo with ef 65% with normal LV function. Hemoglobin A1c 5.2, fasting lipid panel with LDL 114. Passed swallow eval -Aspirin 325 p.o. or 300 mg PR daily -Atorvastatin 80 mg now and daily -Frequent neurochecks -PT, OT, speech therapy. --Neurology following, appreciate recommendations  Hypokalemia. repleted and resolved.  Not on a diuretic. Magnesium 1.6. mag repleted as well  Hypertension. BP on soft side. Provided with fluid bolus. Home meds include amlodipine, doxazosin, losartin, metoprolol and clonidine. -Continue BB -hold other anti-hypertensive med -monitor  Depression, anxiety, mood disorder -Continue home Celexa -Continue home Klonopin as needed   Dyanne Carrel, NP

## 2019-09-15 NOTE — Progress Notes (Signed)
*  PRELIMINARY RESULTS* Echocardiogram 2D Echocardiogram has been performed.  Leavy Cella 09/15/2019, 10:12 AM

## 2019-09-15 NOTE — [place] FL2 (Signed)
[place] MEDICAID FL2 LEVEL OF CARE SCREENING TOOL     IDENTIFICATION  Patient Name: Bailey [name] Birthdate: 1955-10-25 Sex: female Admission Date (Current Location): 09/14/2019  [facility] and [place] Number:  Producer, television/film/video and Address:  [facility] [place], [address] [place], [place] [address]      Provider Number: [id number]  Attending Physician Name and Address:  Joseph Art, DO  Relative Name and Phone Number:       Current Level of Care: Hospital Recommended Level of Care: Skilled Nursing Facility Prior Approval Number:    Date Approved/Denied:   PASRR Number: [id number]  Discharge Plan: SNF    Current Diagnoses: Patient Active Problem List   Diagnosis Date Noted  . Acute CVA (cerebrovascular accident) (HCC) 09/15/2019  . Depression 09/15/2019  . 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 Mild cognitive impairment 03/11/2017  . Syncopal episodes 05/16/2015  . Acute renal injury (HCC) 05/16/2015  . Elevated troponin 05/16/2015  . Hypertension 05/16/2015  . Non compliance w medication regimen 05/16/2015  . Pulmonary nodule 05/16/2015  . Pressure ulcer 02/28/2015  . Dehydration 02/26/2015  . Intractable vomiting with nausea 02/26/2015  . Hypokalemia 02/25/2015  . Abdominal pain   . Primary osteoarthritis of right hip 02/15/2015  . Severe recurrent major depressive disorder with psychotic features (HCC) 09/01/2014  . GAD (generalized anxiety disorder) 09/01/2014  . PTSD (post-traumatic stress disorder) 09/01/2014  . Hip arthritis left 12/23/2011  . 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 10/21/2011    Orientation RESPIRATION BLADDER Height & Weight     Self, Place  Normal Incontinent, External catheter Weight:   Height:     BEHAVIORAL SYMPTOMS/MOOD NEUROLOGICAL BOWEL NUTRITION STATUS      Continent Diet(see discharge summary)  AMBULATORY STATUS COMMUNICATION OF NEEDS Skin   Extensive Assist Verbally Normal                     Personal Care Assistance Level of Assistance  Bathing, Feeding, Dressing Bathing Assistance: Maximum assistance Feeding assistance: Independent Dressing Assistance: Maximum assistance     Functional Limitations Info  Hearing, Speech, Sight Sight Info: Adequate Hearing Info: Adequate Speech Info: Impaired(slurred)    SPECIAL CARE FACTORS FREQUENCY  OT (By licensed OT), PT (By licensed PT)     PT Frequency: 5x week OT Frequency: 5x week            Contractures Contractures Info: Not present    Additional Factors Info  Code Status, Allergies, Psychotropic Code Status Info: Full Code Allergies Info: Codeine, Sulfa Antibiotics, Sulfa Drugs Cross Reactors Psychotropic Info: citalopram (CELEXA) tablet 40 mg daily PO; clonazePAM (KLONOPIN) tablet 0.5 mg 2x daily PO; QUEtiapine (SEROQUEL) tablet 50 mg daily at bedtime PO for insomnia         Current Medications (09/15/2019):  This is the current hospital active medication list Current Facility-Administered Medications  Medication Dose Route Frequency Provider Last Rate Last Admin  . acetaminophen (TYLENOL) tablet 650 mg  650 mg Oral Q4H PRN John Giovanni, MD       Or  . acetaminophen (TYLENOL) 160 MG/5ML solution 650 mg  650 mg Per Tube Q4H PRN John Giovanni, MD       Or  . acetaminophen (TYLENOL) suppository 650 mg  650 mg Rectal Q4H PRN John Giovanni, MD      . ascorbic acid (VITAMIN C) tablet 500 mg  500 mg Oral Daily John Giovanni, MD   500 mg at 09/15/19 1021  .  aspirin EC tablet 81 mg  81 mg Oral Daily Rosalin Hawking, MD   81 mg at 09/15/19 1021  . atorvastatin (LIPITOR) tablet 40 mg  40 mg Oral q1800 Rosalin Hawking, MD      . cholecalciferol (VITAMIN D3) tablet 2,000 Units  2,000 Units Oral Daily Shela Leff, MD   2,000 Units at 09/15/19 1021  . citalopram (CELEXA) tablet 40 mg  40 mg Oral Daily Shela Leff, MD      . clonazePAM Bobbye [place]) tablet 0.5 mg  0.5 mg Oral BID Shela Leff, MD   0.5 mg at 09/15/19 1120  . clopidogrel (PLAVIX) tablet 75 mg  75 mg Oral Daily Rosalin Hawking, MD   75 mg at 09/15/19 1021  . enoxaparin (LOVENOX) injection 40 mg  40 mg Subcutaneous Q24H Shela Leff, MD   40 mg at 09/15/19 0532  . magnesium sulfate IVPB 2 g 50 mL  2 g Intravenous Once Black, Karen M, NP      . metoprolol tartrate (LOPRESSOR) tablet 12.5 mg  12.5 mg Oral BID Vann, Jessica U, DO      . pantoprazole (PROTONIX) EC tablet 40 mg  40 mg Oral Daily Shela Leff, MD   40 mg at 09/15/19 1021  . [START ON 09/16/2019] pneumococcal 23 valent vaccine (PNEUMOVAX-23) injection 0.5 mL  0.5 mL Intramuscular Tomorrow-1000 Shela Leff, MD      . QUEtiapine (SEROQUEL) tablet 50 mg  50 mg Oral QHS Shela Leff, MD   50 mg at 09/15/19 0327  . sodium chloride flush (NS) 0.9 % injection 3 mL  3 mL Intravenous Once Shela Leff, MD         Discharge Medications: Please see discharge summary for a list of discharge medications.  Relevant Imaging Results:  Relevant Lab Results:   Additional Information SS# [place], [place]

## 2019-09-16 LAB — BASIC METABOLIC PANEL WITH GFR
Anion gap: 11 (ref 5–15)
BUN: 12 mg/dL (ref 8–23)
CO2: 28 mmol/L (ref 22–32)
Calcium: 8.8 mg/dL — ABNORMAL LOW (ref 8.9–10.3)
Chloride: 106 mmol/L (ref 98–111)
Creatinine, Ser: 0.86 mg/dL (ref 0.44–1.00)
GFR calc Af Amer: >60 mL/min
GFR calc non Af Amer: >60 mL/min
Glucose, Bld: 113 mg/dL — ABNORMAL HIGH (ref 70–99)
Potassium: 3.3 mmol/L — ABNORMAL LOW (ref 3.5–5.1)
Sodium: 145 mmol/L (ref 135–145)

## 2019-09-16 MED ORDER — CLONIDINE HCL 0.1 MG PO TABS
0.2000 mg | ORAL_TABLET | Freq: Two times a day (BID) | ORAL | Status: DC
  Administered 2019-09-16 – 2019-09-17 (×3): 0.2 mg via ORAL
  Filled 2019-09-16 (×3): qty 2

## 2019-09-16 MED ORDER — ONDANSETRON HCL 4 MG/2ML IJ SOLN
4.0000 mg | Freq: Four times a day (QID) | INTRAMUSCULAR | Status: DC | PRN
  Administered 2019-09-16 (×2): 4 mg via INTRAVENOUS
  Filled 2019-09-16: qty 2

## 2019-09-16 MED ORDER — ROSUVASTATIN CALCIUM 20 MG PO TABS
20.0000 mg | ORAL_TABLET | Freq: Every day | ORAL | Status: DC
  Administered 2019-09-16: 20 mg via ORAL
  Filled 2019-09-16: qty 1

## 2019-09-16 MED ORDER — AMLODIPINE BESYLATE 2.5 MG PO TABS
2.5000 mg | ORAL_TABLET | Freq: Every day | ORAL | Status: DC
  Administered 2019-09-16 – 2019-09-17 (×2): 2.5 mg via ORAL
  Filled 2019-09-16 (×2): qty 1

## 2019-09-16 MED ORDER — ONDANSETRON HCL 4 MG/2ML IJ SOLN
INTRAMUSCULAR | Status: AC
  Filled 2019-09-16: qty 2

## 2019-09-16 MED ORDER — LOSARTAN POTASSIUM 50 MG PO TABS
100.0000 mg | ORAL_TABLET | Freq: Every day | ORAL | Status: DC
  Administered 2019-09-16 – 2019-09-17 (×2): 100 mg via ORAL
  Filled 2019-09-16 (×2): qty 2

## 2019-09-16 MED ORDER — POTASSIUM CHLORIDE CRYS ER 20 MEQ PO TBCR
40.0000 meq | EXTENDED_RELEASE_TABLET | ORAL | Status: AC
  Administered 2019-09-16 (×2): 40 meq via ORAL
  Filled 2019-09-16: qty 2
  Filled 2019-09-16 (×2): qty 4

## 2019-09-16 NOTE — Progress Notes (Signed)
[facility] PROGRESS NOTE  ROMEKA SCIFRES ZOX:[id number] DOB: December 24, 1960 DOA: 09/14/2019 PCP: Georgann Housekeeper, MD  Assessment/Plan:  Acute ischemic stroke. Patient with history of prior stroke with residual right-sided deficits presented 12/16 with complaints of worsening right-sided weakness and slurred speech. Out of TPA window. CT head negative for acute intracranial abnormality. Brain MRI showing a 11 mm acute ischemic nonhemorrhagic left basal ganglia infarct. Patient received aspirin 324 mg.y to normalize blood pressure. CTA head and neck negative for large vessel occlusion. Echo with ef 65% with normal LV function. Hemoglobin A1c 5.2, fasting lipid panel with LDL 114. Passed swallow eval. PT/OT recommending snf. Awaiting snf placement -Aspirin 325p.o. or 300 mg PR daily -Atorvastatin 80 mg now and daily -Frequent neurochecks. --Neurologyfollowing, appreciate recommendations  Hypokalemia. 3.3 this am. Magnesium given yesterday for mag level 1.6. Not on a diuretic.  -replete -recheck  Hypertension. BP  Elevated. Home med resumed except for losartan. Improved control but high end of normal. -resume losartan. -Continue BB and other homemeds -monitor  Depression, anxiety, mood disorder -Continue home Celexa -Continue home Klonopin as needed    Code Status: full Family Communication: patient Disposition Plan: snf   Consultants:  [facility] neuro  Procedures:    Antibiotics:    HPI/Subjective: Awake alert complaining about the oatmeal. Denies pain/discomfort  Objective: Vitals:   09/16/19 0816 09/16/19 1215  BP: (!) 173/97 (!) 184/103  Pulse: 78 88  Resp: 20 20  Temp: 98.2 F (36.8 C) 98.6 F (37 C)  SpO2: 95% 95%    Intake/Output Summary (Last 24 hours) at 09/16/2019 1308 Last data filed at 09/16/2019 0848 Gross per 24 hour  Intake 894.97 ml  Output 500 ml  Net 394.97 ml   There were no vitals filed for this visit.  Exam:   General:   Awake alert no acute distress  Cardiovascular: rrr no mgr trace LE edeam  Respiratory: normal effort BS clear bilaterally no wheeze  Abdomen: obese soft +BS non-tender to palpation  Musculoskeletal: joints without swelling   Neuro: alert and oriented to self and place. Facial droop, slurred speech right sided weakness   Data Reviewed: Basic Metabolic Panel: Recent Labs  Lab 09/14/19 1126 09/15/19 0636 09/16/19 0249  NA 143 146* 145  K 3.0* 4.0 3.3*  CL 102 107 106  CO2 GLUCOSE 96 96 113*  BUN CREATININE 0.94 0.91 0.86  CALCIUM 8.6* 8.8* 8.8*  MG  --  1.6*  --    Liver Function Tests: Recent Labs  Lab 09/14/19 1126  AST 17  ALT 15  ALKPHOS 95  BILITOT 1.2  PROT 6.8  ALBUMIN 3.5   No results for input(s): LIPASE, AMYLASE in the last 168 hours. No results for input(s): AMMONIA in the last 168 hours. CBC: Recent Labs  Lab 09/14/19 1126  WBC 3.9*  NEUTROABS 2.2  HGB 12.6  HCT 35.5*  MCV 82.6  PLT 266   Cardiac Enzymes: No results for input(s): CKTOTAL, CKMB, CKMBINDEX, TROPONINI in the last 168 hours. BNP (last 3 results) Recent Labs    09/30/18 2124  BNP 40.9    ProBNP (last 3 results) No results for input(s): PROBNP in the last 8760 hours.  CBG: No results for input(s): GLUCAP in the last 168 hours.  Recent Results (from the past 240 hour(s))  SARS CORONAVIRUS 2 (TAT 6-24 HRS) Nasopharyngeal Nasopharyngeal Swab     Status: None   Collection Time: 09/15/19  1:17 AM  Specimen: Nasopharyngeal Swab  Result Value Ref Range Status   SARS Coronavirus 2 NEGATIVE NEGATIVE Final    Comment: (NOTE) SARS-CoV-2 target nucleic acids are NOT DETECTED. The SARS-CoV-2 RNA is generally detectable in upper and lower respiratory specimens during the acute phase of infection. Negative results do not preclude SARS-CoV-2 infection, do not rule out co-infections with other pathogens, and should not be used as the sole basis for treatment or  other patient management decisions. Negative results must be combined with clinical observations, patient history, and epidemiological information. The expected result is Negative. Fact Sheet for Patients: SugarRoll.be Fact Sheet for Healthcare Providers: https://www.woods-mathews.com/ This test is not yet approved or cleared by the [place] FDA and  has been authorized for detection and/or diagnosis of SARS-CoV-2 by FDA under an Emergency Use Authorization (EUA). This EUA will remain  in effect (meaning this test can be used) for the duration of the COVID-19 declaration under Section 56 4(b)(1) of the Act, 21 U.S.C. section 360bbb-3(b)(1), unless the authorization is terminated or revoked sooner. Performed at [facility], [place] [address] [place], [place] [address]      Studies: CT ANGIO HEAD W OR WO CONTRAST  Result Date: 09/15/2019 CLINICAL DATA:  Follow-up examination for acute stroke. EXAM: CT ANGIOGRAPHY HEAD AND NECK TECHNIQUE: Multidetector CT imaging of the head and neck was performed using the standard protocol during bolus administration of intravenous contrast. Multiplanar CT image reconstructions and MIPs were obtained to evaluate the vascular anatomy. Carotid stenosis measurements (when applicable) are obtained utilizing NASCET criteria, using the distal internal carotid diameter as the denominator. CONTRAST:  43mL OMNIPAQUE IOHEXOL 350 MG/ML SOLN COMPARISON:  Prior MRI from earlier the same day. FINDINGS: CTA NECK FINDINGS Aortic arch: Visualized aortic arch of normal caliber with normal branch pattern. No hemodynamically significant stenosis seen about the origin of the great vessels. Visualized subclavian arteries widely patent. Right carotid system: Right common and internal carotid arteries are diffusely tortuous but widely patent to the skull base without stenosis, dissection or occlusion. Right carotid artery system  medialized into the retropharyngeal space. Left carotid system: Left common and internal carotid arteries are diffusely tortuous but widely patent to the skull base without stenosis, dissection or occlusion. Left carotid artery system medialized into the retropharyngeal space. Vertebral arteries: Both vertebral arteries arise from the subclavian arteries. Vertebral arteries mildly tortuous but widely patent within the neck without stenosis, dissection or occlusion. Skeleton: No acute osseous finding. No discrete lytic or blastic osseous lesions. Other neck: No other acute soft tissue abnormality within the neck. Enlarged multinodular right lobe of thyroid is seen. Dominant 19 mm hypodense right thyroid not (series 6, image 116). Left thyroid lobe hypoplastic and/or absent. Upper chest: Visualized upper chest demonstrates no acute finding. Partially visualized lungs are largely clear. Review of the MIP images confirms the above findings CTA HEAD FINDINGS Anterior circulation: Both petrous segments widely patent. Mild scattered atheromatous plaque within the cavernous/supraclinoid ICAs without significant stenosis. A1 segments widely patent. Normal anterior communicating artery. Anterior cerebral arteries widely patent to their distal aspects without stenosis. M1 segments widely patent bilaterally. Normal MCA bifurcations. Distal MCA branches well perfused and symmetric. Posterior circulation: Vertebral arteries mildly irregular but patent to the vertebrobasilar junction without flow-limiting stenosis. Patent right PICA. Left PICA not seen. Basilar widely patent to its distal aspect. Basilar tip somewhat ectatic without discrete aneurysm. Superior cerebral arteries patent bilaterally. Right PCA supplied via the basilar as well as a small right posterior communicating  artery. Fetal type origin of the left PCA. PCAs mildly irregular but are well perfused to their distal aspects without significant stenosis. Venous  sinuses: Grossly patent allowing for timing the contrast bolus. Anatomic variants: Fetal type origin left PCA. No intracranial aneurysm or other vascular abnormality. Review of the MIP images confirms the above findings IMPRESSION: 1. Negative CTA for large vessel occlusion. No high-grade or correctable stenosis identified. 2. Diffuse tortuosity of the major arterial vasculature of the head and neck, suggesting chronic underlying hypertension. 3. Enlarged multinodular right lobe of thyroid with dominant 19 mm right thyroid nodule. Further evaluation with dedicated thyroid ultrasound recommended. This could be performed on a nonemergent basis.(ref: J Am Coll Radiol. 2015 Feb;12(2): 143-50). Electronically Signed   By: Rise Mu M.D.   On: 09/15/2019 04:06   CT ANGIO NECK W OR WO CONTRAST  Result Date: 09/15/2019 CLINICAL DATA:  Follow-up examination for acute stroke. EXAM: CT ANGIOGRAPHY HEAD AND NECK TECHNIQUE: Multidetector CT imaging of the head and neck was performed using the standard protocol during bolus administration of intravenous contrast. Multiplanar CT image reconstructions and MIPs were obtained to evaluate the vascular anatomy. Carotid stenosis measurements (when applicable) are obtained utilizing NASCET criteria, using the distal internal carotid diameter as the denominator. CONTRAST:  80mL OMNIPAQUE IOHEXOL 350 MG/ML SOLN COMPARISON:  Prior MRI from earlier the same day. FINDINGS: CTA NECK FINDINGS Aortic arch: Visualized aortic arch of normal caliber with normal branch pattern. No hemodynamically significant stenosis seen about the origin of the great vessels. Visualized subclavian arteries widely patent. Right carotid system: Right common and internal carotid arteries are diffusely tortuous but widely patent to the skull base without stenosis, dissection or occlusion. Right carotid artery system medialized into the retropharyngeal space. Left carotid system: Left common and internal  carotid arteries are diffusely tortuous but widely patent to the skull base without stenosis, dissection or occlusion. Left carotid artery system medialized into the retropharyngeal space. Vertebral arteries: Both vertebral arteries arise from the subclavian arteries. Vertebral arteries mildly tortuous but widely patent within the neck without stenosis, dissection or occlusion. Skeleton: No acute osseous finding. No discrete lytic or blastic osseous lesions. Other neck: No other acute soft tissue abnormality within the neck. Enlarged multinodular right lobe of thyroid is seen. Dominant 19 mm hypodense right thyroid not (series 6, image 116). Left thyroid lobe hypoplastic and/or absent. Upper chest: Visualized upper chest demonstrates no acute finding. Partially visualized lungs are largely clear. Review of the MIP images confirms the above findings CTA HEAD FINDINGS Anterior circulation: Both petrous segments widely patent. Mild scattered atheromatous plaque within the cavernous/supraclinoid ICAs without significant stenosis. A1 segments widely patent. Normal anterior communicating artery. Anterior cerebral arteries widely patent to their distal aspects without stenosis. M1 segments widely patent bilaterally. Normal MCA bifurcations. Distal MCA branches well perfused and symmetric. Posterior circulation: Vertebral arteries mildly irregular but patent to the vertebrobasilar junction without flow-limiting stenosis. Patent right PICA. Left PICA not seen. Basilar widely patent to its distal aspect. Basilar tip somewhat ectatic without discrete aneurysm. Superior cerebral arteries patent bilaterally. Right PCA supplied via the basilar as well as a small right posterior communicating artery. Fetal type origin of the left PCA. PCAs mildly irregular but are well perfused to their distal aspects without significant stenosis. Venous sinuses: Grossly patent allowing for timing the contrast bolus. Anatomic variants: Fetal type  origin left PCA. No intracranial aneurysm or other vascular abnormality. Review of the MIP images confirms the above findings IMPRESSION: 1. Negative  CTA for large vessel occlusion. No high-grade or correctable stenosis identified. 2. Diffuse tortuosity of the major arterial vasculature of the head and neck, suggesting chronic underlying hypertension. 3. Enlarged multinodular right lobe of thyroid with dominant 19 mm right thyroid nodule. Further evaluation with dedicated thyroid ultrasound recommended. This could be performed on a nonemergent basis.(ref: J Am Coll Radiol. 2015 Feb;12(2): 143-50). Electronically Signed   By: Rise MuBenjamin  McClintock M.D.   On: 09/15/2019 04:06   MR BRAIN WO CONTRAST  Result Date: 09/15/2019 CLINICAL DATA:  Initial evaluation for acute weakness. EXAM: MRI HEAD WITHOUT CONTRAST TECHNIQUE: Multiplanar, multiecho pulse sequences of the brain and surrounding structures were obtained without intravenous contrast. COMPARISON:  Prior CT from 09/14/2019. FINDINGS: Brain: Generalized age-related cerebral atrophy. Patchy and confluent T2/FLAIR hyperintensity within the periventricular deep white matter both cerebral hemispheres most consistent with chronic small vessel ischemic disease, severe in nature. Multiple scatter remote lacunar infarcts seen involving the bilateral basal ganglia, thalami, and periventricular white matter of the corona radiata. Associated chronic hemosiderin staining seen about several of these infarcts. Few scattered remote subcentimeter bilateral cerebellar infarcts noted as well. There is a 11 mm focus of restricted diffusion involving the posterior left basal ganglia, consistent with an acute ischemic perforator type infarct (series 5, image 73). No associated hemorrhage or mass effect. No other evidence for acute or subacute ischemia. No acute intracranial hemorrhage elsewhere within the brain. Multiple scattered chronic micro hemorrhages seen clustered about the,  consistent with chronic poorly controlled hypertension. No mass lesion, midline shift or mass effect. No hydrocephalus. No extra-axial fluid collection. Pituitary gland and suprasellar region normal. Midline structures intact. Brainstem, and cerebellum Vascular: Major intracranial vascular flow voids are maintained. Skull and upper cervical spine: Craniocervical junction within normal limits. Bone marrow signal intensity normal. No scalp soft tissue abnormality. Sinuses/Orbits: Globes and orbital soft tissues demonstrate no acute finding. Paranasal sinuses are largely clear. Trace right mastoid effusion, of doubtful significance. Inner ear structures normal. Other: None. IMPRESSION: 1. 11 mm acute ischemic nonhemorrhagic left basal ganglia infarct. 2. Underlying age-related cerebral atrophy with severe chronic microvascular ischemic disease, with multiple remote lacunar infarcts as above. 3. Multiple scattered chronic micro hemorrhages clustered about the deep gray nuclei, brainstem, and cerebellum, consistent with chronic poorly controlled hypertension. Electronically Signed   By: Rise MuBenjamin  McClintock M.D.   On: 09/15/2019 01:31   DG Chest Portable 1 View  Result Date: 09/15/2019 CLINICAL DATA:  CVA. Slurred speech. Right arm and leg weakness. EXAM: PORTABLE CHEST 1 VIEW COMPARISON:  Radiograph of 10/01/2018 FINDINGS: Borderline cardiomegaly stable from prior. Unchanged mediastinal contours with aortic tortuosity. Minor bibasilar atelectasis. No pulmonary edema, pleural fluid or pneumothorax. No acute osseous abnormalities are seen. IMPRESSION: 1. Borderline cardiomegaly, stable from prior. 2. Minor bibasilar atelectasis. Electronically Signed   By: Narda RutherfordMelanie  Sanford M.D.   On: 09/15/2019 00:53   ECHOCARDIOGRAM COMPLETE  Result Date: 09/15/2019   ECHOCARDIOGRAM REPORT   Patient Name:   Harvest DarkMARGUERITE B Wernick Date of Exam: 09/15/2019 Medical Rec #:  [id number]        Height:       61.0 in Accession #:     [id number]       Weight:       200.0 lb Date of Birth:  [birth year]        BSA:          1.89 m Patient Age:    63 years         BP:  116/79 mmHg Patient Gender: F                HR:           78 bpm. Exam Location:  Inpatient Procedure: 2D Echo Indications:    Stroke 434.91 / I163.9  History:        Patient has no prior history of Echocardiogram examinations.                 Stroke; Risk Factors:Hypertension and Non-Smoker. Elevated                 Troponin, PTSD, Acute renal injury.  Sonographer:    Jeryl Columbia Referring Phys: [phone] Kierstin January M Osha Errico IMPRESSIONS  1. Left ventricular ejection fraction, by visual estimation, is 65 to 70%. The left ventricle has normal function. There is mildly increased left ventricular hypertrophy.  2. Small left ventricular internal cavity size.  3. The left ventricle has no regional wall motion abnormalities.  4. Global right ventricle has normal systolic function.The right ventricular size is normal. No increase in right ventricular wall thickness.  5. Left atrial size was normal.  6. Right atrial size was normal.  7. Presence of pericardial fat pad.  8. Trivial pericardial effusion is present.  9. The mitral valve is grossly normal. No evidence of mitral valve regurgitation. 10. The tricuspid valve is grossly normal. Tricuspid valve regurgitation is trivial. 11. The aortic valve was not well visualized. Aortic valve regurgitation is not visualized. No evidence of aortic valve sclerosis or stenosis. 12. The pulmonic valve was not well visualized. Pulmonic valve regurgitation is not visualized. 13. TR signal is inadequate for assessing pulmonary artery systolic pressure. 14. The inferior vena cava is normal in size with greater than 50% respiratory variability, suggesting right atrial pressure of 3 mmHg. 15. No prior Echocardiogram. FINDINGS  Left Ventricle: Left ventricular ejection fraction, by visual estimation, is 65 to 70%. The left ventricle has normal function. The  left ventricle has no regional wall motion abnormalities. The left ventricular internal cavity size was the LV cavity size is small. There is mildly increased left ventricular hypertrophy. Concentric left ventricular hypertrophy. Left ventricular diastolic parameters are consistent with age-related delayed relaxation (normal). Normal left atrial pressure. Right Ventricle: The right ventricular size is normal. No increase in right ventricular wall thickness. Global RV systolic function is has normal systolic function. Left Atrium: Left atrial size was normal in size. Right Atrium: Right atrial size was normal in size Pericardium: Trivial pericardial effusion is present. Presence of pericardial fat pad. Mitral Valve: The mitral valve is grossly normal. No evidence of mitral valve regurgitation. Tricuspid Valve: The tricuspid valve is grossly normal. Tricuspid valve regurgitation is trivial. Aortic Valve: The aortic valve was not well visualized. Aortic valve regurgitation is not visualized. The aortic valve is structurally normal, with no evidence of sclerosis or stenosis. Pulmonic Valve: The pulmonic valve was not well visualized. Pulmonic valve regurgitation is not visualized. Pulmonic regurgitation is not visualized. Aorta: The aortic root is normal in size and structure. Venous: The inferior vena cava is normal in size with greater than 50% respiratory variability, suggesting right atrial pressure of 3 mmHg. IAS/Shunts: No atrial level shunt detected by color flow Doppler.  LEFT VENTRICLE PLAX 2D LVIDd:         3.20 cm  Diastology LVIDs:         2.10 cm  LV e' lateral:   8.27 cm/s LV PW:  1.40 cm  LV E/e' lateral: 6.5 LV IVS:        1.50 cm  LV e' medial:    6.96 cm/s LVOT diam:     1.90 cm  LV E/e' medial:  7.8 LV SV:         27 ml LV SV Index:   13.13 LVOT Area:     2.84 cm  RIGHT VENTRICLE TAPSE (M-mode): 2.4 cm LEFT ATRIUM           Index       RIGHT ATRIUM           Index LA diam:      4.10 cm 2.17  cm/m  RA Area:     14.90 cm LA Vol (A2C): 33.3 ml 17.63 ml/m RA Volume:   35.60 ml  18.85 ml/m LA Vol (A4C): 39.2 ml 20.75 ml/m   AORTA Ao Root diam: 2.90 cm MITRAL VALVE MV Area (PHT): 4.12 cm             SHUNTS MV PHT:        53.46 msec           Systemic Diam: 1.90 cm MV Decel Time: 184 msec MV E velocity: 54.15 cm/s 103 cm/s MV A velocity: 61.60 cm/s 70.3 cm/s MV E/A ratio:  0.88       1.5  Lennie Odor MD Electronically signed by Lennie Odor MD Signature Date/Time: 09/15/2019/10:39:30 AM    Final     Scheduled Meds: . amLODipine  2.5 mg Oral Daily  . vitamin C  500 mg Oral Daily  . aspirin EC  81 mg Oral Daily  . cholecalciferol  2,000 Units Oral Daily  . citalopram  40 mg Oral Daily  . clonazePAM  0.5 mg Oral BID  . cloNIDine  0.2 mg Oral BID  . clopidogrel  75 mg Oral Daily  . enoxaparin (LOVENOX) injection  40 mg Subcutaneous Q24H  . metoprolol tartrate  12.5 mg Oral BID  . ondansetron      . pantoprazole  40 mg Oral Daily  . pneumococcal 23 valent vaccine  0.5 mL Intramuscular Tomorrow-1000  . QUEtiapine  50 mg Oral QHS  . rosuvastatin  20 mg Oral q1800  . sodium chloride flush  3 mL Intravenous Once   Continuous Infusions:  Principal Problem:   Acute CVA (cerebrovascular accident) (HCC) Active Problems:   Hypertension   GAD (generalized anxiety disorder)   Hypokalemia   Depression    Time spent: 40 minutes    [facility] M NP  [facility]  If 7PM-7AM, please contact night-coverage at [email or web address], password [facility] 09/16/2019, 1:08 PM  LOS: 1 day

## 2019-09-16 NOTE — Progress Notes (Signed)
Physical Therapy Treatment Patient Details Name: Bailey [name] MRN: [id number] DOB: 1956-07-03 Today's Date: 09/16/2019    History of Present Illness Bailey [name] is a 63 y.o. female with medical history significant of hypertension, prior stroke with residual right-sided weakness presenting with complaints of slurred speech and worsening right-sided weakness. MRI showing an 11 mm acute ischemic nonhemorrhagic left basal ganglia infarct.    PT Comments    Pt declining out of bed mobility due to nausea. Agreeable to bed level exercises (as listed below) to address strengthening and range of motion. D/c plan remains appropriate.    Follow Up Recommendations  SNF;Supervision/Assistance - 24 hour     Equipment Recommendations  Wheelchair (measurements PT);Hospital bed    Recommendations for Other Services       Precautions / Restrictions Precautions Precautions: Fall Restrictions Weight Bearing Restrictions: No    Mobility  Bed Mobility                  Transfers                    Ambulation/Gait                 Stairs             Wheelchair Mobility    Modified Rankin (Stroke Patients Only) Modified Rankin (Stroke Patients Only) Pre-Morbid Rankin Score: Moderate disability Modified Rankin: Severe disability     Balance                                            Cognition Arousal/Alertness: Awake/alert Behavior During Therapy: WFL for tasks assessed/performed Overall Cognitive Status: History of cognitive impairments - at baseline                                        Exercises General Exercises - Upper Extremity Shoulder Flexion: PROM;AROM;Both;10 reps;Supine Elbow Flexion: Both;10 reps;Supine;PROM;AROM Digit Composite Flexion: AAROM;10 reps;Right;Supine Composite Extension: AAROM;Right;10 reps;Supine General Exercises - Lower Extremity Ankle Circles/Pumps: Both;15 reps;Supine Heel  Slides: 15 reps;Supine;Left Hip ABduction/ADduction: Both;15 reps;Supine Straight Leg Raises: Both;15 reps;Supine    General Comments        Pertinent Vitals/Pain Pain Assessment: No/denies pain    Home Living                      Prior Function            PT Goals (current goals can now be found in the care plan section) Acute Rehab PT Goals Patient Stated Goal: "live by myself again." Potential to Achieve Goals: Fair Progress towards PT goals: Progressing toward goals    Frequency    Min 3X/week      PT Plan Current plan remains appropriate    Co-evaluation              AM-PAC PT "6 Clicks" Mobility   Outcome Measure  Help needed turning from your back to your side while in a flat bed without using bedrails?: A Little Help needed moving from lying on your back to sitting on the side of a flat bed without using bedrails?: A Little Help needed moving to and from a bed to a chair (including a wheelchair)?: Total Help needed standing up from a  chair using your arms (e.g., wheelchair or bedside chair)?: Total Help needed to walk in hospital room?: Total Help needed climbing 3-5 steps with a railing? : Total 6 Click Score: 10    End of Session   Activity Tolerance: Patient tolerated treatment well Patient left: in bed;with call bell/phone within reach;with bed alarm set   PT Visit Diagnosis: Other abnormalities of gait and mobility (R26.89);Muscle weakness (generalized) (M62.81);Difficulty in walking, not elsewhere classified (R26.2);Other symptoms and signs involving the nervous system (R29.898)     Time: 1535-1550 PT Time Calculation (min) (ACUTE ONLY): 15 min  Charges:  $Therapeutic Exercise: 8-22 mins                     Laurina Bustle, PT, DPT Acute Rehabilitation Services Pager [phone] Office [phone]    Vanetta Mulders 09/16/2019, 5:33 PM

## 2019-09-16 NOTE — TOC Initial Note (Signed)
Transition of Care [facility]) - Initial/Assessment Note    Patient Details  Name: Bailey [name] MRN: [id number] Date of Birth: 07-27-1956  [facility]) CM/SW Contact:    Geralynn Ochs, LCSW Phone Number: 09/16/2019, 1:51 PM  Clinical Narrative:    CSW met with patient to discuss recommendation for SNF. Patient agreeable to SNF placement, says she's been before but couldn't remember the name. CSW to provide patient with CMS list to review and received permission to fax out referral. CSW to follow.               Expected Discharge Plan: Skilled Nursing Facility Barriers to Discharge: Continued Medical Work up, Ship broker   Patient Goals and CMS Choice Patient states their goals for this hospitalization and ongoing recovery are:: be able to live on my own again CMS Medicare.gov Compare Post Acute Care list provided to:: Patient Choice offered to / list presented to : Patient  Expected Discharge Plan and Services Expected Discharge Plan: [place] Choice: [place] Living arrangements for the past 2 months: Single Family Home                                      Prior Living Arrangements/Services Living arrangements for the past 2 months: Single Family Home Lives with:: Self Patient language and need for interpreter reviewed:: No Do you feel safe going back to the place where you live?: Yes      Need for Family Participation in Patient Care: No (Comment) Care giver support system in place?: No (comment)   Criminal Activity/Legal Involvement Pertinent to Current Situation/Hospitalization: No - Comment as needed  Activities of Daily Living Home Assistive Devices/Equipment: None ADL Screening (condition at time of admission) Patient's cognitive ability adequate to safely complete daily activities?: Yes Is the patient deaf or have difficulty hearing?: No Does the patient have difficulty  seeing, even when wearing glasses/contacts?: No Does the patient have difficulty concentrating, remembering, or making decisions?: No Patient able to express need for assistance with ADLs?: Yes Does the patient have difficulty dressing or bathing?: Yes Independently performs ADLs?: No Communication: Independent Dressing (OT): Needs assistance Is this a change from baseline?: Pre-admission baseline Grooming: Needs assistance Is this a change from baseline?: Pre-admission baseline Feeding: Needs assistance Is this a change from baseline?: Change from baseline, expected to last <3 days Bathing: Needs assistance Is this a change from baseline?: Change from baseline, expected to last <3 days Toileting: Needs assistance Is this a change from baseline?: Pre-admission baseline In/Out Bed: Needs assistance Is this a change from baseline?: Pre-admission baseline Walks in Home: Needs assistance Is this a change from baseline?: Change from baseline, expected to last <3 days Does the patient have difficulty walking or climbing stairs?: Yes Weakness of Legs: Right Weakness of Arms/Hands: Right  Permission Sought/Granted Permission sought to share information with : Chartered certified accountant granted to share information with : Yes, Verbal Permission Granted     Permission granted to share info w AGENCY: SNF        Emotional Assessment Appearance:: Appears stated age Attitude/Demeanor/Rapport: Engaged Affect (typically observed): Pleasant Orientation: : Oriented to Self, Oriented to Place Alcohol / Substance Use: Alcohol Use Psych Involvement: Outpatient Provider  Admission diagnosis:  Neurological deficit present [R29.818] Right sided weakness [R53.1] Acute CVA (cerebrovascular accident) [facility]) [I63.9] Patient Active  Problem List   Diagnosis Date Noted  . Acute CVA (cerebrovascular accident) ([place]) 09/15/2019  . Depression 09/15/2019  . 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 Mild  cognitive impairment 03/11/2017  . Syncopal episodes 05/16/2015  . Acute renal injury ([place]) 05/16/2015  . Elevated troponin 05/16/2015  . Hypertension 05/16/2015  . Non compliance w medication regimen 05/16/2015  . Pulmonary nodule 05/16/2015  . Pressure ulcer 02/28/2015  . Dehydration 02/26/2015  . Intractable vomiting with nausea 02/26/2015  . Hypokalemia 02/25/2015  . Abdominal pain   . Primary osteoarthritis of right hip 02/15/2015  . Severe recurrent major depressive disorder with psychotic features ([place]) 09/01/2014  . GAD (generalized anxiety disorder) 09/01/2014  . PTSD (post-traumatic stress disorder) 09/01/2014  . Hip arthritis left 12/23/2011  . 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 [place] ([place]) [place] [address] Phone: [phone] Fax: [phone]     Social Determinants of Health (SDOH) Interventions    Readmission Risk Interventions No flowsheet data found.

## 2019-09-16 NOTE — Plan of Care (Signed)
Patient progressing towards plan of care goals. 

## 2019-09-16 NOTE — Evaluation (Signed)
Speech Language Pathology Evaluation Patient Details Name: Bailey [name] MRN: [id number] DOB: March 03, 1956 Today's Date: 09/16/2019 Time: 3299-2426 SLP Time Calculation (min) (ACUTE ONLY): 31 min  Problem List:  Patient Active Problem List   Diagnosis Date Noted  . Acute CVA (cerebrovascular accident) ([facility]) 09/15/2019  . Depression 09/15/2019  . 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 Mild cognitive impairment 03/11/2017  . Syncopal episodes 05/16/2015  . Acute renal injury ([facility]) 05/16/2015  . Elevated troponin 05/16/2015  . Hypertension 05/16/2015  . Non compliance w medication regimen 05/16/2015  . Pulmonary nodule 05/16/2015  . Pressure ulcer 02/28/2015  . Dehydration 02/26/2015  . Intractable vomiting with nausea 02/26/2015  . Hypokalemia 02/25/2015  . Abdominal pain   . Primary osteoarthritis of right hip 02/15/2015  . Severe recurrent major depressive disorder with psychotic features ([facility]) 09/01/2014  . GAD (generalized anxiety disorder) 09/01/2014  . PTSD (post-traumatic stress disorder) 09/01/2014  . Hip arthritis left 12/23/2011  . 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 Anxiety    "Breakdown" in the 1990's, hosp. for >1 wk. at [facility]    . Anxiety   . Arthritis    OA- R knee, L hip   . Asthma    years ago. "no problems in a long while."  . Constipation   . Depression   . GERD (gastroesophageal reflux disease)   . Goiter    removed  at age 51  . Hypertension    all BP meds. managed by Dr. Eula Listen    . Hypertension   . Incontinence of urine    weak bladder  . Pneumonia    while at [facility].- 1990's told that she had pneumonia & was isolated   . Stroke ([facility])    2005, R sided weakness   . Stroke [facility]) 2005   ? weakness in hand and arms per mother. patient unsure   Past Surgical History:  Past Surgical History:  Procedure Laterality Date  . ABDOMINAL HYSTERECTOMY    .  COLONOSCOPY    . DILATION AND CURETTAGE OF UTERUS     1980's  . goiter     removed age 40  . JOINT REPLACEMENT  10/2011   Rt knee  . JOINT REPLACEMENT Right    knee  . THYROIDECTOMY, PARTIAL    . TONSILLECTOMY    . TOTAL HIP ARTHROPLASTY  12/23/2011   Procedure: TOTAL HIP ARTHROPLASTY;  Surgeon: Nestor Lewandowsky, MD;  Location: [facility] OR;  Service: Orthopedics;  Laterality: Left;  DEPUY/PINNACLE CUPS/SROM STEM  . TOTAL HIP ARTHROPLASTY Left   . TOTAL HIP ARTHROPLASTY Right 02/15/2015   Procedure: TOTAL HIP ARTHROPLASTY;  Surgeon: Gean Birchwood, MD;  Location: [facility] OR;  Service: Orthopedics;  Laterality: Right;  . TOTAL KNEE ARTHROPLASTY  10/21/2011   Procedure: TOTAL KNEE ARTHROPLASTY;  Surgeon: Nestor Lewandowsky, MD;  Location: [facility] OR;  Service: Orthopedics;  Laterality: Right;  DEPUY SIGMA RP  . TUBAL LIGATION     HPI:  63 yo female adm to [facility] with right sided weakness, Pt found to have left basal ganglia CVA.  She has h/o stroke, and tested positive for benzos, opiaties and THC.  Pt reports memory problems prior to admission - from prior CVA. States that her mother takes care of her bills, the pharmacist her medications and her helper (x3 hours, 3 x's week) cooks and cleans. SLP evaluation ordered.   Assessment / Plan / Recommendation  Clinical Impression  MOCA for blind admnistered to the pt due to her inability to write - she scored 9/22 points indicative of severe cognitive deficits.  Unfortuantley no family is present to establish baseline function but pt does admit to premorbid memory deficits.  Mild mixed dysarthria apparent characterized by decreased articulaiton precision resulting in decreased intelligiblity at complex consonants and lengthier sentences. Pt is quiet and mostly only answers SLP questions with single short phrase.  She does demonstrate facial/trigeminal/lingual deficits.  Pt will benefit from skilled SLP to maximize her speech/cognitive rehabilitation to decrease caregiver burden.  Pt  agreeable to plan, she did become tearful during testing as she worries re: not being able to be independent at home ever.  Encouraged her to participate in rehab as much as able to increase liklihood for moderate independence in the future.    SLP Assessment  SLP Recommendation/Assessment: Patient needs continued Speech Lanaguage Pathology Services SLP Visit Diagnosis: Dysarthria and anarthria (R47.1);Cognitive communication deficit (R41.841)    Follow Up Recommendations  Skilled Nursing facility    Frequency and Duration min 1 x/week  1 week      SLP Evaluation Cognition  Overall Cognitive Status: No family/caregiver present to determine baseline cognitive functioning(baseline memory deficits per pt) Orientation Level: Oriented to person;Oriented to place;Disoriented to time;Disoriented to situation(disoriented to month) Attention: Selective Memory: Impaired Memory Impairment: Retrieval deficit(pt with decreased recall, able to identify all 5 words from multiple choice) Awareness: Appears intact Problem Solving: Impaired Problem Solving Impairment: Functional complex(serial subtraction, pt stated "I have a headache" and declined to try mental math for attention testing) Behaviors: (tearful appropriately toward end of session) Safety/Judgment: Appears intact(aware of need for assist)       Comprehension  Auditory Comprehension Overall Auditory Comprehension: Appears within functional limits for tasks assessed Yes/No Questions: Not tested Commands: Within Functional Limits Conversation: Complex Visual Recognition/Discrimination Discrimination: Within Function Limits Reading Comprehension Reading Status: Not tested    Expression Expression Primary Mode of Expression: Verbal Verbal Expression Overall Verbal Expression: Appears within functional limits for tasks assessed Initiation: (mild dlow initiation) Repetition: Impaired Level of Impairment: Sentence level Naming: No  impairment Pragmatics: No impairment Written Expression Dominant Hand: Right Written Expression: Not tested(pt with right hemiparesis)   Oral / Motor  Motor Speech Overall Motor Speech: Impaired Respiration: Impaired Phonation: Normal Resonance: Within functional limits Articulation: Impaired Intelligibility: Intelligibility reduced Word: 75-100% accurate Phrase: 75-100% accurate Sentence: 50-74% accurate Motor Planning: Witnin functional limits Motor Speech Errors: Not applicable Interfering Components: Premorbid status   GO                    Macario Golds 09/16/2019, 12:14 PM   Kathleen Lime, MS [place] Office [phone]

## 2019-09-17 LAB — BASIC METABOLIC PANEL
Anion gap: 8 (ref 5–15)
BUN: 10 mg/dL (ref 8–23)
CO2: 29 mmol/L (ref 22–32)
Calcium: 8.7 mg/dL — ABNORMAL LOW (ref 8.9–10.3)
Chloride: 106 mmol/L (ref 98–111)
Creatinine, Ser: 0.79 mg/dL (ref 0.44–1.00)
GFR calc Af Amer: >60 mL/min (ref >60)
GFR calc non Af Amer: >60 mL/min (ref >60)
Glucose, Bld: 100 mg/dL — ABNORMAL HIGH (ref 70–99)
Potassium: 3.5 mmol/L (ref 3.5–5.1)
Sodium: 143 mmol/L (ref 135–145)

## 2019-09-17 MED ORDER — CLOPIDOGREL BISULFATE 75 MG PO TABS: 75.0000 mg | ORAL_TABLET | Freq: Every day | ORAL | 0 refills | Status: AC

## 2019-09-17 MED ORDER — METOPROLOL TARTRATE 25 MG PO TABS: 12.5000 mg | ORAL_TABLET | Freq: Two times a day (BID) | ORAL | 0 refills | Status: AC

## 2019-09-17 MED ORDER — CLONIDINE HCL 0.2 MG PO TABS: 0.2000 mg | ORAL_TABLET | Freq: Two times a day (BID) | ORAL | 11 refills | Status: DC

## 2019-09-17 MED ORDER — CLONIDINE HCL 0.3 MG PO TABS: 0.3000 mg | ORAL_TABLET | Freq: Two times a day (BID) | ORAL | 0 refills | Status: DC

## 2019-09-17 MED ORDER — ROSUVASTATIN CALCIUM 20 MG PO TABS: 20.0000 mg | ORAL_TABLET | Freq: Every day | ORAL | 0 refills | Status: AC

## 2019-09-17 NOTE — Discharge Summary (Addendum)
Physician Discharge Summary  Bailey [name]:[id number] DOB: Sep 04, 1956 DOA: 09/14/2019  PCP: Georgann Housekeeper, MD  Admit date: 09/14/2019 Discharge date: 09/17/2019  Admitted From: home Disposition:  [name]  Recommendations for Outpatient Follow-up:  1. Follow up with PCP in 1-2 weeks 2. Please obtain BMP/CBC in one week 3. Please follow up on the following pending results:  Home Health: n/a  Equipment/Devices:n/a   Discharge Condition: stable  CODE STATUS: Full code  Diet recommendation:  Heart healthy  Brief/Interim Summary: This is a 63 year old female with a history of hypertension, general and anxiety disorder, depression.  He was admitted on 12/16 with a nonhemorrhagic left basal ganglia infarct with right-sided deficits.  She was assessed by neurology, PT, OT.  Her hypokalemia and her hypomagnesemia are both corrected.  She was started on aspirin and Plavix and is now stable for discharge to skilled nursing facility.  Additionally, her blood pressure and cholesterol medications were maximized.  Discharge Diagnoses:  Principal Problem:   Acute CVA (cerebrovascular accident) (HCC) Active Problems:   GAD (generalized anxiety disorder)   Hypokalemia   Hypertension   Depression   Hypomagnesemia    Discharge Instructions  Discharge Instructions    Ambulatory referral to Neurology   Complete by: As directed    An appointment is requested in approximately: 4 weeks   Call MD for:  difficulty breathing, headache or visual disturbances   Complete by: As directed    Call MD for:  extreme fatigue   Complete by: As directed    Call MD for:  hives   Complete by: As directed    Call MD for:  persistant dizziness or light-headedness   Complete by: As directed    Call MD for:  persistant nausea and vomiting   Complete by: As directed    Call MD for:  redness, tenderness, or signs of infection (pain, swelling, redness, odor or green/yellow discharge around incision  site)   Complete by: As directed    Call MD for:  severe uncontrolled pain   Complete by: As directed    Call MD for:  temperature >100.4   Complete by: As directed    Diet - low sodium heart healthy   Complete by: As directed    Increase activity slowly   Complete by: As directed      Allergies as of 09/17/2019      Reactions   Codeine Nausea Only, Other (See Comments)   headaches   Sulfa Antibiotics Nausea Only   Upset stomach, headaches   Sulfa Drugs Cross Reactors Other (See Comments)   Its been a long time ago      Medication List    STOP taking these medications   metoprolol succinate 100 MG 24 hr tablet Commonly known as: TOPROL-XL     TAKE these medications   acetaminophen 325 MG tablet Commonly known as: TYLENOL Take 2 tablets (650 mg total) by mouth every 6 (six) hours as needed for mild pain (or Fever >/= 101).   amLODipine 2.5 MG tablet Commonly known as: NORVASC Take 2.5 mg by mouth daily.   aspirin EC 325 MG tablet Take 1 tablet (325 mg total) by mouth 2 (two) times daily.   citalopram 40 MG tablet Commonly known as: CELEXA Take 40 mg by mouth daily.   clonazePAM 0.5 MG tablet Commonly known as: KLONOPIN Take 0.5 mg by mouth 2 (two) times daily.   cloNIDine 0.2 MG tablet Commonly known as: CATAPRES Take 1 tablet (  0.2 mg total) by mouth 2 (two) times daily. What changed:   medication strength  how much to take   clopidogrel 75 MG tablet Commonly known as: PLAVIX Take 1 tablet (75 mg total) by mouth daily. Start taking on: September 18, 2019   doxazosin 8 MG tablet Commonly known as: CARDURA Take 8 mg by mouth at bedtime.   FLAX SEED OIL PO Take 1,200 mg by mouth daily.   losartan 100 MG tablet Commonly known as: COZAAR Take 100 mg by mouth daily.   metoprolol tartrate 25 MG tablet Commonly known as: LOPRESSOR Take 0.5 tablets (12.5 mg total) by mouth 2 (two) times daily.   omeprazole 20 MG capsule Commonly known as:  PRILOSEC Take 40 mg by mouth daily.   potassium chloride 20 MEQ packet Commonly known as: KLOR-CON Take 20 mEq by mouth 2 (two) times daily.   QUEtiapine 50 MG tablet Commonly known as: SEROQUEL Take 1 tablet (50 mg total) by mouth at bedtime.   rosuvastatin 20 MG tablet Commonly known as: CRESTOR Take 1 tablet (20 mg total) by mouth daily at 6 PM.   VITAMIN B 12 PO Take 1 tablet by mouth daily.   vitamin C 500 MG tablet Commonly known as: ASCORBIC ACID Take 500 mg by mouth daily.   Vitamin D 50 MCG (2000 UT) tablet Take 2,000 Units by mouth daily.      Contact information for after-discharge care    Destination    [facility] Preferred SNF .   Service: Skilled Nursing Contact information: [address] [place] [id number] [phone]             Allergies  Allergen Reactions  . Codeine Nausea Only and Other (See Comments)    headaches  . Sulfa Antibiotics Nausea Only    Upset stomach, headaches  . Sulfa Drugs Cross Reactors Other (See Comments)    Its been a long time ago    Consultations:  Neurology   Procedures/Studies: CT ANGIO HEAD W OR WO CONTRAST  Result Date: 09/15/2019 CLINICAL DATA:  Follow-up examination for acute stroke. EXAM: CT ANGIOGRAPHY HEAD AND NECK TECHNIQUE: Multidetector CT imaging of the head and neck was performed using the standard protocol during bolus administration of intravenous contrast. Multiplanar CT image reconstructions and MIPs were obtained to evaluate the vascular anatomy. Carotid stenosis measurements (when applicable) are obtained utilizing NASCET criteria, using the distal internal carotid diameter as the denominator. CONTRAST:  80mL OMNIPAQUE IOHEXOL 350 MG/ML SOLN COMPARISON:  Prior MRI from earlier the same day. FINDINGS: CTA NECK FINDINGS Aortic arch: Visualized aortic arch of normal caliber with normal branch pattern. No hemodynamically significant stenosis seen about the origin  of the great vessels. Visualized subclavian arteries widely patent. Right carotid system: Right common and internal carotid arteries are diffusely tortuous but widely patent to the skull base without stenosis, dissection or occlusion. Right carotid artery system medialized into the retropharyngeal space. Left carotid system: Left common and internal carotid arteries are diffusely tortuous but widely patent to the skull base without stenosis, dissection or occlusion. Left carotid artery system medialized into the retropharyngeal space. Vertebral arteries: Both vertebral arteries arise from the subclavian arteries. Vertebral arteries mildly tortuous but widely patent within the neck without stenosis, dissection or occlusion. Skeleton: No acute osseous finding. No discrete lytic or blastic osseous lesions. Other neck: No other acute soft tissue abnormality within the neck. Enlarged multinodular right lobe of thyroid is seen. Dominant 19 mm hypodense  right thyroid not (series 6, image 116). Left thyroid lobe hypoplastic and/or absent. Upper chest: Visualized upper chest demonstrates no acute finding. Partially visualized lungs are largely clear. Review of the MIP images confirms the above findings CTA HEAD FINDINGS Anterior circulation: Both petrous segments widely patent. Mild scattered atheromatous plaque within the cavernous/supraclinoid ICAs without significant stenosis. A1 segments widely patent. Normal anterior communicating artery. Anterior cerebral arteries widely patent to their distal aspects without stenosis. M1 segments widely patent bilaterally. Normal MCA bifurcations. Distal MCA branches well perfused and symmetric. Posterior circulation: Vertebral arteries mildly irregular but patent to the vertebrobasilar junction without flow-limiting stenosis. Patent right PICA. Left PICA not seen. Basilar widely patent to its distal aspect. Basilar tip somewhat ectatic without discrete aneurysm. Superior cerebral  arteries patent bilaterally. Right PCA supplied via the basilar as well as a small right posterior communicating artery. Fetal type origin of the left PCA. PCAs mildly irregular but are well perfused to their distal aspects without significant stenosis. Venous sinuses: Grossly patent allowing for timing the contrast bolus. Anatomic variants: Fetal type origin left PCA. No intracranial aneurysm or other vascular abnormality. Review of the MIP images confirms the above findings IMPRESSION: 1. Negative CTA for large vessel occlusion. No high-grade or correctable stenosis identified. 2. Diffuse tortuosity of the major arterial vasculature of the head and neck, suggesting chronic underlying hypertension. 3. Enlarged multinodular right lobe of thyroid with dominant 19 mm right thyroid nodule. Further evaluation with dedicated thyroid ultrasound recommended. This could be performed on a nonemergent basis.(ref: J Am Coll Radiol. 2015 Feb;12(2): 143-50). Electronically Signed   By: Rise Mu M.D.   On: 09/15/2019 04:06   CT HEAD WO CONTRAST  Result Date: 09/14/2019 CLINICAL DATA:  Slurred speech, right-sided weakness. EXAM: CT HEAD WITHOUT CONTRAST TECHNIQUE: Contiguous axial images were obtained from the base of the skull through the vertex without intravenous contrast. COMPARISON:  Feb 26, 2019. FINDINGS: Brain: Mild chronic ischemic white matter disease is noted. No mass effect or midline shift is noted. Ventricular size is within normal limits. There is no evidence of mass lesion, hemorrhage or acute infarction. Vascular: No hyperdense vessel or unexpected calcification. Skull: Normal. Negative for fracture or focal lesion. Sinuses/Orbits: No acute finding. Other: None. IMPRESSION: Mild chronic ischemic white matter disease. No acute intracranial abnormality seen. Electronically Signed   By: Lupita Raider M.D.   On: 09/14/2019 11:49   CT ANGIO NECK W OR WO CONTRAST  Result Date: 09/15/2019 CLINICAL  DATA:  Follow-up examination for acute stroke. EXAM: CT ANGIOGRAPHY HEAD AND NECK TECHNIQUE: Multidetector CT imaging of the head and neck was performed using the standard protocol during bolus administration of intravenous contrast. Multiplanar CT image reconstructions and MIPs were obtained to evaluate the vascular anatomy. Carotid stenosis measurements (when applicable) are obtained utilizing NASCET criteria, using the distal internal carotid diameter as the denominator. CONTRAST:  80mL OMNIPAQUE IOHEXOL 350 MG/ML SOLN COMPARISON:  Prior MRI from earlier the same day. FINDINGS: CTA NECK FINDINGS Aortic arch: Visualized aortic arch of normal caliber with normal branch pattern. No hemodynamically significant stenosis seen about the origin of the great vessels. Visualized subclavian arteries widely patent. Right carotid system: Right common and internal carotid arteries are diffusely tortuous but widely patent to the skull base without stenosis, dissection or occlusion. Right carotid artery system medialized into the retropharyngeal space. Left carotid system: Left common and internal carotid arteries are diffusely tortuous but widely patent to the skull base without stenosis, dissection or occlusion.  Left carotid artery system medialized into the retropharyngeal space. Vertebral arteries: Both vertebral arteries arise from the subclavian arteries. Vertebral arteries mildly tortuous but widely patent within the neck without stenosis, dissection or occlusion. Skeleton: No acute osseous finding. No discrete lytic or blastic osseous lesions. Other neck: No other acute soft tissue abnormality within the neck. Enlarged multinodular right lobe of thyroid is seen. Dominant 19 mm hypodense right thyroid not (series 6, image 116). Left thyroid lobe hypoplastic and/or absent. Upper chest: Visualized upper chest demonstrates no acute finding. Partially visualized lungs are largely clear. Review of the MIP images confirms the  above findings CTA HEAD FINDINGS Anterior circulation: Both petrous segments widely patent. Mild scattered atheromatous plaque within the cavernous/supraclinoid ICAs without significant stenosis. A1 segments widely patent. Normal anterior communicating artery. Anterior cerebral arteries widely patent to their distal aspects without stenosis. M1 segments widely patent bilaterally. Normal MCA bifurcations. Distal MCA branches well perfused and symmetric. Posterior circulation: Vertebral arteries mildly irregular but patent to the vertebrobasilar junction without flow-limiting stenosis. Patent right PICA. Left PICA not seen. Basilar widely patent to its distal aspect. Basilar tip somewhat ectatic without discrete aneurysm. Superior cerebral arteries patent bilaterally. Right PCA supplied via the basilar as well as a small right posterior communicating artery. Fetal type origin of the left PCA. PCAs mildly irregular but are well perfused to their distal aspects without significant stenosis. Venous sinuses: Grossly patent allowing for timing the contrast bolus. Anatomic variants: Fetal type origin left PCA. No intracranial aneurysm or other vascular abnormality. Review of the MIP images confirms the above findings IMPRESSION: 1. Negative CTA for large vessel occlusion. No high-grade or correctable stenosis identified. 2. Diffuse tortuosity of the major arterial vasculature of the head and neck, suggesting chronic underlying hypertension. 3. Enlarged multinodular right lobe of thyroid with dominant 19 mm right thyroid nodule. Further evaluation with dedicated thyroid ultrasound recommended. This could be performed on a nonemergent basis.(ref: J Am Coll Radiol. 2015 Feb;12(2): 143-50). Electronically Signed   By: Rise Mu M.D.   On: 09/15/2019 04:06   MR BRAIN WO CONTRAST  Result Date: 09/15/2019 CLINICAL DATA:  Initial evaluation for acute weakness. EXAM: MRI HEAD WITHOUT CONTRAST TECHNIQUE: Multiplanar,  multiecho pulse sequences of the brain and surrounding structures were obtained without intravenous contrast. COMPARISON:  Prior CT from 09/14/2019. FINDINGS: Brain: Generalized age-related cerebral atrophy. Patchy and confluent T2/FLAIR hyperintensity within the periventricular deep white matter both cerebral hemispheres most consistent with chronic small vessel ischemic disease, severe in nature. Multiple scatter remote lacunar infarcts seen involving the bilateral basal ganglia, thalami, and periventricular white matter of the corona radiata. Associated chronic hemosiderin staining seen about several of these infarcts. Few scattered remote subcentimeter bilateral cerebellar infarcts noted as well. There is a 11 mm focus of restricted diffusion involving the posterior left basal ganglia, consistent with an acute ischemic perforator type infarct (series 5, image 73). No associated hemorrhage or mass effect. No other evidence for acute or subacute ischemia. No acute intracranial hemorrhage elsewhere within the brain. Multiple scattered chronic micro hemorrhages seen clustered about the, consistent with chronic poorly controlled hypertension. No mass lesion, midline shift or mass effect. No hydrocephalus. No extra-axial fluid collection. Pituitary gland and suprasellar region normal. Midline structures intact. Brainstem, and cerebellum Vascular: Major intracranial vascular flow voids are maintained. Skull and upper cervical spine: Craniocervical junction within normal limits. Bone marrow signal intensity normal. No scalp soft tissue abnormality. Sinuses/Orbits: Globes and orbital soft tissues demonstrate no acute finding. Paranasal sinuses  are largely clear. Trace right mastoid effusion, of doubtful significance. Inner ear structures normal. Other: None. IMPRESSION: 1. 11 mm acute ischemic nonhemorrhagic left basal ganglia infarct. 2. Underlying age-related cerebral atrophy with severe chronic microvascular ischemic  disease, with multiple remote lacunar infarcts as above. 3. Multiple scattered chronic micro hemorrhages clustered about the deep gray nuclei, brainstem, and cerebellum, consistent with chronic poorly controlled hypertension. Electronically Signed   By: Rise Mu M.D.   On: 09/15/2019 01:31   DG Chest Portable 1 View  Result Date: 09/15/2019 CLINICAL DATA:  CVA. Slurred speech. Right arm and leg weakness. EXAM: PORTABLE CHEST 1 VIEW COMPARISON:  Radiograph of 10/01/2018 FINDINGS: Borderline cardiomegaly stable from prior. Unchanged mediastinal contours with aortic tortuosity. Minor bibasilar atelectasis. No pulmonary edema, pleural fluid or pneumothorax. No acute osseous abnormalities are seen. IMPRESSION: 1. Borderline cardiomegaly, stable from prior. 2. Minor bibasilar atelectasis. Electronically Signed   By: Narda Rutherford M.D.   On: 09/15/2019 00:53   ECHOCARDIOGRAM COMPLETE  Result Date: 09/15/2019   ECHOCARDIOGRAM REPORT   Patient Name:   Bailey [name] Date of Exam: 09/15/2019 Medical Rec #:  [id number]        Height:       61.0 in Accession #:    [id number]       Weight:       200.0 lb Date of Birth:  [birth year]        BSA:          1.89 m Patient Age:    63 years         BP:           116/79 mmHg Patient Gender: F                HR:           78 bpm. Exam Location:  Inpatient Procedure: 2D Echo Indications:    Stroke 434.91 / I163.9  History:        Patient has no prior history of Echocardiogram examinations.                 Stroke; Risk Factors:Hypertension and Non-Smoker. Elevated                 Troponin, PTSD, Acute renal injury.  Sonographer:    Jeryl Columbia Referring Phys: [phone] KAREN M BLACK IMPRESSIONS  1. Left ventricular ejection fraction, by visual estimation, is 65 to 70%. The left ventricle has normal function. There is mildly increased left ventricular hypertrophy.  2. Small left ventricular internal cavity size.  3. The left ventricle has no regional wall motion  abnormalities.  4. Global right ventricle has normal systolic function.The right ventricular size is normal. No increase in right ventricular wall thickness.  5. Left atrial size was normal.  6. Right atrial size was normal.  7. Presence of pericardial fat pad.  8. Trivial pericardial effusion is present.  9. The mitral valve is grossly normal. No evidence of mitral valve regurgitation. 10. The tricuspid valve is grossly normal. Tricuspid valve regurgitation is trivial. 11. The aortic valve was not well visualized. Aortic valve regurgitation is not visualized. No evidence of aortic valve sclerosis or stenosis. 12. The pulmonic valve was not well visualized. Pulmonic valve regurgitation is not visualized. 13. TR signal is inadequate for assessing pulmonary artery systolic pressure. 14. The inferior vena cava is normal in size with greater than 50% respiratory variability, suggesting right atrial pressure of 3 mmHg. 15. No prior Echocardiogram. FINDINGS  Left Ventricle: Left ventricular ejection fraction, by visual estimation, is 65 to 70%. The left ventricle has normal function. The left ventricle has no regional wall motion abnormalities. The left ventricular internal cavity size was the LV cavity size is small. There is mildly increased left ventricular hypertrophy. Concentric left ventricular hypertrophy. Left ventricular diastolic parameters are consistent with age-related delayed relaxation (normal). Normal left atrial pressure. Right Ventricle: The right ventricular size is normal. No increase in right ventricular wall thickness. Global RV systolic function is has normal systolic function. Left Atrium: Left atrial size was normal in size. Right Atrium: Right atrial size was normal in size Pericardium: Trivial pericardial effusion is present. Presence of pericardial fat pad. Mitral Valve: The mitral valve is grossly normal. No evidence of mitral valve regurgitation. Tricuspid Valve: The tricuspid valve is  grossly normal. Tricuspid valve regurgitation is trivial. Aortic Valve: The aortic valve was not well visualized. Aortic valve regurgitation is not visualized. The aortic valve is structurally normal, with no evidence of sclerosis or stenosis. Pulmonic Valve: The pulmonic valve was not well visualized. Pulmonic valve regurgitation is not visualized. Pulmonic regurgitation is not visualized. Aorta: The aortic root is normal in size and structure. Venous: The inferior vena cava is normal in size with greater than 50% respiratory variability, suggesting right atrial pressure of 3 mmHg. IAS/Shunts: No atrial level shunt detected by color flow Doppler.  LEFT VENTRICLE PLAX 2D LVIDd:         3.20 cm  Diastology LVIDs:         2.10 cm  LV e' lateral:   8.27 cm/s LV PW:         1.40 cm  LV E/e' lateral: 6.5 LV IVS:        1.50 cm  LV e' medial:    6.96 cm/s LVOT diam:     1.90 cm  LV E/e' medial:  7.8 LV SV:         27 ml LV SV Index:   13.13 LVOT Area:     2.84 cm  RIGHT VENTRICLE TAPSE (M-mode): 2.4 cm LEFT ATRIUM           Index       RIGHT ATRIUM           Index LA diam:      4.10 cm 2.17 cm/m  RA Area:     14.90 cm LA Vol (A2C): 33.3 ml 17.63 ml/m RA Volume:   35.60 ml  18.85 ml/m LA Vol (A4C): 39.2 ml 20.75 ml/m   AORTA Ao Root diam: 2.90 cm MITRAL VALVE MV Area (PHT): 4.12 cm             SHUNTS MV PHT:        53.46 msec           Systemic Diam: 1.90 cm MV Decel Time: 184 msec MV E velocity: 54.15 cm/s 103 cm/s MV A velocity: 61.60 cm/s 70.3 cm/s MV E/A ratio:  0.88       1.5  Lennie Odor MD Electronically signed by Lennie Odor MD Signature Date/Time: 09/15/2019/10:39:30 AM    Final        Subjective:   Discharge Exam: Vitals:   09/17/19 0721 09/17/19 0930  BP: (!) 164/89 (!) 156/95  Pulse: 70 85  Resp: 20 19  Temp: 98.5 F (36.9 C)   SpO2: 95% 96%   Vitals:   09/16/19 1932 09/17/19 0055 09/17/19 0721 09/17/19 0930  BP: (!) 169/95 (!) 150/89 Marland Kitchen)  164/89 (!) 156/95  Pulse: 81 65 70 85   Resp: 20 (!) 21 20 19   Temp: 99.1 F (37.3 C) 98.1 F (36.7 C) 98.5 F (36.9 C)   TempSrc: Oral Oral Oral   SpO2: 95% 95% 95% 96%     General: Pt is alert, awake, not in acute distress Cardiovascular: RRR, S1/S2 +, no rubs, no gallops Respiratory: CTA bilaterally, no wheezing, no rhonchi Abdominal: Soft, NT, ND, bowel sounds + Extremities: no edema, no cyanosis.  Continues to have some right-sided upper arm deficits.    The results of significant diagnostics from this hospitalization (including imaging, microbiology, ancillary and laboratory) are listed below for reference.     Microbiology: Recent Results (from the past 240 hour(s))  SARS CORONAVIRUS 2 (TAT 6-24 HRS) Nasopharyngeal Nasopharyngeal Swab     Status: None   Collection Time: 09/15/19  1:17 AM   Specimen: Nasopharyngeal Swab  Result Value Ref Range Status   SARS Coronavirus 2 NEGATIVE NEGATIVE Final    Comment: (NOTE) SARS-CoV-2 target nucleic acids are NOT DETECTED. The SARS-CoV-2 RNA is generally detectable in upper and lower respiratory specimens during the acute phase of infection. Negative results do not preclude SARS-CoV-2 infection, do not rule out co-infections with other pathogens, and should not be used as the sole basis for treatment or other patient management decisions. Negative results must be combined with clinical observations, patient history, and epidemiological information. The expected result is Negative. Fact Sheet for Patients: HairSlick.no Fact Sheet for Healthcare Providers: quierodirigir.com This test is not yet approved or cleared by the [place] FDA and  has been authorized for detection and/or diagnosis of SARS-CoV-2 by FDA under an Emergency Use Authorization (EUA). This EUA will remain  in effect (meaning this test can be used) for the duration of the COVID-19 declaration under Section 56 4(b)(1) of the Act, 21  U.S.C. section 360bbb-3(b)(1), unless the authorization is terminated or revoked sooner. Performed at [facility], [address] [place], [place] [address]      Labs: BNP (last 3 results) Recent Labs    09/30/18 2124  BNP 40.9   Basic Metabolic Panel: Recent Labs  Lab 09/14/19 1126 09/15/19 0636 09/16/19 0249 09/17/19 0254  NA 143 146* 145 143  K 3.0* 4.0 3.3* 3.5  CL 102 107 106 106  CO2 30 30 28 29   GLUCOSE 96 96 113* 100*  BUN 15 13 12 10   CREATININE 0.94 0.91 0.86 0.79  CALCIUM 8.6* 8.8* 8.8* 8.7*  MG  --  1.6*  --   --    Liver Function Tests: Recent Labs  Lab 09/14/19 1126  AST 17  ALT 15  ALKPHOS 95  BILITOT 1.2  PROT 6.8  ALBUMIN 3.5   No results for input(s): LIPASE, AMYLASE in the last 168 hours. No results for input(s): AMMONIA in the last 168 hours. CBC: Recent Labs  Lab 09/14/19 1126  WBC 3.9*  NEUTROABS 2.2  HGB 12.6  HCT 35.5*  MCV 82.6  PLT 266   Cardiac Enzymes: No results for input(s): CKTOTAL, CKMB, CKMBINDEX, TROPONINI in the last 168 hours. BNP: Invalid input(s): POCBNP CBG: No results for input(s): GLUCAP in the last 168 hours. D-Dimer No results for input(s): DDIMER in the last 72 hours. Hgb A1c Recent Labs    09/15/19 0636  HGBA1C 5.2   Lipid Profile Recent Labs    09/15/19 0636  CHOL 175  HDL 53  LDLCALC 114*  TRIG 42  CHOLHDL 3.3  Thyroid function studies No results for input(s): TSH, T4TOTAL, T3FREE, THYROIDAB in the last 72 hours.  Invalid input(s): FREET3 Anemia work up No results for input(s): VITAMINB12, FOLATE, FERRITIN, TIBC, IRON, RETICCTPCT in the last 72 hours. Urinalysis    Component Value Date/Time   COLORURINE YELLOW 09/15/2019 1135   APPEARANCEUR CLEAR 09/15/2019 1135   LABSPEC >1.046 (H) 09/15/2019 1135   PHURINE 6.0 09/15/2019 1135   GLUCOSEU NEGATIVE 09/15/2019 1135   HGBUR NEGATIVE 09/15/2019 1135   BILIRUBINUR NEGATIVE 09/15/2019 1135   KETONESUR NEGATIVE 09/15/2019  1135   PROTEINUR NEGATIVE 09/15/2019 1135   UROBILINOGEN 2.0 (H) 05/17/2015 1516   NITRITE NEGATIVE 09/15/2019 1135   LEUKOCYTESUR NEGATIVE 09/15/2019 1135   Sepsis Labs Invalid input(s): PROCALCITONIN,  WBC,  LACTICIDVEN Microbiology Recent Results (from the past 240 hour(s))  SARS CORONAVIRUS 2 (TAT 6-24 HRS) Nasopharyngeal Nasopharyngeal Swab     Status: None   Collection Time: 09/15/19  1:17 AM   Specimen: Nasopharyngeal Swab  Result Value Ref Range Status   SARS Coronavirus 2 NEGATIVE NEGATIVE Final    Comment: (NOTE) SARS-CoV-2 target nucleic acids are NOT DETECTED. The SARS-CoV-2 RNA is generally detectable in upper and lower respiratory specimens during the acute phase of infection. Negative results do not preclude SARS-CoV-2 infection, do not rule out co-infections with other pathogens, and should not be used as the sole basis for treatment or other patient management decisions. Negative results must be combined with clinical observations, patient history, and epidemiological information. The expected result is Negative. Fact Sheet for Patients: SugarRoll.be Fact Sheet for Healthcare Providers: https://www.woods-mathews.com/ This test is not yet approved or cleared by the [place] FDA and  has been authorized for detection and/or diagnosis of SARS-CoV-2 by FDA under an Emergency Use Authorization (EUA). This EUA will remain  in effect (meaning this test can be used) for the duration of the COVID-19 declaration under Section 56 4(b)(1) of the Act, 21 U.S.C. section 360bbb-3(b)(1), unless the authorization is terminated or revoked sooner. Performed at [facility], [place] [address] [place], [place] [address]      Time coordinating discharge: Over 30 minutes  SIGNED:   Truett Mainland, DO [facility] 09/17/2019, 12:07 PM Pager   If 7PM-7AM, please contact night-coverage [email or web address] Password  [email or web address]

## 2019-09-17 NOTE — Progress Notes (Signed)
IV and tele removed without complication . Report called to [facility] at [facility]. Pt discharged from facility via [facility].

## 2019-09-17 NOTE — TOC Transition Note (Signed)
Transition of Care [facility]) - CM/SW Discharge Note   Patient Details  Name: Bailey [name] MRN: [id number] Date of Birth: 1956-07-12  [facility] CM/SW Contact:  Geralynn Ochs, LCSW Phone Number: 09/17/2019, 1:36 PM   Clinical Narrative:   Nurse to call report to [phone], Room 107    Final next level of care: Skilled Nursing Facility Barriers to Discharge: Barriers Resolved   Patient Goals and CMS Choice Patient states their goals for this hospitalization and ongoing recovery are:: be able to live on my own again CMS Medicare.gov Compare Post Acute Care list provided to:: Patient Choice offered to / list presented to : Patient  Discharge Placement              Patient chooses bed at: [facility] Patient to be transferred to facility by: [place] Name of family member notified: Margie Patient and family notified of of transfer: 09/17/19  Discharge Plan and Services     Post Acute Care Choice: [place]                               Social Determinants of Health (SDOH) Interventions     Readmission Risk Interventions No flowsheet data found.

## 2019-09-17 NOTE — Discharge Instructions (Signed)
Eating Plan After Stroke A stroke causes damage to the brain cells, which can affect your ability to walk, talk, and even eat. The impact of a stroke is different for everyone, and so is recovery. A good nutrition plan is important for your recovery. It can also lower your risk of another stroke. If you have difficulty chewing and swallowing your food, a dietitian or your stroke care team can help so that you can enjoy eating healthy foods. What are tips for following this plan?  Reading food labels  Choose foods that have less than 300 milligrams (mg) of sodium per serving. Limit your sodium intake to less than 1,500 mg per day.  Avoid foods that have saturated fat and trans fat.  Choose foods that are low in cholesterol. Limit the amount of cholesterol you eat each day to less than 200 mg.  Choose foods that are high in fiber. Eat 20-30 grams (g) of fiber each day.  Avoid foods with added sugar. Check the food label for ingredients such as sugar, corn syrup, honey, fructose, molasses, and cane juice. Shopping  At the grocery store, buy most of your food from areas near the walls of the store. This includes: ? Fresh fruits and vegetables. ? Dry grains, beans, nuts, and seeds. ? Fresh seafood, poultry, lean meats, and eggs. ? Low-fat dairy products.  Buy whole ingredients instead of prepackaged foods.  Buy fresh, in-season fruits and vegetables from local farmers markets.  Buy frozen fruits and vegetables in resealable bags. Cooking  Prepare foods with very little salt. Use herbs or salt-free spices instead.  Cook with heart-healthy oils, such as olive, avocado, canola, soybean, or sunflower oil.  Avoid frying foods. Bake, grill, or broil foods instead.  Remove visible fat and skin from meat and poultry before eating.  Modify food textures as told by your health care provider. Meal planning  Eat a wide variety of colorful fruits and vegetables. Make sure one-half of your  plate is filled with fruits and vegetables at each meal.  Eat fruits and vegetables that are high in potassium, such as: ? Apples, bananas, oranges, and melon. ? Sweet potatoes, spinach, zucchini, and tomatoes.  Eat fish that contain heart-healthy fats (omega-3 fats) at least twice a week. These include salmon, tuna, mackerel, and sardines.  Eat plant foods that are high in omega-3 fats, such as flaxseeds and walnuts. Add these to cereals, yogurt, or pasta dishes.  Eat several servings of high-fiber foods each day, such as fruits, vegetables, whole grains, and beans.  Do not put salt at the table for meals.  When eating out at restaurants: ? Ask the server about low-salt or salt-free food options. ? Avoid fried foods. Look for menu items that are grilled, steamed, broiled, or roasted. ? Ask if your food can be prepared without butter. ? Ask for condiments, such as salad dressings, gravy, or sauces to be served on the side.  If you have difficulty swallowing: ? Choose foods that are softer and easier to chew and swallow. ? Cut foods into small pieces and chew well before swallowing. ? Thicken liquids as told by your health care provider or dietitian. ? Let your health care provider know if your condition does not improve over time. You may need to work with a speech therapist to re-train the muscles that are used for eating. General recommendations  Involve your family and friends in your recovery, if possible. It may be helpful to have a slower meal time   and to plan meals that include foods everyone in the family can eat.  Brush your teeth with fluoride toothpaste twice a day, and floss once a day. Keeping a clean mouth can help you swallow and can also help your appetite.  Drink enough water each day to keep your urine pale yellow. If needed, set reminders or ask your family to help you remember to drink water.  Limit alcohol intake to no more than 1 drink a day for nonpregnant  women and 2 drinks a day for men. One drink equals 12 oz of beer, 5 oz of wine, or 1 oz of hard liquor. Summary  Following this eating plan can help in your stroke recovery and can decrease your risk for another stroke.  Let your health care provider know if you have problems with swallowing. You may need to work with a speech therapist. This information is not intended to replace advice given to you by your health care provider. Make sure you discuss any questions you have with your health care provider. Document Released: 11/24/2017 Document Revised: 01/07/2019 Document Reviewed: 11/24/2017 Elsevier Patient Education  2020 [employer]  

## 2019-09-20 ENCOUNTER — Non-Acute Institutional Stay (SKILLED_NURSING_FACILITY): Payer: Medicare Other | Admitting: Internal Medicine

## 2019-09-20 ENCOUNTER — Encounter: Payer: Self-pay | Admitting: Internal Medicine

## 2019-09-20 DIAGNOSIS — E785 Hyperlipidemia, unspecified: Secondary | ICD-10-CM

## 2019-09-20 DIAGNOSIS — E876 Hypokalemia: Secondary | ICD-10-CM | POA: Diagnosis not present

## 2019-09-20 DIAGNOSIS — I10 Essential (primary) hypertension: Secondary | ICD-10-CM

## 2019-09-20 DIAGNOSIS — I639 Cerebral infarction, unspecified: Secondary | ICD-10-CM

## 2019-09-20 DIAGNOSIS — F411 Generalized anxiety disorder: Secondary | ICD-10-CM

## 2019-09-20 LAB — BASIC METABOLIC PANEL
BUN: 13 (ref 4–21)
CO2: 27 — AB (ref 13–22)
Chloride: 104 (ref 99–108)
Creatinine: 0.7 (ref 0.5–1.1)
Glucose: 86
Potassium: 3.6 (ref 3.4–5.3)
Sodium: 144 (ref 137–147)

## 2019-09-20 LAB — CBC AND DIFFERENTIAL
HCT: 34 — AB (ref 36–46)
Hemoglobin: 12.1 (ref 12.0–16.0)
Neutrophils Absolute: 3
Platelets: 235 (ref 150–399)
WBC: 5.2

## 2019-09-20 LAB — COMPREHENSIVE METABOLIC PANEL
Calcium: 8.4 — AB (ref 8.7–10.7)
GFR calc Af Amer: 90
GFR calc non Af Amer: 89.41

## 2019-09-20 LAB — CBC: RBC: 4.07 (ref 3.87–5.11)

## 2019-09-20 NOTE — Progress Notes (Signed)
: Provider:  Margit Hanks., MD Location:  [name] Room Number: 107-P Place of Service:  SNF (31)  PCP: Georgann Housekeeper, MD Patient Care Team: Georgann Housekeeper, MD as PCP - General (Internal Medicine) Georgann Housekeeper, MD (Internal Medicine)  Extended Emergency Contact Information Primary Emergency Contact: Green,Margie Address: [address]          [place], [place] [address] [place] of [place] Home Phone: [phone] Mobile Phone: [phone] Relation: Mother Secondary Emergency Contact: Evern Bio [place] Home Phone: [phone] Relation: Sister     Allergies: Codeine, Sulfa antibiotics, and Sulfa drugs cross reactors  Chief Complaint  Patient presents with   New Admit To SNF    New admission to [facility] visit    HPI: Patient is a 63 y.o. female with hypertension, general anxiety disorder, depression, with history of prior stroke with right-sided weakness who presented with complaints of slurred speech and worsening right-sided weakness.  Last known to be normal on 12/13 when she went to bed.  Symptoms have been ongoing for 2 days.  CT head negative for acute intracranial abnormality neurology consulted patient received ASA 324 mg and patient was admitted to [facility] from 12/15-18Where she was found to have a nonhemorrhagic left basal ganglia infarct with right-sided deficits.  She was started on aspirin and Plavix.  Patient is admitted to skilled nursing facility for OT/PT. while at skilled nursing facility patient will be followed for hypertension treated with Norvasc clonidine losartan and Toprol-XL, hyperlipidemia treated with Crestor and anxiety treated with Celexa and clonazepam.     Past Medical History:  Diagnosis Date   Anxiety    "Breakdown" in the 1990's, hosp. for >1 wk. at [facility]     Anxiety    Arthritis    OA- R knee, L hip    Asthma    years ago. "no problems in a long  while."   Constipation    Depression    GERD (gastroesophageal reflux disease)    Goiter    removed  at age 29   Hypertension    all BP meds. managed by Dr. Eula Listen     Hypertension    Incontinence of urine    weak bladder   Pneumonia    while at [facility] 1990's told that she had pneumonia & was isolated    Stroke [facility])    2005, R sided weakness    Stroke [facility]) 2005   ? weakness in hand and arms per mother. patient unsure    Past Surgical History:  Procedure Laterality Date   ABDOMINAL HYSTERECTOMY     COLONOSCOPY     DILATION AND CURETTAGE OF UTERUS     1980's   goiter     removed age 37   JOINT REPLACEMENT  10/2011   Rt knee   JOINT REPLACEMENT Right    knee   THYROIDECTOMY, PARTIAL     TONSILLECTOMY     TOTAL HIP ARTHROPLASTY  12/23/2011   Procedure: TOTAL HIP ARTHROPLASTY;  Surgeon: Nestor Lewandowsky, MD;  Location: [facility] OR;  Service: Orthopedics;  Laterality: Left;  DEPUY/PINNACLE CUPS/SROM STEM   TOTAL HIP ARTHROPLASTY Left    TOTAL HIP ARTHROPLASTY Right 02/15/2015   Procedure: TOTAL HIP ARTHROPLASTY;  Surgeon: Gean Birchwood, MD;  Location: [facility] OR;  Service: Orthopedics;  Laterality: Right;   TOTAL KNEE ARTHROPLASTY  10/21/2011   Procedure: TOTAL KNEE ARTHROPLASTY;  Surgeon: Nestor Lewandowsky, MD;  Location: [facility];  Service: Orthopedics;  Laterality: Right;  DEPUY SIGMA RP   TUBAL LIGATION      Allergies as of 09/20/2019      Reactions   Codeine Nausea Only, Other (See Comments)   headaches   Sulfa Antibiotics Nausea Only   Upset stomach, headaches   Sulfa Drugs Cross Reactors Other (See Comments)   Its been a long time ago      Medication List       Accurate as of September 20, 2019 11:59 PM. If you have any questions, ask your nurse or doctor.        acetaminophen 325 MG tablet Commonly known as: TYLENOL Take 2 tablets (650 mg total) by mouth every 6 (six) hours as needed for mild pain (or Fever >/= 101).   amLODipine 2.5 MG  tablet Commonly known as: NORVASC Take 2.5 mg by mouth daily.   aspirin EC 325 MG tablet Take 1 tablet (325 mg total) by mouth 2 (two) times daily.   citalopram 40 MG tablet Commonly known as: CELEXA Take 40 mg by mouth daily.   clonazePAM 0.5 MG tablet Commonly known as: KLONOPIN Take 0.5 mg by mouth 2 (two) times daily.   cloNIDine 0.2 MG tablet Commonly known as: CATAPRES Take 1 tablet (0.2 mg total) by mouth 2 (two) times daily.   clopidogrel 75 MG tablet Commonly known as: PLAVIX Take 1 tablet (75 mg total) by mouth daily.   doxazosin 8 MG tablet Commonly known as: CARDURA Take 8 mg by mouth at bedtime.   FLAX SEED OIL PO Take 1,200 mg by mouth daily.   losartan 100 MG tablet Commonly known as: COZAAR Take 100 mg by mouth daily.   methocarbamol 500 MG tablet Commonly known as: ROBAXIN Take 500 mg by mouth 3 (three) times daily.   metoprolol tartrate 25 MG tablet Commonly known as: LOPRESSOR Take 0.5 tablets (12.5 mg total) by mouth 2 (two) times daily.   omeprazole 20 MG capsule Commonly known as: PRILOSEC Take 40 mg by mouth daily.   potassium chloride 20 MEQ packet Commonly known as: KLOR-CON Take 20 mEq by mouth 2 (two) times daily.   QUEtiapine 50 MG tablet Commonly known as: SEROQUEL Take 1 tablet (50 mg total) by mouth at bedtime.   rosuvastatin 20 MG tablet Commonly known as: CRESTOR Take 1 tablet (20 mg total) by mouth daily at 6 PM.   VITAMIN B 12 PO Take 1 tablet by mouth daily.   vitamin C 500 MG tablet Commonly known as: ASCORBIC ACID Take 500 mg by mouth daily.   Vitamin D 50 MCG (2000 UT) tablet Take 2,000 Units by mouth daily.       No orders of the defined types were placed in this encounter.   Immunization History  Administered Date(s) Administered   Influenza-Unspecified 05/31/2014, 07/01/2019   PPD Test 03/01/2015    Social History   Tobacco Use   Smoking status: Never Smoker   Smokeless tobacco: Never  Used  Substance Use Topics   Alcohol use: Yes    Comment: 2x's /month, socially      Family history is   Family History  Problem Relation Age of Onset   Anesthesia problems Neg Hx    Hypotension Neg Hx    Malignant hyperthermia Neg Hx    Pseudochol deficiency Neg Hx    Alcohol abuse Neg Hx    Anxiety disorder Neg Hx    Bipolar disorder Neg Hx  Depression Neg Hx    Dementia Neg Hx    Breast cancer Neg Hx       Review of Systems  DATA OBTAINED: from patient GENERAL:  no fevers, fatigue, appetite changes SKIN: No itching, or rash EYES: No eye pain, redness, discharge EARS: No earache, tinnitus, change in hearing NOSE: No congestion, drainage or bleeding  MOUTH/THROAT: No mouth or tooth pain, No sore throat RESPIRATORY: No cough, wheezing, SOB CARDIAC: No chest pain, palpitations, lower extremity edema  GI: No abdominal pain, No N/V/D or constipation, No heartburn or reflux  GU: No dysuria, frequency or urgency, or incontinence  MUSCULOSKELETAL: No unrelieved bone/joint pain NEUROLOGIC: No headache, dizziness; right facial weakness right-sided weakness some slurred speech PSYCHIATRIC: No c/o anxiety or sadness   Vitals:   09/20/19 1219  BP: (!) 165/81  Pulse: 64  Resp: 18  Temp: 97.7 F (36.5 C)  SpO2: 95%    SpO2 Readings from Last 1 Encounters:  09/20/19 95%   Body mass index is 37.79 kg/m.     Physical Exam  GENERAL APPEARANCE: Alert, conversant,  No acute distress.  SKIN: No diaphoresis rash HEAD: Normocephalic, atraumatic  EYES: Conjunctiva/lids clear. Pupils round, reactive. EOMs intact.  EARS: External exam WNL, canals clear. Hearing grossly normal.  NOSE: No deformity or discharge.  MOUTH/THROAT: Lips w/o lesions  RESPIRATORY: Breathing is even, unlabored. Lung sounds are clear   CARDIOVASCULAR: Heart RRR no murmurs, rubs or gallops. No peripheral edema.   GASTROINTESTINAL: Abdomen is soft, non-tender, not distended w/ normal bowel  sounds. GENITOURINARY: Bladder non tender, not distended  MUSCULOSKELETAL: No abnormal joints or musculature NEUROLOGIC: Right facial weakness, slurred speech but able to understand when she speaks, right-sided weakness PSYCHIATRIC: Mood and affect appropriate to situation, no behavioral issues  Patient Active Problem List   Diagnosis Date Noted   Hypomagnesemia 09/17/2019   Acute CVA (cerebrovascular accident) (HCC) 09/15/2019   Depression 09/15/2019   Hyperreflexia 03/11/2017   Mild cognitive impairment 03/11/2017   Syncopal episodes 05/16/2015   Acute renal injury (HCC) 05/16/2015   Elevated troponin 05/16/2015   Hypertension 05/16/2015   Non compliance w medication regimen 05/16/2015   Pulmonary nodule 05/16/2015   Pressure ulcer 02/28/2015   Dehydration 02/26/2015   Intractable vomiting with nausea 02/26/2015   Hypokalemia 02/25/2015   Abdominal pain    Primary osteoarthritis of right hip 02/15/2015   Severe recurrent major depressive disorder with psychotic features (HCC) 09/01/2014   GAD (generalized anxiety disorder) 09/01/2014   PTSD (post-traumatic stress disorder) 09/01/2014   Hip arthritis left 12/23/2011   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 10/21/2011      Labs reviewed: Basic Metabolic Panel:    Component Value Date/Time   NA 144 09/20/2019 0000   K 3.6 09/20/2019 0000   CL 104 09/20/2019 0000   CO2 27 (A) 09/20/2019 0000   GLUCOSE 100 (H) 09/17/2019 0254   BUN 13 09/20/2019 0000   CREATININE 0.7 09/20/2019 0000   CREATININE 0.79 09/17/2019 0254   CALCIUM 8.4 (A) 09/20/2019 0000   PROT 6.8 09/14/2019 1126   ALBUMIN 3.5 09/14/2019 1126   AST 17 09/14/2019 1126   ALT 15 09/14/2019 1126   ALKPHOS 95 09/14/2019 1126   BILITOT 1.2 09/14/2019 1126   GFRNONAA 89.41 09/20/2019 0000   GFRAA 90 09/20/2019 0000    Recent Labs    09/15/19 0636 09/16/19 0249 09/17/19 0254 09/20/19 0000  NA 146* 145 143 144  K 4.0 3.3*  3.5 3.6  CL 107 106 106 104  CO2 30 28 29  27*  GLUCOSE 96 113* 100*  --   BUN 13 12 10 13   CREATININE 0.91 0.86 0.79 0.7  CALCIUM 8.8* 8.8* 8.7* 8.4*  MG 1.6*  --   --   --    Liver Function Tests: Recent Labs    09/14/19 1126  AST 17  ALT 15  ALKPHOS 95  BILITOT 1.2  PROT 6.8  ALBUMIN 3.5   No results for input(s): LIPASE, AMYLASE in the last 8760 hours. No results for input(s): AMMONIA in the last 8760 hours. CBC: Recent Labs    09/30/18 2124 09/14/19 1126 09/20/19 0000  WBC 3.9* 3.9* 5.2  NEUTROABS  --  2.2 3  HGB 12.8 12.6 12.1  HCT 35.4* 35.5* 34*  MCV 81.9 82.6  --   PLT 261 266 235   Lipid Recent Labs    09/15/19 0636  CHOL 175  HDL 53  LDLCALC 114*  TRIG 42    Cardiac Enzymes: No results for input(s): CKTOTAL, CKMB, CKMBINDEX, TROPONINI in the last 8760 hours. BNP: Recent Labs    09/30/18 2124  BNP 40.9   No results found for: [facility] Lab Results  Component Value Date   HGBA1C 5.2 09/15/2019   Lab Results  Component Value Date   TSH 0.174 (L) 05/17/2015   No results found for: VITAMINB12 No results found for: FOLATE No results found for: IRON, TIBC, FERRITIN  Imaging and Procedures obtained prior to SNF admission: CT ANGIO HEAD W OR WO CONTRAST  Result Date: 09/15/2019 CLINICAL DATA:  Follow-up examination for acute stroke. EXAM: CT ANGIOGRAPHY HEAD AND NECK TECHNIQUE: Multidetector CT imaging of the head and neck was performed using the standard protocol during bolus administration of intravenous contrast. Multiplanar CT image reconstructions and MIPs were obtained to evaluate the vascular anatomy. Carotid stenosis measurements (when applicable) are obtained utilizing NASCET criteria, using the distal internal carotid diameter as the denominator. CONTRAST:  80mL OMNIPAQUE IOHEXOL 350 MG/ML SOLN COMPARISON:  Prior MRI from earlier the same day. FINDINGS: CTA NECK FINDINGS Aortic arch: Visualized aortic arch of normal caliber with normal  branch pattern. No hemodynamically significant stenosis seen about the origin of the great vessels. Visualized subclavian arteries widely patent. Right carotid system: Right common and internal carotid arteries are diffusely tortuous but widely patent to the skull base without stenosis, dissection or occlusion. Right carotid artery system medialized into the retropharyngeal space. Left carotid system: Left common and internal carotid arteries are diffusely tortuous but widely patent to the skull base without stenosis, dissection or occlusion. Left carotid artery system medialized into the retropharyngeal space. Vertebral arteries: Both vertebral arteries arise from the subclavian arteries. Vertebral arteries mildly tortuous but widely patent within the neck without stenosis, dissection or occlusion. Skeleton: No acute osseous finding. No discrete lytic or blastic osseous lesions. Other neck: No other acute soft tissue abnormality within the neck. Enlarged multinodular right lobe of thyroid is seen. Dominant 19 mm hypodense right thyroid not (series 6, image 116). Left thyroid lobe hypoplastic and/or absent. Upper chest: Visualized upper chest demonstrates no acute finding. Partially visualized lungs are largely clear. Review of the MIP images confirms the above findings CTA HEAD FINDINGS Anterior circulation: Both petrous segments widely patent. Mild scattered atheromatous plaque within the cavernous/supraclinoid ICAs without significant stenosis. A1 segments widely patent. Normal anterior communicating artery. Anterior cerebral arteries widely patent to their distal aspects without stenosis. M1 segments widely patent bilaterally. Normal MCA bifurcations. Distal  MCA branches well perfused and symmetric. Posterior circulation: Vertebral arteries mildly irregular but patent to the vertebrobasilar junction without flow-limiting stenosis. Patent right PICA. Left PICA not seen. Basilar widely patent to its distal aspect.  Basilar tip somewhat ectatic without discrete aneurysm. Superior cerebral arteries patent bilaterally. Right PCA supplied via the basilar as well as a small right posterior communicating artery. Fetal type origin of the left PCA. PCAs mildly irregular but are well perfused to their distal aspects without significant stenosis. Venous sinuses: Grossly patent allowing for timing the contrast bolus. Anatomic variants: Fetal type origin left PCA. No intracranial aneurysm or other vascular abnormality. Review of the MIP images confirms the above findings IMPRESSION: 1. Negative CTA for large vessel occlusion. No high-grade or correctable stenosis identified. 2. Diffuse tortuosity of the major arterial vasculature of the head and neck, suggesting chronic underlying hypertension. 3. Enlarged multinodular right lobe of thyroid with dominant 19 mm right thyroid nodule. Further evaluation with dedicated thyroid ultrasound recommended. This could be performed on a nonemergent basis.(ref: J Am Coll Radiol. 2015 Feb;12(2): 143-50). Electronically Signed   By: Rise Mu M.D.   On: 09/15/2019 04:06   CT HEAD WO CONTRAST  Result Date: 09/14/2019 CLINICAL DATA:  Slurred speech, right-sided weakness. EXAM: CT HEAD WITHOUT CONTRAST TECHNIQUE: Contiguous axial images were obtained from the base of the skull through the vertex without intravenous contrast. COMPARISON:  Feb 26, 2019. FINDINGS: Brain: Mild chronic ischemic white matter disease is noted. No mass effect or midline shift is noted. Ventricular size is within normal limits. There is no evidence of mass lesion, hemorrhage or acute infarction. Vascular: No hyperdense vessel or unexpected calcification. Skull: Normal. Negative for fracture or focal lesion. Sinuses/Orbits: No acute finding. Other: None. IMPRESSION: Mild chronic ischemic white matter disease. No acute intracranial abnormality seen. Electronically Signed   By: Lupita Raider M.D.   On: 09/14/2019  11:49   CT ANGIO NECK W OR WO CONTRAST  Result Date: 09/15/2019 CLINICAL DATA:  Follow-up examination for acute stroke. EXAM: CT ANGIOGRAPHY HEAD AND NECK TECHNIQUE: Multidetector CT imaging of the head and neck was performed using the standard protocol during bolus administration of intravenous contrast. Multiplanar CT image reconstructions and MIPs were obtained to evaluate the vascular anatomy. Carotid stenosis measurements (when applicable) are obtained utilizing NASCET criteria, using the distal internal carotid diameter as the denominator. CONTRAST:  80mL OMNIPAQUE IOHEXOL 350 MG/ML SOLN COMPARISON:  Prior MRI from earlier the same day. FINDINGS: CTA NECK FINDINGS Aortic arch: Visualized aortic arch of normal caliber with normal branch pattern. No hemodynamically significant stenosis seen about the origin of the great vessels. Visualized subclavian arteries widely patent. Right carotid system: Right common and internal carotid arteries are diffusely tortuous but widely patent to the skull base without stenosis, dissection or occlusion. Right carotid artery system medialized into the retropharyngeal space. Left carotid system: Left common and internal carotid arteries are diffusely tortuous but widely patent to the skull base without stenosis, dissection or occlusion. Left carotid artery system medialized into the retropharyngeal space. Vertebral arteries: Both vertebral arteries arise from the subclavian arteries. Vertebral arteries mildly tortuous but widely patent within the neck without stenosis, dissection or occlusion. Skeleton: No acute osseous finding. No discrete lytic or blastic osseous lesions. Other neck: No other acute soft tissue abnormality within the neck. Enlarged multinodular right lobe of thyroid is seen. Dominant 19 mm hypodense right thyroid not (series 6, image 116). Left thyroid lobe hypoplastic and/or absent. Upper chest: Visualized upper  chest demonstrates no acute finding.  Partially visualized lungs are largely clear. Review of the MIP images confirms the above findings CTA HEAD FINDINGS Anterior circulation: Both petrous segments widely patent. Mild scattered atheromatous plaque within the cavernous/supraclinoid ICAs without significant stenosis. A1 segments widely patent. Normal anterior communicating artery. Anterior cerebral arteries widely patent to their distal aspects without stenosis. M1 segments widely patent bilaterally. Normal MCA bifurcations. Distal MCA branches well perfused and symmetric. Posterior circulation: Vertebral arteries mildly irregular but patent to the vertebrobasilar junction without flow-limiting stenosis. Patent right PICA. Left PICA not seen. Basilar widely patent to its distal aspect. Basilar tip somewhat ectatic without discrete aneurysm. Superior cerebral arteries patent bilaterally. Right PCA supplied via the basilar as well as a small right posterior communicating artery. Fetal type origin of the left PCA. PCAs mildly irregular but are well perfused to their distal aspects without significant stenosis. Venous sinuses: Grossly patent allowing for timing the contrast bolus. Anatomic variants: Fetal type origin left PCA. No intracranial aneurysm or other vascular abnormality. Review of the MIP images confirms the above findings IMPRESSION: 1. Negative CTA for large vessel occlusion. No high-grade or correctable stenosis identified. 2. Diffuse tortuosity of the major arterial vasculature of the head and neck, suggesting chronic underlying hypertension. 3. Enlarged multinodular right lobe of thyroid with dominant 19 mm right thyroid nodule. Further evaluation with dedicated thyroid ultrasound recommended. This could be performed on a nonemergent basis.(ref: J Am Coll Radiol. 2015 Feb;12(2): 143-50). Electronically Signed   By: Rise Mu M.D.   On: 09/15/2019 04:06   MR BRAIN WO CONTRAST  Result Date: 09/15/2019 CLINICAL DATA:  Initial  evaluation for acute weakness. EXAM: MRI HEAD WITHOUT CONTRAST TECHNIQUE: Multiplanar, multiecho pulse sequences of the brain and surrounding structures were obtained without intravenous contrast. COMPARISON:  Prior CT from 09/14/2019. FINDINGS: Brain: Generalized age-related cerebral atrophy. Patchy and confluent T2/FLAIR hyperintensity within the periventricular deep white matter both cerebral hemispheres most consistent with chronic small vessel ischemic disease, severe in nature. Multiple scatter remote lacunar infarcts seen involving the bilateral basal ganglia, thalami, and periventricular white matter of the corona radiata. Associated chronic hemosiderin staining seen about several of these infarcts. Few scattered remote subcentimeter bilateral cerebellar infarcts noted as well. There is a 11 mm focus of restricted diffusion involving the posterior left basal ganglia, consistent with an acute ischemic perforator type infarct (series 5, image 73). No associated hemorrhage or mass effect. No other evidence for acute or subacute ischemia. No acute intracranial hemorrhage elsewhere within the brain. Multiple scattered chronic micro hemorrhages seen clustered about the, consistent with chronic poorly controlled hypertension. No mass lesion, midline shift or mass effect. No hydrocephalus. No extra-axial fluid collection. Pituitary gland and suprasellar region normal. Midline structures intact. Brainstem, and cerebellum Vascular: Major intracranial vascular flow voids are maintained. Skull and upper cervical spine: Craniocervical junction within normal limits. Bone marrow signal intensity normal. No scalp soft tissue abnormality. Sinuses/Orbits: Globes and orbital soft tissues demonstrate no acute finding. Paranasal sinuses are largely clear. Trace right mastoid effusion, of doubtful significance. Inner ear structures normal. Other: None. IMPRESSION: 1. 11 mm acute ischemic nonhemorrhagic left basal ganglia infarct.  2. Underlying age-related cerebral atrophy with severe chronic microvascular ischemic disease, with multiple remote lacunar infarcts as above. 3. Multiple scattered chronic micro hemorrhages clustered about the deep gray nuclei, brainstem, and cerebellum, consistent with chronic poorly controlled hypertension. Electronically Signed   By: Rise Mu M.D.   On: 09/15/2019 01:31   DG Chest Portable  1 View  Result Date: 09/15/2019 CLINICAL DATA:  CVA. Slurred speech. Right arm and leg weakness. EXAM: PORTABLE CHEST 1 VIEW COMPARISON:  Radiograph of 10/01/2018 FINDINGS: Borderline cardiomegaly stable from prior. Unchanged mediastinal contours with aortic tortuosity. Minor bibasilar atelectasis. No pulmonary edema, pleural fluid or pneumothorax. No acute osseous abnormalities are seen. IMPRESSION: 1. Borderline cardiomegaly, stable from prior. 2. Minor bibasilar atelectasis. Electronically Signed   By: Narda Rutherford M.D.   On: 09/15/2019 00:53   ECHOCARDIOGRAM COMPLETE  Result Date: 09/15/2019   ECHOCARDIOGRAM REPORT   Patient Name:   Bailey [name] Date of Exam: 09/15/2019 Medical Rec #:  [id number]        Height:       61.0 in Accession #:    [id number]       Weight:       200.0 lb Date of Birth:  09-24-1956        BSA:          1.89 m Patient Age:    63 years         BP:           116/79 mmHg Patient Gender: F                HR:           78 bpm. Exam Location:  Inpatient Procedure: 2D Echo Indications:    Stroke 434.91 / I163.9  History:        Patient has no prior history of Echocardiogram examinations.                 Stroke; Risk Factors:Hypertension and Non-Smoker. Elevated                 Troponin, PTSD, Acute renal injury.  Sonographer:    Jeryl Columbia Referring Phys: [id number] KAREN M BLACK IMPRESSIONS  1. Left ventricular ejection fraction, by visual estimation, is 65 to 70%. The left ventricle has normal function. There is mildly increased left ventricular hypertrophy.  2. Small left  ventricular internal cavity size.  3. The left ventricle has no regional wall motion abnormalities.  4. Global right ventricle has normal systolic function.The right ventricular size is normal. No increase in right ventricular wall thickness.  5. Left atrial size was normal.  6. Right atrial size was normal.  7. Presence of pericardial fat pad.  8. Trivial pericardial effusion is present.  9. The mitral valve is grossly normal. No evidence of mitral valve regurgitation. 10. The tricuspid valve is grossly normal. Tricuspid valve regurgitation is trivial. 11. The aortic valve was not well visualized. Aortic valve regurgitation is not visualized. No evidence of aortic valve sclerosis or stenosis. 12. The pulmonic valve was not well visualized. Pulmonic valve regurgitation is not visualized. 13. TR signal is inadequate for assessing pulmonary artery systolic pressure. 14. The inferior vena cava is normal in size with greater than 50% respiratory variability, suggesting right atrial pressure of 3 mmHg. 15. No prior Echocardiogram. FINDINGS  Left Ventricle: Left ventricular ejection fraction, by visual estimation, is 65 to 70%. The left ventricle has normal function. The left ventricle has no regional wall motion abnormalities. The left ventricular internal cavity size was the LV cavity size is small. There is mildly increased left ventricular hypertrophy. Concentric left ventricular hypertrophy. Left ventricular diastolic parameters are consistent with age-related delayed relaxation (normal). Normal left atrial pressure. Right Ventricle: The right ventricular size is normal. No increase in right ventricular wall thickness. Global RV systolic  function is has normal systolic function. Left Atrium: Left atrial size was normal in size. Right Atrium: Right atrial size was normal in size Pericardium: Trivial pericardial effusion is present. Presence of pericardial fat pad. Mitral Valve: The mitral valve is grossly normal. No  evidence of mitral valve regurgitation. Tricuspid Valve: The tricuspid valve is grossly normal. Tricuspid valve regurgitation is trivial. Aortic Valve: The aortic valve was not well visualized. Aortic valve regurgitation is not visualized. The aortic valve is structurally normal, with no evidence of sclerosis or stenosis. Pulmonic Valve: The pulmonic valve was not well visualized. Pulmonic valve regurgitation is not visualized. Pulmonic regurgitation is not visualized. Aorta: The aortic root is normal in size and structure. Venous: The inferior vena cava is normal in size with greater than 50% respiratory variability, suggesting right atrial pressure of 3 mmHg. IAS/Shunts: No atrial level shunt detected by color flow Doppler.  LEFT VENTRICLE PLAX 2D LVIDd:         3.20 cm  Diastology LVIDs:         2.10 cm  LV e' lateral:   8.27 cm/s LV PW:         1.40 cm  LV E/e' lateral: 6.5 LV IVS:        1.50 cm  LV e' medial:    6.96 cm/s LVOT diam:     1.90 cm  LV E/e' medial:  7.8 LV SV:         27 ml LV SV Index:   13.13 LVOT Area:     2.84 cm  RIGHT VENTRICLE TAPSE (M-mode): 2.4 cm LEFT ATRIUM           Index       RIGHT ATRIUM           Index LA diam:      4.10 cm 2.17 cm/m  RA Area:     14.90 cm LA Vol (A2C): 33.3 ml 17.63 ml/m RA Volume:   35.60 ml  18.85 ml/m LA Vol (A4C): 39.2 ml 20.75 ml/m   AORTA Ao Root diam: 2.90 cm MITRAL VALVE MV Area (PHT): 4.12 cm             SHUNTS MV PHT:        53.46 msec           Systemic Diam: 1.90 cm MV Decel Time: 184 msec MV E velocity: 54.15 cm/s 103 cm/s MV A velocity: 61.60 cm/s 70.3 cm/s MV E/A ratio:  0.88       1.5  Lennie Odor MD Electronically signed by Lennie Odor MD Signature Date/Time: 09/15/2019/10:39:30 AM    Final      Not all labs, radiology exams or other studies done during hospitalization come through on my EPIC note; however they are reviewed by me.    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-assessed by  neurology, and started on aspirin and Plavix SNF-admitted for OT/PT continue ASA 325 mg 2 times daily and Plavix 75 mg daily  Hypokalemia/hypomagnesemia-repleted SNF-follow-up CMP  Hypertension SNF-systolic blood pressure 160; continue current medications and titrate up Norvasc 2.5 mg daily, clonidine 0.3 mg twice daily, losartan 100 mg daily and metoprolol 12.5 mg twice daily  Hyperlipidemia SNF-not stated as uncontrolled; continue Crestor 20 mg daily  General anxiety disorder SNF-continue Celexa 40 mg daily and clonazepam 0.5 mg twice daily   Time spent greater than 45 minutes;> 50% of time with patient was spent reviewing records, labs, tests and studies, counseling  and developing plan of care  Margit Hanks, MD

## 2019-09-21 ENCOUNTER — Encounter: Payer: Self-pay | Admitting: Internal Medicine

## 2019-09-24 ENCOUNTER — Encounter: Payer: Self-pay | Admitting: Internal Medicine

## 2019-09-26 DIAGNOSIS — E785 Hyperlipidemia, unspecified: Secondary | ICD-10-CM | POA: Insufficient documentation

## 2019-09-28 ENCOUNTER — Non-Acute Institutional Stay (SKILLED_NURSING_FACILITY): Payer: Medicare Other | Admitting: Internal Medicine

## 2019-09-28 DIAGNOSIS — L509 Urticaria, unspecified: Secondary | ICD-10-CM | POA: Diagnosis not present

## 2019-09-28 DIAGNOSIS — R062 Wheezing: Secondary | ICD-10-CM

## 2019-09-29 ENCOUNTER — Other Ambulatory Visit: Payer: Self-pay | Admitting: Internal Medicine

## 2019-09-29 ENCOUNTER — Encounter: Payer: Self-pay | Admitting: Internal Medicine

## 2019-09-29 MED ORDER — CLONAZEPAM 0.5 MG PO TABS: 0.5000 mg | ORAL_TABLET | Freq: Two times a day (BID) | ORAL | 0 refills | Status: DC

## 2019-09-30 NOTE — Progress Notes (Signed)
Location:  [facility] Room Number: 506-P Place of Service:  SNF (31)  Georgann Housekeeper, MD  Patient Care Team: Georgann Housekeeper, MD as PCP - General (Internal Medicine) Georgann Housekeeper, MD (Internal Medicine)  Extended Emergency Contact Information Primary Emergency Contact: Green,Margie Address: [address]          [place], [place] [address] [place] Home Phone: [phone] Mobile Phone: [phone] Relation: Mother Secondary Emergency Contact: Richardson,Cynthia  [place] Home Phone: [phone] Relation: Sister    Allergies: Codeine, Sulfa antibiotics, and Sulfa drugs cross reactors  Chief Complaint  Patient presents with  . Acute Visit    Patient is seen for a rash.     HPI: Patient is a 63 y.o. female who is being seen for a rash over her upper and lower extremities and trunk which is very itchy and which has been going on for several days.  Nursing reports that patient at times is screaming and yelling but she has been doing this because she has been very emotionally labile since her stroke.  Past Medical History:  Diagnosis Date  . Anxiety    "Breakdown" in the 1990's, hosp. for >1 wk. at [facility]    . Anxiety   . Arthritis    OA- R knee, L hip   . Asthma    years ago. "no problems in a long while."  . Constipation   . Depression   . GERD (gastroesophageal reflux disease)   . Goiter    removed  at age 46  . Hypertension    all BP meds. managed by Dr. Eula Listen    . Hypertension   . Incontinence of urine    weak bladder  . Pneumonia    while at [facility] 1990's told that she had pneumonia & was isolated   . Stroke (HCC)    2005, R sided weakness   . Stroke [facility]) 2005   ? weakness in hand and arms per mother. patient unsure    Past Surgical History:  Procedure Laterality Date  . ABDOMINAL HYSTERECTOMY    . COLONOSCOPY    . DILATION AND CURETTAGE OF UTERUS     1980's  . goiter     removed age 38  . JOINT REPLACEMENT  10/2011   Rt knee  . JOINT REPLACEMENT Right    knee  . THYROIDECTOMY, PARTIAL    . TONSILLECTOMY    . TOTAL HIP ARTHROPLASTY  12/23/2011   Procedure: TOTAL HIP ARTHROPLASTY;  Surgeon: Nestor Lewandowsky, MD;  Location: [facility] OR;  Service: Orthopedics;  Laterality: Left;  DEPUY/PINNACLE CUPS/SROM STEM  . TOTAL HIP ARTHROPLASTY Left   . TOTAL HIP ARTHROPLASTY Right 02/15/2015   Procedure: TOTAL HIP ARTHROPLASTY;  Surgeon: Gean Birchwood, MD;  Location: [facility] OR;  Service: Orthopedics;  Laterality: Right;  . TOTAL KNEE ARTHROPLASTY  10/21/2011   Procedure: TOTAL KNEE ARTHROPLASTY;  Surgeon: Nestor Lewandowsky, MD;  Location: [facility] OR;  Service: Orthopedics;  Laterality: Right;  DEPUY SIGMA RP  . TUBAL LIGATION      Allergies as of 09/28/2019      Reactions   Codeine Nausea Only, Other (See Comments)   headaches   Sulfa Antibiotics Nausea Only   Upset stomach, headaches   Sulfa Drugs Cross Reactors Other (See Comments)   Its been a long time ago      Medication List       Accurate as of September 28, 2019 11:59  PM. If you have any questions, ask your nurse or doctor.        STOP taking these medications   methocarbamol 500 MG tablet Commonly known as: ROBAXIN   potassium chloride 20 MEQ packet Commonly known as: KLOR-CON     TAKE these medications   acetaminophen 325 MG tablet Commonly known as: TYLENOL Take 2 tablets (650 mg total) by mouth every 6 (six) hours as needed for mild pain (or Fever >/= 101).   amLODipine 2.5 MG tablet Commonly known as: NORVASC Take 2.5 mg by mouth daily.   aspirin EC 325 MG tablet Take 1 tablet (325 mg total) by mouth 2 (two) times daily.   camphor-menthol lotion Commonly known as: SARNA Apply 1 application topically 3 (three) times daily. Discontinue when itching resolved   cetirizine 10 MG tablet Commonly known as: ZYRTEC Take 10 mg by mouth daily.   citalopram 40 MG tablet Commonly known as: CELEXA Take 40 mg by  mouth daily.   clonazePAM 0.5 MG tablet Commonly known as: KLONOPIN Take 0.5 mg by mouth 2 (two) times daily.   clonazePAM 0.5 MG tablet Commonly known as: KLONOPIN Take 1 tablet (0.5 mg total) by mouth 2 (two) times daily.   cloNIDine 0.2 MG tablet Commonly known as: CATAPRES Take 1 tablet (0.2 mg total) by mouth 2 (two) times daily.   clopidogrel 75 MG tablet Commonly known as: PLAVIX Take 1 tablet (75 mg total) by mouth daily.   diphenhydrAMINE 25 MG tablet Commonly known as: BENADRYL Take 25 mg by mouth every 6 (six) hours as needed for itching.   doxazosin 8 MG tablet Commonly known as: CARDURA Take 8 mg by mouth at bedtime.   FLAX SEED OIL PO Take 1,200 mg by mouth daily.   gabapentin 300 MG capsule Commonly known as: NEURONTIN Take 300 mg by mouth 3 (three) times daily.   hydrOXYzine 25 MG tablet Commonly known as: ATARAX/VISTARIL Take 25 mg by mouth every 6 (six) hours. x6 days for itching/skin rash   ipratropium-albuterol 0.5-2.5 (3) MG/3ML Soln Commonly known as: DUONEB Take 3 mLs by nebulization every 6 (six) hours.   losartan 100 MG tablet Commonly known as: COZAAR Take 100 mg by mouth daily.   metoprolol tartrate 25 MG tablet Commonly known as: LOPRESSOR Take 0.5 tablets (12.5 mg total) by mouth 2 (two) times daily.   montelukast 10 MG tablet Commonly known as: SINGULAIR Take 10 mg by mouth at bedtime.   neomycin-bacitracin-polymyxin [id number] ointment Apply 1 application topically every 6 (six) hours as needed (Itching).   omeprazole 20 MG capsule Commonly known as: PRILOSEC Take 40 mg by mouth daily.   potassium chloride SA 20 MEQ tablet Commonly known as: KLOR-CON Take 20 mEq by mouth 2 (two) times daily.   QUEtiapine 50 MG tablet Commonly known as: SEROQUEL Take 1 tablet (50 mg total) by mouth at bedtime.   rosuvastatin 20 MG tablet Commonly known as: CRESTOR Take 1 tablet (20 mg total) by mouth daily at 6 PM.   triamcinolone  cream 0.1 % Commonly known as: KENALOG Apply 1 application topically daily as needed (Itching).   VITAMIN B 12 PO Take 1,000 mg by mouth daily.   vitamin C 500 MG tablet Commonly known as: ASCORBIC ACID Take 500 mg by mouth daily.   Vitamin D 50 MCG (2000 UT) tablet Take 2,000 Units by mouth daily.       No orders of the defined types were placed in this encounter.  Immunization History  Administered Date(s) Administered  . Influenza-Unspecified 05/31/2014, 07/01/2019  . PPD Test 03/01/2015    Social History   Tobacco Use  . Smoking status: Never Smoker  . Smokeless tobacco: Never Used  Substance Use Topics  . Alcohol use: Yes    Comment: 2x's /month, socially      Review of Systems   GENERAL:  no fevers, fatigue, appetite changes SKIN: Rash and itching  HEENT: No complaint RESPIRATORY: No cough, wheezing, SOB CARDIAC: No chest pain, palpitations, lower extremity edema  GI: No abdominal pain, No N/V/D or constipation, No heartburn or reflux  GU: No dysuria, frequency or urgency, or incontinence  MUSCULOSKELETAL: No unrelieved bone/joint pain NEUROLOGIC: No headache, dizziness  PSYCHIATRIC: No overt anxiety or sadness  Vitals:   09/29/19 1420  BP: 129/80  Pulse: 95  Resp: 18  Temp: (!) 97.5 F (36.4 C)  SpO2: 94%   Body mass index is 40.81 kg/m. Physical Exam  GENERAL APPEARANCE: Alert, conversant,  SKIN: Macular and papular rash most prominent on proximal lower extremities trunk under arms shoulders, red and pink HEENT: Unremarkable RESPIRATORY: Breathing is even, unlabored. Lung sounds are slight wheeze, good airflow CARDIOVASCULAR: Heart RRR no murmurs, rubs or gallops. No peripheral edema  GASTROINTESTINAL: Abdomen is soft, non-tender, not distended w/ normal bowel sounds.  GENITOURINARY: Bladder non tender, not distended  MUSCULOSKELETAL: No abnormal joints or musculature NEUROLOGIC: Cranial nerves 2-12 grossly intact.  Right-sided  weakness PSYCHIATRIC: Emotional lability since stroke, no behavioral issues  Patient Active Problem List   Diagnosis Date Noted  . Hyperlipidemia 09/26/2019  . Hypomagnesemia 09/17/2019  . 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 Depression 09/15/2019  . 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 Mild cognitive impairment 03/11/2017  . Syncopal episodes 05/16/2015  . Acute renal injury (HCC) 05/16/2015  . Elevated troponin 05/16/2015  . Hypertension 05/16/2015  . Non compliance w medication regimen 05/16/2015  . Pulmonary nodule 05/16/2015  . Pressure ulcer 02/28/2015  . Dehydration 02/26/2015  . Intractable vomiting with nausea 02/26/2015  . Hypokalemia 02/25/2015  . Abdominal pain   . Primary osteoarthritis of right hip 02/15/2015  . Severe recurrent major depressive disorder with psychotic features (HCC) 09/01/2014  . GAD (generalized anxiety disorder) 09/01/2014  . PTSD (post-traumatic stress disorder) 09/01/2014  . Hip arthritis left 12/23/2011  . 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 10/21/2011    CMP     Component Value Date/Time   NA 144 09/20/2019 0000   K 3.6 09/20/2019 0000   CL 104 09/20/2019 0000   CO2 27 (A) 09/20/2019 0000   GLUCOSE 100 (H) 09/17/2019 0254   BUN 13 09/20/2019 0000   CREATININE 0.7 09/20/2019 0000   CREATININE 0.79 09/17/2019 0254   CALCIUM 8.4 (A) 09/20/2019 0000   PROT 6.8 09/14/2019 1126   ALBUMIN 3.5 09/14/2019 1126   AST 17 09/14/2019 1126   ALT 15 09/14/2019 1126   ALKPHOS 95 09/14/2019 1126   BILITOT 1.2 09/14/2019 1126   GFRNONAA 89.41 09/20/2019 0000   GFRAA 90 09/20/2019 0000   Recent Labs    09/15/19 0636 09/16/19 0249 09/17/19 0254 09/20/19 0000  NA 146* 145 143 144  K 4.0 3.3* 3.5 3.6  CL 107 106 106 104  CO2 27*  GLUCOSE 96 113* 100*  --   BUN CREATININE 0.91 0.86 0.79 0.7  CALCIUM 8.8* 8.8* 8.7* 8.4*  MG 1.6*  --   --   --  Recent Labs    09/14/19 1126  AST 17  ALT 15  ALKPHOS 95   BILITOT 1.2  PROT 6.8  ALBUMIN 3.5   Recent Labs    09/30/18 2124 09/14/19 1126 09/20/19 0000  WBC 3.9* 3.9* 5.2  NEUTROABS  --  2.2 3  HGB 12.8 12.6 12.1  HCT 35.4* 35.5* 34*  MCV 81.9 82.6  --   PLT 261 266 235   Recent Labs    09/15/19 0636  CHOL 175  LDLCALC 114*  TRIG 42   No results found for: [facility] Lab Results  Component Value Date   TSH 0.174 (L) 05/17/2015   Lab Results  Component Value Date   HGBA1C 5.2 09/15/2019   Lab Results  Component Value Date   CHOL 175 09/15/2019   HDL 53 09/15/2019   LDLCALC 114 (H) 09/15/2019   TRIG 42 09/15/2019   CHOLHDL 3.3 09/15/2019    Significant Diagnostic Results in last 30 days:  CT ANGIO HEAD W OR WO CONTRAST  Result Date: 09/15/2019 CLINICAL DATA:  Follow-up examination for acute stroke. EXAM: CT ANGIOGRAPHY HEAD AND NECK TECHNIQUE: Multidetector CT imaging of the head and neck was performed using the standard protocol during bolus administration of intravenous contrast. Multiplanar CT image reconstructions and MIPs were obtained to evaluate the vascular anatomy. Carotid stenosis measurements (when applicable) are obtained utilizing NASCET criteria, using the distal internal carotid diameter as the denominator. CONTRAST:  80mL OMNIPAQUE IOHEXOL 350 MG/ML SOLN COMPARISON:  Prior MRI from earlier the same day. FINDINGS: CTA NECK FINDINGS Aortic arch: Visualized aortic arch of normal caliber with normal branch pattern. No hemodynamically significant stenosis seen about the origin of the great vessels. Visualized subclavian arteries widely patent. Right carotid system: Right common and internal carotid arteries are diffusely tortuous but widely patent to the skull base without stenosis, dissection or occlusion. Right carotid artery system medialized into the retropharyngeal space. Left carotid system: Left common and internal carotid arteries are diffusely tortuous but widely patent to the skull base without stenosis,  dissection or occlusion. Left carotid artery system medialized into the retropharyngeal space. Vertebral arteries: Both vertebral arteries arise from the subclavian arteries. Vertebral arteries mildly tortuous but widely patent within the neck without stenosis, dissection or occlusion. Skeleton: No acute osseous finding. No discrete lytic or blastic osseous lesions. Other neck: No other acute soft tissue abnormality within the neck. Enlarged multinodular right lobe of thyroid is seen. Dominant 19 mm hypodense right thyroid not (series 6, image 116). Left thyroid lobe hypoplastic and/or absent. Upper chest: Visualized upper chest demonstrates no acute finding. Partially visualized lungs are largely clear. Review of the MIP images confirms the above findings CTA HEAD FINDINGS Anterior circulation: Both petrous segments widely patent. Mild scattered atheromatous plaque within the cavernous/supraclinoid ICAs without significant stenosis. A1 segments widely patent. Normal anterior communicating artery. Anterior cerebral arteries widely patent to their distal aspects without stenosis. M1 segments widely patent bilaterally. Normal MCA bifurcations. Distal MCA branches well perfused and symmetric. Posterior circulation: Vertebral arteries mildly irregular but patent to the vertebrobasilar junction without flow-limiting stenosis. Patent right PICA. Left PICA not seen. Basilar widely patent to its distal aspect. Basilar tip somewhat ectatic without discrete aneurysm. Superior cerebral arteries patent bilaterally. Right PCA supplied via the basilar as well as a small right posterior communicating artery. Fetal type origin of the left PCA. PCAs mildly irregular but are well perfused to their distal aspects without significant stenosis. Venous sinuses: Grossly patent allowing for timing the  contrast bolus. Anatomic variants: Fetal type origin left PCA. No intracranial aneurysm or other vascular abnormality. Review of the MIP  images confirms the above findings IMPRESSION: 1. Negative CTA for large vessel occlusion. No high-grade or correctable stenosis identified. 2. Diffuse tortuosity of the major arterial vasculature of the head and neck, suggesting chronic underlying hypertension. 3. Enlarged multinodular right lobe of thyroid with dominant 19 mm right thyroid nodule. Further evaluation with dedicated thyroid ultrasound recommended. This could be performed on a nonemergent basis.(ref: J Am Coll Radiol. 2015 Feb;12(2): 143-50). Electronically Signed   By: Jeannine Boga M.D.   On: 09/15/2019 04:06   CT HEAD WO CONTRAST  Result Date: 09/14/2019 CLINICAL DATA:  Slurred speech, right-sided weakness. EXAM: CT HEAD WITHOUT CONTRAST TECHNIQUE: Contiguous axial images were obtained from the base of the skull through the vertex without intravenous contrast. COMPARISON:  Feb 26, 2019. FINDINGS: Brain: Mild chronic ischemic white matter disease is noted. No mass effect or midline shift is noted. Ventricular size is within normal limits. There is no evidence of mass lesion, hemorrhage or acute infarction. Vascular: No hyperdense vessel or unexpected calcification. Skull: Normal. Negative for fracture or focal lesion. Sinuses/Orbits: No acute finding. Other: None. IMPRESSION: Mild chronic ischemic white matter disease. No acute intracranial abnormality seen. Electronically Signed   By: Marijo Conception M.D.   On: 09/14/2019 11:49   CT ANGIO NECK W OR WO CONTRAST  Result Date: 09/15/2019 CLINICAL DATA:  Follow-up examination for acute stroke. EXAM: CT ANGIOGRAPHY HEAD AND NECK TECHNIQUE: Multidetector CT imaging of the head and neck was performed using the standard protocol during bolus administration of intravenous contrast. Multiplanar CT image reconstructions and MIPs were obtained to evaluate the vascular anatomy. Carotid stenosis measurements (when applicable) are obtained utilizing NASCET criteria, using the distal internal  carotid diameter as the denominator. CONTRAST:  75mL OMNIPAQUE IOHEXOL 350 MG/ML SOLN COMPARISON:  Prior MRI from earlier the same day. FINDINGS: CTA NECK FINDINGS Aortic arch: Visualized aortic arch of normal caliber with normal branch pattern. No hemodynamically significant stenosis seen about the origin of the great vessels. Visualized subclavian arteries widely patent. Right carotid system: Right common and internal carotid arteries are diffusely tortuous but widely patent to the skull base without stenosis, dissection or occlusion. Right carotid artery system medialized into the retropharyngeal space. Left carotid system: Left common and internal carotid arteries are diffusely tortuous but widely patent to the skull base without stenosis, dissection or occlusion. Left carotid artery system medialized into the retropharyngeal space. Vertebral arteries: Both vertebral arteries arise from the subclavian arteries. Vertebral arteries mildly tortuous but widely patent within the neck without stenosis, dissection or occlusion. Skeleton: No acute osseous finding. No discrete lytic or blastic osseous lesions. Other neck: No other acute soft tissue abnormality within the neck. Enlarged multinodular right lobe of thyroid is seen. Dominant 19 mm hypodense right thyroid not (series 6, image 116). Left thyroid lobe hypoplastic and/or absent. Upper chest: Visualized upper chest demonstrates no acute finding. Partially visualized lungs are largely clear. Review of the MIP images confirms the above findings CTA HEAD FINDINGS Anterior circulation: Both petrous segments widely patent. Mild scattered atheromatous plaque within the cavernous/supraclinoid ICAs without significant stenosis. A1 segments widely patent. Normal anterior communicating artery. Anterior cerebral arteries widely patent to their distal aspects without stenosis. M1 segments widely patent bilaterally. Normal MCA bifurcations. Distal MCA branches well perfused  and symmetric. Posterior circulation: Vertebral arteries mildly irregular but patent to the vertebrobasilar junction without flow-limiting stenosis.  Patent right PICA. Left PICA not seen. Basilar widely patent to its distal aspect. Basilar tip somewhat ectatic without discrete aneurysm. Superior cerebral arteries patent bilaterally. Right PCA supplied via the basilar as well as a small right posterior communicating artery. Fetal type origin of the left PCA. PCAs mildly irregular but are well perfused to their distal aspects without significant stenosis. Venous sinuses: Grossly patent allowing for timing the contrast bolus. Anatomic variants: Fetal type origin left PCA. No intracranial aneurysm or other vascular abnormality. Review of the MIP images confirms the above findings IMPRESSION: 1. Negative CTA for large vessel occlusion. No high-grade or correctable stenosis identified. 2. Diffuse tortuosity of the major arterial vasculature of the head and neck, suggesting chronic underlying hypertension. 3. Enlarged multinodular right lobe of thyroid with dominant 19 mm right thyroid nodule. Further evaluation with dedicated thyroid ultrasound recommended. This could be performed on a nonemergent basis.(ref: J Am Coll Radiol. 2015 Feb;12(2): 143-50). Electronically Signed   By: Rise Mu M.D.   On: 09/15/2019 04:06   MR BRAIN WO CONTRAST  Result Date: 09/15/2019 CLINICAL DATA:  Initial evaluation for acute weakness. EXAM: MRI HEAD WITHOUT CONTRAST TECHNIQUE: Multiplanar, multiecho pulse sequences of the brain and surrounding structures were obtained without intravenous contrast. COMPARISON:  Prior CT from 09/14/2019. FINDINGS: Brain: Generalized age-related cerebral atrophy. Patchy and confluent T2/FLAIR hyperintensity within the periventricular deep white matter both cerebral hemispheres most consistent with chronic small vessel ischemic disease, severe in nature. Multiple scatter remote lacunar infarcts  seen involving the bilateral basal ganglia, thalami, and periventricular white matter of the corona radiata. Associated chronic hemosiderin staining seen about several of these infarcts. Few scattered remote subcentimeter bilateral cerebellar infarcts noted as well. There is a 11 mm focus of restricted diffusion involving the posterior left basal ganglia, consistent with an acute ischemic perforator type infarct (series 5, image 73). No associated hemorrhage or mass effect. No other evidence for acute or subacute ischemia. No acute intracranial hemorrhage elsewhere within the brain. Multiple scattered chronic micro hemorrhages seen clustered about the, consistent with chronic poorly controlled hypertension. No mass lesion, midline shift or mass effect. No hydrocephalus. No extra-axial fluid collection. Pituitary gland and suprasellar region normal. Midline structures intact. Brainstem, and cerebellum Vascular: Major intracranial vascular flow voids are maintained. Skull and upper cervical spine: Craniocervical junction within normal limits. Bone marrow signal intensity normal. No scalp soft tissue abnormality. Sinuses/Orbits: Globes and orbital soft tissues demonstrate no acute finding. Paranasal sinuses are largely clear. Trace right mastoid effusion, of doubtful significance. Inner ear structures normal. Other: None. IMPRESSION: 1. 11 mm acute ischemic nonhemorrhagic left basal ganglia infarct. 2. Underlying age-related cerebral atrophy with severe chronic microvascular ischemic disease, with multiple remote lacunar infarcts as above. 3. Multiple scattered chronic micro hemorrhages clustered about the deep gray nuclei, brainstem, and cerebellum, consistent with chronic poorly controlled hypertension. Electronically Signed   By: Rise Mu M.D.   On: 09/15/2019 01:31   DG Chest Portable 1 View  Result Date: 09/15/2019 CLINICAL DATA:  CVA. Slurred speech. Right arm and leg weakness. EXAM: PORTABLE  CHEST 1 VIEW COMPARISON:  Radiograph of 10/01/2018 FINDINGS: Borderline cardiomegaly stable from prior. Unchanged mediastinal contours with aortic tortuosity. Minor bibasilar atelectasis. No pulmonary edema, pleural fluid or pneumothorax. No acute osseous abnormalities are seen. IMPRESSION: 1. Borderline cardiomegaly, stable from prior. 2. Minor bibasilar atelectasis. Electronically Signed   By: Narda Rutherford M.D.   On: 09/15/2019 00:53   ECHOCARDIOGRAM COMPLETE  Result Date: 09/15/2019   ECHOCARDIOGRAM  REPORT   Patient Name:   SHANIAH BALTES Date of Exam: 09/15/2019 Medical Rec #:  [id number]        Height:       61.0 in Accession #:    [id number]       Weight:       200.0 lb Date of Birth:  01-16-1956        BSA:          1.89 m Patient Age:    63 years         BP:           116/79 mmHg Patient Gender: F                HR:           78 bpm. Exam Location:  Inpatient Procedure: 2D Echo Indications:    Stroke 434.91 / I163.9  History:        Patient has no prior history of Echocardiogram examinations.                 Stroke; Risk Factors:Hypertension and Non-Smoker. Elevated                 Troponin, PTSD, Acute renal injury.  Sonographer:    Jeryl Columbia Referring Phys: [phone] KAREN M BLACK IMPRESSIONS  1. Left ventricular ejection fraction, by visual estimation, is 65 to 70%. The left ventricle has normal function. There is mildly increased left ventricular hypertrophy.  2. Small left ventricular internal cavity size.  3. The left ventricle has no regional wall motion abnormalities.  4. Global right ventricle has normal systolic function.The right ventricular size is normal. No increase in right ventricular wall thickness.  5. Left atrial size was normal.  6. Right atrial size was normal.  7. Presence of pericardial fat pad.  8. Trivial pericardial effusion is present.  9. The mitral valve is grossly normal. No evidence of mitral valve regurgitation. 10. The tricuspid valve is grossly normal. Tricuspid  valve regurgitation is trivial. 11. The aortic valve was not well visualized. Aortic valve regurgitation is not visualized. No evidence of aortic valve sclerosis or stenosis. 12. The pulmonic valve was not well visualized. Pulmonic valve regurgitation is not visualized. 13. TR signal is inadequate for assessing pulmonary artery systolic pressure. 14. The inferior vena cava is normal in size with greater than 50% respiratory variability, suggesting right atrial pressure of 3 mmHg. 15. No prior Echocardiogram. FINDINGS  Left Ventricle: Left ventricular ejection fraction, by visual estimation, is 65 to 70%. The left ventricle has normal function. The left ventricle has no regional wall motion abnormalities. The left ventricular internal cavity size was the LV cavity size is small. There is mildly increased left ventricular hypertrophy. Concentric left ventricular hypertrophy. Left ventricular diastolic parameters are consistent with age-related delayed relaxation (normal). Normal left atrial pressure. Right Ventricle: The right ventricular size is normal. No increase in right ventricular wall thickness. Global RV systolic function is has normal systolic function. Left Atrium: Left atrial size was normal in size. Right Atrium: Right atrial size was normal in size Pericardium: Trivial pericardial effusion is present. Presence of pericardial fat pad. Mitral Valve: The mitral valve is grossly normal. No evidence of mitral valve regurgitation. Tricuspid Valve: The tricuspid valve is grossly normal. Tricuspid valve regurgitation is trivial. Aortic Valve: The aortic valve was not well visualized. Aortic valve regurgitation is not visualized. The aortic valve is structurally normal, with no evidence of sclerosis or stenosis. Pulmonic  Valve: The pulmonic valve was not well visualized. Pulmonic valve regurgitation is not visualized. Pulmonic regurgitation is not visualized. Aorta: The aortic root is normal in size and structure.  Venous: The inferior vena cava is normal in size with greater than 50% respiratory variability, suggesting right atrial pressure of 3 mmHg. IAS/Shunts: No atrial level shunt detected by color flow Doppler.  LEFT VENTRICLE PLAX 2D LVIDd:         3.20 cm  Diastology LVIDs:         2.10 cm  LV e' lateral:   8.27 cm/s LV PW:         1.40 cm  LV E/e' lateral: 6.5 LV IVS:        1.50 cm  LV e' medial:    6.96 cm/s LVOT diam:     1.90 cm  LV E/e' medial:  7.8 LV SV:         27 ml LV SV Index:   13.13 LVOT Area:     2.84 cm  RIGHT VENTRICLE TAPSE (M-mode): 2.4 cm LEFT ATRIUM           Index       RIGHT ATRIUM           Index LA diam:      4.10 cm 2.17 cm/m  RA Area:     14.90 cm LA Vol (A2C): 33.3 ml 17.63 ml/m RA Volume:   35.60 ml  18.85 ml/m LA Vol (A4C): 39.2 ml 20.75 ml/m   AORTA Ao Root diam: 2.90 cm MITRAL VALVE MV Area (PHT): 4.12 cm             SHUNTS MV PHT:        53.46 msec           Systemic Diam: 1.90 cm MV Decel Time: 184 msec MV E velocity: 54.15 cm/s 103 cm/s MV A velocity: 61.60 cm/s 70.3 cm/s MV E/A ratio:  0.88       1.5  Lennie OdorWesley O'Neal MD Electronically signed by Lennie OdorWesley O'Neal MD Signature Date/Time: 09/15/2019/10:39:30 AM    Final     Assessment and Plan  Urticaria/wheezing-last known exposures Robaxin so have DC'd Robaxin.  Have started hydroxyzine 25 mg every 6 for 7 days, patient has been given hydroxyzine per phone order 3 days ago Zyrtec 10 mg daily for 10 days, Neosporin with pain relief to be used as needed as often as needed, increase Ativan to 0.5 mg 3 times daily from twice daily and schedule albuterol nebs every 6 hours round-the-clock for 7 days.  Will monitor response   ;> 50% of time with patient was spent reviewing records, labs, tests and studies, counseling and developing plan of care  Margit HanksAnne D Markas Aldredge, MD

## 2019-10-01 ENCOUNTER — Encounter: Payer: Self-pay | Admitting: Internal Medicine

## 2019-10-01 DIAGNOSIS — R062 Wheezing: Secondary | ICD-10-CM | POA: Insufficient documentation

## 2019-10-01 DIAGNOSIS — L509 Urticaria, unspecified: Secondary | ICD-10-CM | POA: Insufficient documentation

## 2019-10-08 ENCOUNTER — Non-Acute Institutional Stay (SKILLED_NURSING_FACILITY): Payer: Medicare Other | Admitting: Internal Medicine

## 2019-10-08 DIAGNOSIS — I10 Essential (primary) hypertension: Secondary | ICD-10-CM

## 2019-10-13 ENCOUNTER — Non-Acute Institutional Stay (SKILLED_NURSING_FACILITY): Payer: Medicare Other | Admitting: Internal Medicine

## 2019-10-13 ENCOUNTER — Encounter: Payer: Self-pay | Admitting: Internal Medicine

## 2019-10-13 DIAGNOSIS — I10 Essential (primary) hypertension: Secondary | ICD-10-CM | POA: Diagnosis not present

## 2019-10-13 DIAGNOSIS — I6381 Other cerebral infarction due to occlusion or stenosis of small artery: Secondary | ICD-10-CM

## 2019-10-13 DIAGNOSIS — L509 Urticaria, unspecified: Secondary | ICD-10-CM | POA: Diagnosis not present

## 2019-10-13 DIAGNOSIS — F411 Generalized anxiety disorder: Secondary | ICD-10-CM

## 2019-10-13 DIAGNOSIS — E876 Hypokalemia: Secondary | ICD-10-CM

## 2019-10-13 DIAGNOSIS — I639 Cerebral infarction, unspecified: Secondary | ICD-10-CM

## 2019-10-13 NOTE — Progress Notes (Signed)
Location:    [name] Room Number: 102/P Place of Service:  SNF (31) Provider:  Elizebeth Koller, MD  Patient Care Team: Georgann Housekeeper, MD as PCP - General (Internal Medicine) Georgann Housekeeper, MD (Internal Medicine)  Extended Emergency Contact Information Primary Emergency Contact: Green,Margie Address: [address]          [place], [place] [address] [place] of [place] Home Phone: [phone] Mobile Phone: [phone] Relation: Mother Secondary Emergency Contact: Evern Bio [place] of [place] Home Phone: [phone] Relation: Sister  Code Status:  Full Code Goals of care: Advanced Directive information Advanced Directives 10/13/2019  Does Patient Have a Medical Advance Directive? Yes  Type of Advance Directive (No Data)  Does patient want to make changes to medical advance directive? No - Patient declined  Copy of Healthcare Power of Attorney in Chart? -  Would patient like information on creating a medical advance directive? -  Pre-existing out of facility DNR order (yellow form or pink MOST form) -     Chief Complaint  Patient presents with  . Medical Management of Chronic Issues    Routine visit of mediacl magement   Medical management of chronic medical conditions including history of CVA with right-sided weakness-hypertension-anxiety-hyperlipidemia-as well as recent history of increased itching.    HPI:  Pt is a 64 y.o. female seen today for medical management of chronic diseases.  As noted above.  Most recent acute issue was a rash and itching more so on her left side-her Robaxin was discontinued and she was treated with hydroxyzine Zyrtec and Neosporin as well as nebulizers this appears to have largely resolved.  She says it is significantly improved.  She is also seen recently for elevated blood pressure her Norvasc was increased to 10 mg a day and this appears to be doing a bit better as  well recent systolics in the 130s she is on Norvasc 10 mg a day in addition to clonidine 0.2 mg twice daily Cozaar 100 mg a day and Cardura 8 mg nightly.  She was admitted to skilled nursing after hospitalization for an acute CVA-she was found to have a nonhemorrhagic left basal ganglia infarct with right-sided deficits and was started on aspirin and Plavix she is working with therapy.  Currently she is lying in bed comfortably does not really have any complaints she says the rash and itching has improved significantly nursing does not report any issues   Past Medical History:  Diagnosis Date  . Anxiety    "Breakdown" in the 1990's, hosp. for >1 wk. at [facility]    . Anxiety   . Arthritis    OA- R knee, L hip   . Asthma    years ago. "no problems in a long while."  . Constipation   . Depression   . GERD (gastroesophageal reflux disease)   . Goiter    removed  at age 18  . Hypertension    all BP meds. managed by Dr. Eula Listen    . Hypertension   . Incontinence of urine    weak bladder  . Pneumonia    while at [facility].- 1990's told that she had pneumonia & was isolated   . Stroke (HCC)    2005, R sided weakness   . Stroke [facility]) 2005   ? weakness in hand and arms per mother. patient unsure   Past Surgical History:  Procedure Laterality Date  . ABDOMINAL HYSTERECTOMY    .  COLONOSCOPY    . DILATION AND CURETTAGE OF UTERUS     1980's  . goiter     removed age 1  . JOINT REPLACEMENT  10/2011   Rt knee  . JOINT REPLACEMENT Right    knee  . THYROIDECTOMY, PARTIAL    . TONSILLECTOMY    . TOTAL HIP ARTHROPLASTY  12/23/2011   Procedure: TOTAL HIP ARTHROPLASTY;  Surgeon: Nestor Lewandowsky, MD;  Location: [facility] OR;  Service: Orthopedics;  Laterality: Left;  DEPUY/PINNACLE CUPS/SROM STEM  . TOTAL HIP ARTHROPLASTY Left   . TOTAL HIP ARTHROPLASTY Right 02/15/2015   Procedure: TOTAL HIP ARTHROPLASTY;  Surgeon: Gean Birchwood, MD;  Location: [facility] OR;  Service: Orthopedics;  Laterality: Right;  .  TOTAL KNEE ARTHROPLASTY  10/21/2011   Procedure: TOTAL KNEE ARTHROPLASTY;  Surgeon: Nestor Lewandowsky, MD;  Location: [facility] OR;  Service: Orthopedics;  Laterality: Right;  DEPUY SIGMA RP  . TUBAL LIGATION      Allergies  Allergen Reactions  . Codeine Nausea Only and Other (See Comments)    headaches  . Sulfa Antibiotics Nausea Only    Upset stomach, headaches  . Sulfa Drugs Cross Reactors Other (See Comments)    Its been a long time ago    Allergies as of 10/13/2019      Reactions   Codeine Nausea Only, Other (See Comments)   headaches   Sulfa Antibiotics Nausea Only   Upset stomach, headaches   Sulfa Drugs Cross Reactors Other (See Comments)   Its been a long time ago      Medication List       Accurate as of October 13, 2019 10:23 AM. If you have any questions, ask your nurse or doctor.        STOP taking these medications   camphor-menthol lotion Commonly known as: SARNA   hydrOXYzine 25 MG tablet Commonly known as: ATARAX/VISTARIL     TAKE these medications   acetaminophen 325 MG tablet Commonly known as: TYLENOL Take 2 tablets (650 mg total) by mouth every 6 (six) hours as needed for mild pain (or Fever >/= 101).   amLODipine 10 MG tablet Commonly known as: NORVASC Take 10 mg by mouth daily. For HTN What changed: Another medication with the same name was removed. Continue taking this medication, and follow the directions you see here.   aspirin EC 325 MG tablet Take 1 tablet (325 mg total) by mouth 2 (two) times daily.   cetirizine 10 MG tablet Commonly known as: ZYRTEC Take 10 mg by mouth daily.   citalopram 40 MG tablet Commonly known as: CELEXA Take 40 mg by mouth daily.   clonazePAM 0.5 MG tablet Commonly known as: KLONOPIN Take 0.5 mg by mouth 3 (three) times daily. For Anxiety What changed: Another medication with the same name was removed. Continue taking this medication, and follow the directions you see here.   cloNIDine 0.2 MG tablet Commonly  known as: CATAPRES Take 1 tablet (0.2 mg total) by mouth 2 (two) times daily.   clopidogrel 75 MG tablet Commonly known as: PLAVIX Take 1 tablet (75 mg total) by mouth daily.   diphenhydrAMINE 25 MG tablet Commonly known as: BENADRYL Take 25 mg by mouth every 6 (six) hours as needed for itching.   doxazosin 8 MG tablet Commonly known as: CARDURA Take 8 mg by mouth at bedtime.   FLAX SEED OIL PO Take 1,000 mg by mouth daily.   gabapentin 300 MG capsule Commonly known as: NEURONTIN Take  300 mg by mouth 3 (three) times daily.   ipratropium-albuterol 0.5-2.5 (3) MG/3ML Soln Commonly known as: DUONEB Take 3 mLs by nebulization every 6 (six) hours.   losartan 100 MG tablet Commonly known as: COZAAR Take 100 mg by mouth daily.   metoprolol tartrate 25 MG tablet Commonly known as: LOPRESSOR Take 0.5 tablets (12.5 mg total) by mouth 2 (two) times daily.   montelukast 10 MG tablet Commonly known as: SINGULAIR Take 10 mg by mouth at bedtime.   neomycin-bacitracin-polymyxin [phone] ointment Apply 1 application topically every 6 (six) hours as needed (Itching).   omeprazole 20 MG capsule Commonly known as: PRILOSEC Take 40 mg by mouth daily.   potassium chloride SA 20 MEQ tablet Commonly known as: KLOR-CON Take 20 mEq by mouth 2 (two) times daily.   QUEtiapine 50 MG tablet Commonly known as: SEROQUEL Take 1 tablet (50 mg total) by mouth at bedtime.   rosuvastatin 20 MG tablet Commonly known as: CRESTOR Take 1 tablet (20 mg total) by mouth daily at 6 PM.   triamcinolone cream 0.1 % Commonly known as: KENALOG Apply 1 application topically daily as needed (Itching).   VITAMIN B 12 PO Take 1,000 mg by mouth daily.   vitamin C 500 MG tablet Commonly known as: ASCORBIC ACID Take 500 mg by mouth daily.   Vitamin D 50 MCG (2000 UT) tablet Take 2,000 Units by mouth daily.       Review of Systems   In general no complaints of fever or chills.  Skin does not  complain of itching today rash has improved.  Head ears eyes nose mouth and throat is not complain of visual changes or sore throat.  Respiratory does not complain of shortness of breath cough or increased wheezing.  Cardiac is not complaining of chest pain or increased edema from baseline.  GI is not complaining of abdominal discomfort nausea vomiting diarrhea or constipation.  Musculoskeletal does have right-sided weakness does not complain of pain currently.  Neurologic positive for CVA with right-sided weakness does not complain of dizziness headache numbness.  Psych does have a history of depression with some apparent psychosis continues on Seroquel 50 mg nightly as well as clonazepam 0.5 mg 3 times daily and Celexa 40 mg a day-nursing is not really noted any consistent behaviors..  Patient appears to be in good spirits    Immunization History  Administered Date(s) Administered  . Influenza-Unspecified 05/31/2014, 07/01/2019  . PPD Test 03/01/2015   Pertinent  Health Maintenance Due  Topic Date Due  . PAP SMEAR-Modifier  04/28/1975  . COLONOSCOPY  04/27/2004  . DEXA SCAN  04/28/2019  . PNA vac Low Risk Adult (1 of 2 - PCV13) 04/28/2019  . MAMMOGRAM  05/26/2021  . INFLUENZA VACCINE  Completed   Fall Risk  03/11/2017  Falls in the past year? Yes  Number falls in past yr: 1  Injury with Fall? No   Functional Status Survey:    Vitals:   10/13/19 0940  BP: 135/88  Pulse: 77  Resp: 18  Temp: (!) 97.3 F (36.3 C)  TempSrc: Oral  SpO2: 99%  Weight: 217 lb 12.8 oz (98.8 kg)  Height: 5\' 1"  (1.549 m)   Body mass index is 41.15 kg/m. Physical Exam   In general this is a pleasant female in no distress lying comfortably in bed.  Her skin is warm and dry the rash on her left arm appears to be improved.  Eyes visual acuity appears to be intact  sclera and conjunctive are clear.  Oropharynx is clear mucous membranes moist.  Chest is clear to auscultation there is  no labored breathing.  Heart is regular rate and rhythm without murmur gallop or rub she has some baseline right sided edema this is the side affected by her CVA.  Abdomen is soft obese nontender with positive bowel sounds.  Musculoskeletal significant for continued right-sided weakness-moves her left extremities it appears at baseline limited exam since she is in bed.  Neurologic as noted above positive for right-sided weakness she has somewhat slurred speech but is understandable.  Psych she is pleasant and appropriate  Labs reviewed: Recent Labs    09/15/19 0636 09/16/19 0249 09/17/19 0254 09/20/19 0000  NA 146* 145 143 144  K 4.0 3.3* 3.5 3.6  CL 107 106 106 104  CO2 30 28 29  27*  GLUCOSE 96 113* 100*  --   BUN 13 12 10 13   CREATININE 0.91 0.86 0.79 0.7  CALCIUM 8.8* 8.8* 8.7* 8.4*  MG 1.6*  --   --   --    Recent Labs    09/14/19 1126  AST 17  ALT 15  ALKPHOS 95  BILITOT 1.2  PROT 6.8  ALBUMIN 3.5   Recent Labs    09/14/19 1126 09/20/19 0000  WBC 3.9* 5.2  NEUTROABS 2.2 3  HGB 12.6 12.1  HCT 35.5* 34*  MCV 82.6  --   PLT 266 235   Lab Results  Component Value Date   TSH 0.174 (L) 05/17/2015   Lab Results  Component Value Date   HGBA1C 5.2 09/15/2019   Lab Results  Component Value Date   CHOL 175 09/15/2019   HDL 53 09/15/2019   LDLCALC 114 (H) 09/15/2019   TRIG 42 09/15/2019   CHOLHDL 3.3 09/15/2019    Significant Diagnostic Results in last 30 days:  CT ANGIO HEAD W OR WO CONTRAST  Result Date: 09/15/2019 CLINICAL DATA:  Follow-up examination for acute stroke. EXAM: CT ANGIOGRAPHY HEAD AND NECK TECHNIQUE: Multidetector CT imaging of the head and neck was performed using the standard protocol during bolus administration of intravenous contrast. Multiplanar CT image reconstructions and MIPs were obtained to evaluate the vascular anatomy. Carotid stenosis measurements (when applicable) are obtained utilizing NASCET criteria, using the distal  internal carotid diameter as the denominator. CONTRAST:  80mL OMNIPAQUE IOHEXOL 350 MG/ML SOLN COMPARISON:  Prior MRI from earlier the same day. FINDINGS: CTA NECK FINDINGS Aortic arch: Visualized aortic arch of normal caliber with normal branch pattern. No hemodynamically significant stenosis seen about the origin of the great vessels. Visualized subclavian arteries widely patent. Right carotid system: Right common and internal carotid arteries are diffusely tortuous but widely patent to the skull base without stenosis, dissection or occlusion. Right carotid artery system medialized into the retropharyngeal space. Left carotid system: Left common and internal carotid arteries are diffusely tortuous but widely patent to the skull base without stenosis, dissection or occlusion. Left carotid artery system medialized into the retropharyngeal space. Vertebral arteries: Both vertebral arteries arise from the subclavian arteries. Vertebral arteries mildly tortuous but widely patent within the neck without stenosis, dissection or occlusion. Skeleton: No acute osseous finding. No discrete lytic or blastic osseous lesions. Other neck: No other acute soft tissue abnormality within the neck. Enlarged multinodular right lobe of thyroid is seen. Dominant 19 mm hypodense right thyroid not (series 6, image 116). Left thyroid lobe hypoplastic and/or absent. Upper chest: Visualized upper chest demonstrates no acute finding. Partially visualized lungs  are largely clear. Review of the MIP images confirms the above findings CTA HEAD FINDINGS Anterior circulation: Both petrous segments widely patent. Mild scattered atheromatous plaque within the cavernous/supraclinoid ICAs without significant stenosis. A1 segments widely patent. Normal anterior communicating artery. Anterior cerebral arteries widely patent to their distal aspects without stenosis. M1 segments widely patent bilaterally. Normal MCA bifurcations. Distal MCA branches well  perfused and symmetric. Posterior circulation: Vertebral arteries mildly irregular but patent to the vertebrobasilar junction without flow-limiting stenosis. Patent right PICA. Left PICA not seen. Basilar widely patent to its distal aspect. Basilar tip somewhat ectatic without discrete aneurysm. Superior cerebral arteries patent bilaterally. Right PCA supplied via the basilar as well as a small right posterior communicating artery. Fetal type origin of the left PCA. PCAs mildly irregular but are well perfused to their distal aspects without significant stenosis. Venous sinuses: Grossly patent allowing for timing the contrast bolus. Anatomic variants: Fetal type origin left PCA. No intracranial aneurysm or other vascular abnormality. Review of the MIP images confirms the above findings IMPRESSION: 1. Negative CTA for large vessel occlusion. No high-grade or correctable stenosis identified. 2. Diffuse tortuosity of the major arterial vasculature of the head and neck, suggesting chronic underlying hypertension. 3. Enlarged multinodular right lobe of thyroid with dominant 19 mm right thyroid nodule. Further evaluation with dedicated thyroid ultrasound recommended. This could be performed on a nonemergent basis.(ref: J Am Coll Radiol. 2015 Feb;12(2): 143-50). Electronically Signed   By: Rise Mu M.D.   On: 09/15/2019 04:06   CT HEAD WO CONTRAST  Result Date: 09/14/2019 CLINICAL DATA:  Slurred speech, right-sided weakness. EXAM: CT HEAD WITHOUT CONTRAST TECHNIQUE: Contiguous axial images were obtained from the base of the skull through the vertex without intravenous contrast. COMPARISON:  Feb 26, 2019. FINDINGS: Brain: Mild chronic ischemic white matter disease is noted. No mass effect or midline shift is noted. Ventricular size is within normal limits. There is no evidence of mass lesion, hemorrhage or acute infarction. Vascular: No hyperdense vessel or unexpected calcification. Skull: Normal. Negative  for fracture or focal lesion. Sinuses/Orbits: No acute finding. Other: None. IMPRESSION: Mild chronic ischemic white matter disease. No acute intracranial abnormality seen. Electronically Signed   By: Lupita Raider M.D.   On: 09/14/2019 11:49   CT ANGIO NECK W OR WO CONTRAST  Result Date: 09/15/2019 CLINICAL DATA:  Follow-up examination for acute stroke. EXAM: CT ANGIOGRAPHY HEAD AND NECK TECHNIQUE: Multidetector CT imaging of the head and neck was performed using the standard protocol during bolus administration of intravenous contrast. Multiplanar CT image reconstructions and MIPs were obtained to evaluate the vascular anatomy. Carotid stenosis measurements (when applicable) are obtained utilizing NASCET criteria, using the distal internal carotid diameter as the denominator. CONTRAST:  43mL OMNIPAQUE IOHEXOL 350 MG/ML SOLN COMPARISON:  Prior MRI from earlier the same day. FINDINGS: CTA NECK FINDINGS Aortic arch: Visualized aortic arch of normal caliber with normal branch pattern. No hemodynamically significant stenosis seen about the origin of the great vessels. Visualized subclavian arteries widely patent. Right carotid system: Right common and internal carotid arteries are diffusely tortuous but widely patent to the skull base without stenosis, dissection or occlusion. Right carotid artery system medialized into the retropharyngeal space. Left carotid system: Left common and internal carotid arteries are diffusely tortuous but widely patent to the skull base without stenosis, dissection or occlusion. Left carotid artery system medialized into the retropharyngeal space. Vertebral arteries: Both vertebral arteries arise from the subclavian arteries. Vertebral arteries mildly tortuous but widely  patent within the neck without stenosis, dissection or occlusion. Skeleton: No acute osseous finding. No discrete lytic or blastic osseous lesions. Other neck: No other acute soft tissue abnormality within the neck.  Enlarged multinodular right lobe of thyroid is seen. Dominant 19 mm hypodense right thyroid not (series 6, image 116). Left thyroid lobe hypoplastic and/or absent. Upper chest: Visualized upper chest demonstrates no acute finding. Partially visualized lungs are largely clear. Review of the MIP images confirms the above findings CTA HEAD FINDINGS Anterior circulation: Both petrous segments widely patent. Mild scattered atheromatous plaque within the cavernous/supraclinoid ICAs without significant stenosis. A1 segments widely patent. Normal anterior communicating artery. Anterior cerebral arteries widely patent to their distal aspects without stenosis. M1 segments widely patent bilaterally. Normal MCA bifurcations. Distal MCA branches well perfused and symmetric. Posterior circulation: Vertebral arteries mildly irregular but patent to the vertebrobasilar junction without flow-limiting stenosis. Patent right PICA. Left PICA not seen. Basilar widely patent to its distal aspect. Basilar tip somewhat ectatic without discrete aneurysm. Superior cerebral arteries patent bilaterally. Right PCA supplied via the basilar as well as a small right posterior communicating artery. Fetal type origin of the left PCA. PCAs mildly irregular but are well perfused to their distal aspects without significant stenosis. Venous sinuses: Grossly patent allowing for timing the contrast bolus. Anatomic variants: Fetal type origin left PCA. No intracranial aneurysm or other vascular abnormality. Review of the MIP images confirms the above findings IMPRESSION: 1. Negative CTA for large vessel occlusion. No high-grade or correctable stenosis identified. 2. Diffuse tortuosity of the major arterial vasculature of the head and neck, suggesting chronic underlying hypertension. 3. Enlarged multinodular right lobe of thyroid with dominant 19 mm right thyroid nodule. Further evaluation with dedicated thyroid ultrasound recommended. This could be  performed on a nonemergent basis.(ref: J Am Coll Radiol. 2015 Feb;12(2): 143-50). Electronically Signed   By: Rise Mu M.D.   On: 09/15/2019 04:06   MR BRAIN WO CONTRAST  Result Date: 09/15/2019 CLINICAL DATA:  Initial evaluation for acute weakness. EXAM: MRI HEAD WITHOUT CONTRAST TECHNIQUE: Multiplanar, multiecho pulse sequences of the brain and surrounding structures were obtained without intravenous contrast. COMPARISON:  Prior CT from 09/14/2019. FINDINGS: Brain: Generalized age-related cerebral atrophy. Patchy and confluent T2/FLAIR hyperintensity within the periventricular deep white matter both cerebral hemispheres most consistent with chronic small vessel ischemic disease, severe in nature. Multiple scatter remote lacunar infarcts seen involving the bilateral basal ganglia, thalami, and periventricular white matter of the corona radiata. Associated chronic hemosiderin staining seen about several of these infarcts. Few scattered remote subcentimeter bilateral cerebellar infarcts noted as well. There is a 11 mm focus of restricted diffusion involving the posterior left basal ganglia, consistent with an acute ischemic perforator type infarct (series 5, image 73). No associated hemorrhage or mass effect. No other evidence for acute or subacute ischemia. No acute intracranial hemorrhage elsewhere within the brain. Multiple scattered chronic micro hemorrhages seen clustered about the, consistent with chronic poorly controlled hypertension. No mass lesion, midline shift or mass effect. No hydrocephalus. No extra-axial fluid collection. Pituitary gland and suprasellar region normal. Midline structures intact. Brainstem, and cerebellum Vascular: Major intracranial vascular flow voids are maintained. Skull and upper cervical spine: Craniocervical junction within normal limits. Bone marrow signal intensity normal. No scalp soft tissue abnormality. Sinuses/Orbits: Globes and orbital soft tissues  demonstrate no acute finding. Paranasal sinuses are largely clear. Trace right mastoid effusion, of doubtful significance. Inner ear structures normal. Other: None. IMPRESSION: 1. 11 mm acute ischemic nonhemorrhagic left  basal ganglia infarct. 2. Underlying age-related cerebral atrophy with severe chronic microvascular ischemic disease, with multiple remote lacunar infarcts as above. 3. Multiple scattered chronic micro hemorrhages clustered about the deep gray nuclei, brainstem, and cerebellum, consistent with chronic poorly controlled hypertension. Electronically Signed   By: Rise Mu M.D.   On: 09/15/2019 01:31   DG Chest Portable 1 View  Result Date: 09/15/2019 CLINICAL DATA:  CVA. Slurred speech. Right arm and leg weakness. EXAM: PORTABLE CHEST 1 VIEW COMPARISON:  Radiograph of 10/01/2018 FINDINGS: Borderline cardiomegaly stable from prior. Unchanged mediastinal contours with aortic tortuosity. Minor bibasilar atelectasis. No pulmonary edema, pleural fluid or pneumothorax. No acute osseous abnormalities are seen. IMPRESSION: 1. Borderline cardiomegaly, stable from prior. 2. Minor bibasilar atelectasis. Electronically Signed   By: Narda Rutherford M.D.   On: 09/15/2019 00:53   ECHOCARDIOGRAM COMPLETE  Result Date: 09/15/2019   ECHOCARDIOGRAM REPORT   Patient Name:   Bailey [name] Date of Exam: 09/15/2019 Medical Rec #:  [id number]        Height:       61.0 in Accession #:    [id number]       Weight:       200.0 lb Date of Birth:  09/10/1956        BSA:          1.89 m Patient Age:    63 years         BP:           116/79 mmHg Patient Gender: F                HR:           78 bpm. Exam Location:  Inpatient Procedure: 2D Echo Indications:    Stroke 434.91 / I163.9  History:        Patient has no prior history of Echocardiogram examinations.                 Stroke; Risk Factors:Hypertension and Non-Smoker. Elevated                 Troponin, PTSD, Acute renal injury.  Sonographer:    Jeryl Columbia Referring Phys: [phone] KAREN M BLACK IMPRESSIONS  1. Left ventricular ejection fraction, by visual estimation, is 65 to 70%. The left ventricle has normal function. There is mildly increased left ventricular hypertrophy.  2. Small left ventricular internal cavity size.  3. The left ventricle has no regional wall motion abnormalities.  4. Global right ventricle has normal systolic function.The right ventricular size is normal. No increase in right ventricular wall thickness.  5. Left atrial size was normal.  6. Right atrial size was normal.  7. Presence of pericardial fat pad.  8. Trivial pericardial effusion is present.  9. The mitral valve is grossly normal. No evidence of mitral valve regurgitation. 10. The tricuspid valve is grossly normal. Tricuspid valve regurgitation is trivial. 11. The aortic valve was not well visualized. Aortic valve regurgitation is not visualized. No evidence of aortic valve sclerosis or stenosis. 12. The pulmonic valve was not well visualized. Pulmonic valve regurgitation is not visualized. 13. TR signal is inadequate for assessing pulmonary artery systolic pressure. 14. The inferior vena cava is normal in size with greater than 50% respiratory variability, suggesting right atrial pressure of 3 mmHg. 15. No prior Echocardiogram. FINDINGS  Left Ventricle: Left ventricular ejection fraction, by visual estimation, is 65 to 70%. The left ventricle has normal function. The left ventricle has no  regional wall motion abnormalities. The left ventricular internal cavity size was the LV cavity size is small. There is mildly increased left ventricular hypertrophy. Concentric left ventricular hypertrophy. Left ventricular diastolic parameters are consistent with age-related delayed relaxation (normal). Normal left atrial pressure. Right Ventricle: The right ventricular size is normal. No increase in right ventricular wall thickness. Global RV systolic function is has normal systolic function.  Left Atrium: Left atrial size was normal in size. Right Atrium: Right atrial size was normal in size Pericardium: Trivial pericardial effusion is present. Presence of pericardial fat pad. Mitral Valve: The mitral valve is grossly normal. No evidence of mitral valve regurgitation. Tricuspid Valve: The tricuspid valve is grossly normal. Tricuspid valve regurgitation is trivial. Aortic Valve: The aortic valve was not well visualized. Aortic valve regurgitation is not visualized. The aortic valve is structurally normal, with no evidence of sclerosis or stenosis. Pulmonic Valve: The pulmonic valve was not well visualized. Pulmonic valve regurgitation is not visualized. Pulmonic regurgitation is not visualized. Aorta: The aortic root is normal in size and structure. Venous: The inferior vena cava is normal in size with greater than 50% respiratory variability, suggesting right atrial pressure of 3 mmHg. IAS/Shunts: No atrial level shunt detected by color flow Doppler.  LEFT VENTRICLE PLAX 2D LVIDd:         3.20 cm  Diastology LVIDs:         2.10 cm  LV e' lateral:   8.27 cm/s LV PW:         1.40 cm  LV E/e' lateral: 6.5 LV IVS:        1.50 cm  LV e' medial:    6.96 cm/s LVOT diam:     1.90 cm  LV E/e' medial:  7.8 LV SV:         27 ml LV SV Index:   13.13 LVOT Area:     2.84 cm  RIGHT VENTRICLE TAPSE (M-mode): 2.4 cm LEFT ATRIUM           Index       RIGHT ATRIUM           Index LA diam:      4.10 cm 2.17 cm/m  RA Area:     14.90 cm LA Vol (A2C): 33.3 ml 17.63 ml/m RA Volume:   35.60 ml  18.85 ml/m LA Vol (A4C): 39.2 ml 20.75 ml/m   AORTA Ao Root diam: 2.90 cm MITRAL VALVE MV Area (PHT): 4.12 cm             SHUNTS MV PHT:        53.46 msec           Systemic Diam: 1.90 cm MV Decel Time: 184 msec MV E velocity: 54.15 cm/s 103 cm/s MV A velocity: 61.60 cm/s 70.3 cm/s MV E/A ratio:  0.88       1.5  Lennie OdorWesley O'Neal MD Electronically signed by Lennie OdorWesley O'Neal MD Signature Date/Time: 09/15/2019/10:39:30 AM    Final      Assessment/Plan   #1  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  2.  History of hypertension as noted above her Norvasc was recently increased because of some higher readings she is on Norvasc 10 mg a day clonidine 0.2 mg twice daily as well as Cozaar  100 mg a day and Cardura 8 mg nightly this appears to have improved with increase in Norvasc at this point will monitor.  3.  History of itching with wheezing this appears to have improved her Robaxin was discontinued she was treated initially with hydroxyzine which has been discontinued she continues on Benadryl as needed triamcinolone cream she is also on Zyrtec as well as Neosporin-and this appears to have improved.  She also was given nebulizers for wheezing which appears to have resolved.  4.  History of anxiety she is on Celexa 40 mg a day as well as clonazepam which is now 3 times daily 0.5 mg.  This is complicated somewhat with a history of psychosis she is on Seroquel 50 mg nightly.  At this point appears to be stable.  5.  History of hyperlipidemia not stated as uncontrolled she is on Crestor 20 mg a day.  6.  History of hypokalemia and low magnesium this was repleted in the hospital -she is on potassium supplementation 20 mEq twice daily.  Will update a BMP   7.  History of pain more so on the right side she does have orders for Neurontin.  [id number]

## 2019-10-13 NOTE — Progress Notes (Signed)
[facility]    Place of Service:   SNF Provider:Nicholaos Schippers D Lyn HollingsheadAlexander Bailey [name]  Georgann HousekeeperHusain, Karrar, Bailey [name]  Patient Care Team: Georgann HousekeeperHusain, Karrar, Bailey [name] as PCP - General (Internal Medicine) Georgann HousekeeperHusain, Karrar, Bailey [name] (Internal Medicine)  Extended Emergency Contact Information Primary Emergency Contact: Green,Margie Address: [address]          [place], [place] [id number] [place] Home Phone: [phone] Mobile Phone: [phone] Relation: Mother Secondary Emergency Contact: Richardson,Cynthia  [place] Home Phone: [phone] Relation: Sister    Allergies: Codeine, Sulfa antibiotics, and Sulfa drugs cross reactors  Chief Complaint  Patient presents with  . Acute Visit    HPI: Patient is 64 y.o. female who is being seen today for elevated blood pressure of 162/104.  Patient is not having any symptoms with this blood pressure.  Review of patient's prior blood pressure shows that she is gone into the 140s to 150 and occasional 160 systolic range prior.  Past Medical History:  Diagnosis Date  . Anxiety    "Breakdown" in the 1990's, hosp. for >1 wk. at [facility]    . Anxiety   . Arthritis    OA- R knee, L hip   . Asthma    years ago. "no problems in a long while."  . Constipation   . Depression   . GERD (gastroesophageal reflux disease)   . Goiter    removed  at age 64  . Hypertension    all BP meds. managed by Dr. Eula ListenHussain    . Hypertension   . Incontinence of urine    weak bladder  . Pneumonia    [facility] told that she had pneumonia & was isolated   . Stroke (HCC)    2005, R sided weakness   . Stroke [facility](HCC) 2005   ? weakness in hand and arms per mother. patient unsure    Past Surgical History:  Procedure Laterality Date  . ABDOMINAL HYSTERECTOMY    . COLONOSCOPY    . DILATION AND CURETTAGE OF UTERUS     1980's  . goiter     removed age 64  . JOINT REPLACEMENT  10/2011   Rt knee  . JOINT REPLACEMENT Right    knee  .  THYROIDECTOMY, PARTIAL    . TONSILLECTOMY    . TOTAL HIP ARTHROPLASTY  12/23/2011   Procedure: TOTAL HIP ARTHROPLASTY;  Surgeon: Nestor LewandowskyFrank J Rowan, Bailey [name];  Location: [facility] OR;  Service: Orthopedics;  Laterality: Left;  DEPUY/PINNACLE CUPS/SROM STEM  . TOTAL HIP ARTHROPLASTY Left   . TOTAL HIP ARTHROPLASTY Right 02/15/2015   Procedure: TOTAL HIP ARTHROPLASTY;  Surgeon: Gean BirchwoodFrank Rowan, Bailey [name];  Location: [facility] OR;  Service: Orthopedics;  Laterality: Right;  . TOTAL KNEE ARTHROPLASTY  10/21/2011   Procedure: TOTAL KNEE ARTHROPLASTY;  Surgeon: Nestor LewandowskyFrank J Rowan, Bailey [name];  Location: [facility] OR;  Service: Orthopedics;  Laterality: Right;  DEPUY SIGMA RP  . TUBAL LIGATION      Allergies as of 10/08/2019      Reactions   Codeine Nausea Only, Other (See Comments)   headaches   Sulfa Antibiotics Nausea Only   Upset stomach, headaches   Sulfa Drugs Cross Reactors Other (See Comments)   Its been a long time ago      Medication List       Accurate as of October 08, 2019 11:59 PM. If you have any questions, ask your nurse or doctor.        acetaminophen  325 MG tablet Commonly known as: TYLENOL Take 2 tablets (650 mg total) by mouth every 6 (six) hours as needed for mild pain (or Fever >/= 101).   amLODipine 2.5 MG tablet Commonly known as: NORVASC Take 2.5 mg by mouth daily.   aspirin EC 325 MG tablet Take 1 tablet (325 mg total) by mouth 2 (two) times daily.   camphor-menthol lotion Commonly known as: SARNA Apply 1 application topically 3 (three) times daily. Discontinue when itching resolved   cetirizine 10 MG tablet Commonly known as: ZYRTEC Take 10 mg by mouth daily.   citalopram 40 MG tablet Commonly known as: CELEXA Take 40 mg by mouth daily.   clonazePAM 0.5 MG tablet Commonly known as: KLONOPIN Take 1 tablet (0.5 mg total) by mouth 2 (two) times daily.   cloNIDine 0.2 MG tablet Commonly known as: CATAPRES Take 1 tablet (0.2 mg total) by mouth 2 (two) times daily.   clopidogrel 75 MG tablet Commonly  known as: PLAVIX Take 1 tablet (75 mg total) by mouth daily.   diphenhydrAMINE 25 MG tablet Commonly known as: BENADRYL Take 25 mg by mouth every 6 (six) hours as needed for itching.   doxazosin 8 MG tablet Commonly known as: CARDURA Take 8 mg by mouth at bedtime.   FLAX SEED OIL PO Take 1,000 mg by mouth daily.   gabapentin 300 MG capsule Commonly known as: NEURONTIN Take 300 mg by mouth 3 (three) times daily.   hydrOXYzine 25 MG tablet Commonly known as: ATARAX/VISTARIL Take 25 mg by mouth every 6 (six) hours. x6 days for itching/skin rash   ipratropium-albuterol 0.5-2.5 (3) MG/3ML Soln Commonly known as: DUONEB Take 3 mLs by nebulization every 6 (six) hours.   losartan 100 MG tablet Commonly known as: COZAAR Take 100 mg by mouth daily.   metoprolol tartrate 25 MG tablet Commonly known as: LOPRESSOR Take 0.5 tablets (12.5 mg total) by mouth 2 (two) times daily.   montelukast 10 MG tablet Commonly known as: SINGULAIR Take 10 mg by mouth at bedtime.   neomycin-bacitracin-polymyxin [phone] ointment Apply 1 application topically every 6 (six) hours as needed (Itching).   omeprazole 20 MG capsule Commonly known as: PRILOSEC Take 40 mg by mouth daily.   potassium chloride SA 20 MEQ tablet Commonly known as: KLOR-CON Take 20 mEq by mouth 2 (two) times daily.   QUEtiapine 50 MG tablet Commonly known as: SEROQUEL Take 1 tablet (50 mg total) by mouth at bedtime.   rosuvastatin 20 MG tablet Commonly known as: CRESTOR Take 1 tablet (20 mg total) by mouth daily at 6 PM.   triamcinolone cream 0.1 % Commonly known as: KENALOG Apply 1 application topically daily as needed (Itching).   VITAMIN B 12 PO Take 1,000 mg by mouth daily.   vitamin C 500 MG tablet Commonly known as: ASCORBIC ACID Take 500 mg by mouth daily.   Vitamin D 50 MCG (2000 UT) tablet Take 2,000 Units by mouth daily.       No orders of the defined types were placed in this  encounter.   Immunization History  Administered Date(s) Administered  . Influenza-Unspecified 05/31/2014, 07/01/2019  . PPD Test 03/01/2015    Social History   Tobacco Use  . Smoking status: Never Smoker  . Smokeless tobacco: Never Used  Substance Use Topics  . Alcohol use: Yes    Comment: 2x's /month, socially      Review of Systems  GENERAL:  no fevers, fatigue, appetite changes SKIN: No itching,  rash HEENT: No complaint RESPIRATORY: No cough, wheezing, SOB CARDIAC: No chest pain, palpitations, lower extremity edema  GI: No abdominal pain, No N/V/D or constipation, No heartburn or reflux  GU: No dysuria, frequency or urgency, or incontinence  MUSCULOSKELETAL: No unrelieved bone/joint pain NEUROLOGIC: No headache, dizziness  PSYCHIATRIC: No overt anxiety or sadness  Vitals:   10/13/19 1602  BP: (!) 162/104  Pulse: 83  Resp: 18  Temp: 98.4 F (36.9 C)   There is no height or weight on file to calculate BMI. Physical Exam  GENERAL APPEARANCE: Alert, conversant, No acute distress  SKIN: No diaphoresis rash HEENT: Unremarkable RESPIRATORY: Breathing is even, unlabored. Lung sounds are clear   CARDIOVASCULAR: Heart RRR no murmurs, rubs or gallops. No peripheral edema  GASTROINTESTINAL: Abdomen is soft, non-tender, not distended w/ normal bowel sounds.  GENITOURINARY: Bladder non tender, not distended  MUSCULOSKELETAL: No abnormal joints or musculature NEUROLOGIC: Cranial nerves 2-12 grossly intact. Moves all extremities PSYCHIATRIC: Mood and affect appropriate to situation, emotions have been labile status post recent CVA  Patient Active Problem List   Diagnosis Date Noted  . Urticaria 10/01/2019  . Wheezing 10/01/2019  . Hyperlipidemia 09/26/2019  . Hypomagnesemia 09/17/2019  . 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 Depression 09/15/2019  . 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 Mild cognitive impairment 03/11/2017  . Syncopal episodes 05/16/2015  . Acute renal  injury (HCC) 05/16/2015  . Elevated troponin 05/16/2015  . Hypertension 05/16/2015  . Non compliance w medication regimen 05/16/2015  . Pulmonary nodule 05/16/2015  . Pressure ulcer 02/28/2015  . Dehydration 02/26/2015  . Intractable vomiting with nausea 02/26/2015  . Hypokalemia 02/25/2015  . Abdominal pain   . Primary osteoarthritis of right hip 02/15/2015  . Severe recurrent major depressive disorder with psychotic features (HCC) 09/01/2014  . GAD (generalized anxiety disorder) 09/01/2014  . PTSD (post-traumatic stress disorder) 09/01/2014  . Hip arthritis left 12/23/2011  . 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 10/21/2011    CMP     Component Value Date/Time   NA 144 09/20/2019 0000   K 3.6 09/20/2019 0000   CL 104 09/20/2019 0000   CO2 27 (A) 09/20/2019 0000   GLUCOSE 100 (H) 09/17/2019 0254   BUN 13 09/20/2019 0000   CREATININE 0.7 09/20/2019 0000   CREATININE 0.79 09/17/2019 0254   CALCIUM 8.4 (A) 09/20/2019 0000   PROT 6.8 09/14/2019 1126   ALBUMIN 3.5 09/14/2019 1126   AST 17 09/14/2019 1126   ALT 15 09/14/2019 1126   ALKPHOS 95 09/14/2019 1126   BILITOT 1.2 09/14/2019 1126   GFRNONAA 89.41 09/20/2019 0000   GFRAA 90 09/20/2019 0000   Recent Labs    09/15/19 0636 09/16/19 0249 09/17/19 0254 09/20/19 0000  NA 146* 145 143 144  K 4.0 3.3* 3.5 3.6  CL 107 106 106 104  CO2 30 28 29  27*  GLUCOSE 96 113* 100*  --   BUN 13 12 10 13   CREATININE 0.91 0.86 0.79 0.7  CALCIUM 8.8* 8.8* 8.7* 8.4*  MG 1.6*  --   --   --    Recent Labs    09/14/19 1126  AST 17  ALT 15  ALKPHOS 95  BILITOT 1.2  PROT 6.8  ALBUMIN 3.5   Recent Labs    09/14/19 1126 09/20/19 0000  WBC 3.9* 5.2  NEUTROABS 2.2 3  HGB 12.6 12.1  HCT 35.5* 34*  MCV 82.6  --   PLT 266 235   Recent Labs  09/15/19 0636  CHOL 175  LDLCALC 114*  TRIG 42   No results found for: [facility] Lab Results  Component Value Date   TSH 0.174 (L) 05/17/2015   Lab Results   Component Value Date   HGBA1C 5.2 09/15/2019   Lab Results  Component Value Date   CHOL 175 09/15/2019   HDL 53 09/15/2019   LDLCALC 114 (H) 09/15/2019   TRIG 42 09/15/2019   CHOLHDL 3.3 09/15/2019    Significant Diagnostic Results in last 30 days:  CT ANGIO HEAD W OR WO CONTRAST  Result Date: 09/15/2019 CLINICAL DATA:  Follow-up examination for acute stroke. EXAM: CT ANGIOGRAPHY HEAD AND NECK TECHNIQUE: Multidetector CT imaging of the head and neck was performed using the standard protocol during bolus administration of intravenous contrast. Multiplanar CT image reconstructions and MIPs were obtained to evaluate the vascular anatomy. Carotid stenosis measurements (when applicable) are obtained utilizing NASCET criteria, using the distal internal carotid diameter as the denominator. CONTRAST:  15mL OMNIPAQUE IOHEXOL 350 MG/ML SOLN COMPARISON:  Prior MRI from earlier the same day. FINDINGS: CTA NECK FINDINGS Aortic arch: Visualized aortic arch of normal caliber with normal branch pattern. No hemodynamically significant stenosis seen about the origin of the great vessels. Visualized subclavian arteries widely patent. Right carotid system: Right common and internal carotid arteries are diffusely tortuous but widely patent to the skull base without stenosis, dissection or occlusion. Right carotid artery system medialized into the retropharyngeal space. Left carotid system: Left common and internal carotid arteries are diffusely tortuous but widely patent to the skull base without stenosis, dissection or occlusion. Left carotid artery system medialized into the retropharyngeal space. Vertebral arteries: Both vertebral arteries arise from the subclavian arteries. Vertebral arteries mildly tortuous but widely patent within the neck without stenosis, dissection or occlusion. Skeleton: No acute osseous finding. No discrete lytic or blastic osseous lesions. Other neck: No other acute soft tissue abnormality  within the neck. Enlarged multinodular right lobe of thyroid is seen. Dominant 19 mm hypodense right thyroid not (series 6, image 116). Left thyroid lobe hypoplastic and/or absent. Upper chest: Visualized upper chest demonstrates no acute finding. Partially visualized lungs are largely clear. Review of the MIP images confirms the above findings CTA HEAD FINDINGS Anterior circulation: Both petrous segments widely patent. Mild scattered atheromatous plaque within the cavernous/supraclinoid ICAs without significant stenosis. A1 segments widely patent. Normal anterior communicating artery. Anterior cerebral arteries widely patent to their distal aspects without stenosis. M1 segments widely patent bilaterally. Normal MCA bifurcations. Distal MCA branches well perfused and symmetric. Posterior circulation: Vertebral arteries mildly irregular but patent to the vertebrobasilar junction without flow-limiting stenosis. Patent right PICA. Left PICA not seen. Basilar widely patent to its distal aspect. Basilar tip somewhat ectatic without discrete aneurysm. Superior cerebral arteries patent bilaterally. Right PCA supplied via the basilar as well as a small right posterior communicating artery. Fetal type origin of the left PCA. PCAs mildly irregular but are well perfused to their distal aspects without significant stenosis. Venous sinuses: Grossly patent allowing for timing the contrast bolus. Anatomic variants: Fetal type origin left PCA. No intracranial aneurysm or other vascular abnormality. Review of the MIP images confirms the above findings IMPRESSION: 1. Negative CTA for large vessel occlusion. No high-grade or correctable stenosis identified. 2. Diffuse tortuosity of the major arterial vasculature of the head and neck, suggesting chronic underlying hypertension. 3. Enlarged multinodular right lobe of thyroid with dominant 19 mm right thyroid nodule. Further evaluation with dedicated thyroid ultrasound recommended. This  could be performed on a nonemergent basis.(ref: J Am Coll Radiol. 2015 Feb;12(2): 143-50). Electronically Signed   By: Rise Mu M.D.   On: 09/15/2019 04:06   CT HEAD WO CONTRAST  Result Date: 09/14/2019 CLINICAL DATA:  Slurred speech, right-sided weakness. EXAM: CT HEAD WITHOUT CONTRAST TECHNIQUE: Contiguous axial images were obtained from the base of the skull through the vertex without intravenous contrast. COMPARISON:  Feb 26, 2019. FINDINGS: Brain: Mild chronic ischemic white matter disease is noted. No mass effect or midline shift is noted. Ventricular size is within normal limits. There is no evidence of mass lesion, hemorrhage or acute infarction. Vascular: No hyperdense vessel or unexpected calcification. Skull: Normal. Negative for fracture or focal lesion. Sinuses/Orbits: No acute finding. Other: None. IMPRESSION: Mild chronic ischemic white matter disease. No acute intracranial abnormality seen. Electronically Signed   By: Lupita Raider M.D.   On: 09/14/2019 11:49   CT ANGIO NECK W OR WO CONTRAST  Result Date: 09/15/2019 CLINICAL DATA:  Follow-up examination for acute stroke. EXAM: CT ANGIOGRAPHY HEAD AND NECK TECHNIQUE: Multidetector CT imaging of the head and neck was performed using the standard protocol during bolus administration of intravenous contrast. Multiplanar CT image reconstructions and MIPs were obtained to evaluate the vascular anatomy. Carotid stenosis measurements (when applicable) are obtained utilizing NASCET criteria, using the distal internal carotid diameter as the denominator. CONTRAST:  23mL OMNIPAQUE IOHEXOL 350 MG/ML SOLN COMPARISON:  Prior MRI from earlier the same day. FINDINGS: CTA NECK FINDINGS Aortic arch: Visualized aortic arch of normal caliber with normal branch pattern. No hemodynamically significant stenosis seen about the origin of the great vessels. Visualized subclavian arteries widely patent. Right carotid system: Right common and internal  carotid arteries are diffusely tortuous but widely patent to the skull base without stenosis, dissection or occlusion. Right carotid artery system medialized into the retropharyngeal space. Left carotid system: Left common and internal carotid arteries are diffusely tortuous but widely patent to the skull base without stenosis, dissection or occlusion. Left carotid artery system medialized into the retropharyngeal space. Vertebral arteries: Both vertebral arteries arise from the subclavian arteries. Vertebral arteries mildly tortuous but widely patent within the neck without stenosis, dissection or occlusion. Skeleton: No acute osseous finding. No discrete lytic or blastic osseous lesions. Other neck: No other acute soft tissue abnormality within the neck. Enlarged multinodular right lobe of thyroid is seen. Dominant 19 mm hypodense right thyroid not (series 6, image 116). Left thyroid lobe hypoplastic and/or absent. Upper chest: Visualized upper chest demonstrates no acute finding. Partially visualized lungs are largely clear. Review of the MIP images confirms the above findings CTA HEAD FINDINGS Anterior circulation: Both petrous segments widely patent. Mild scattered atheromatous plaque within the cavernous/supraclinoid ICAs without significant stenosis. A1 segments widely patent. Normal anterior communicating artery. Anterior cerebral arteries widely patent to their distal aspects without stenosis. M1 segments widely patent bilaterally. Normal MCA bifurcations. Distal MCA branches well perfused and symmetric. Posterior circulation: Vertebral arteries mildly irregular but patent to the vertebrobasilar junction without flow-limiting stenosis. Patent right PICA. Left PICA not seen. Basilar widely patent to its distal aspect. Basilar tip somewhat ectatic without discrete aneurysm. Superior cerebral arteries patent bilaterally. Right PCA supplied via the basilar as well as a small right posterior communicating  artery. Fetal type origin of the left PCA. PCAs mildly irregular but are well perfused to their distal aspects without significant stenosis. Venous sinuses: Grossly patent allowing for timing the contrast bolus. Anatomic variants: Fetal type origin left PCA.  No intracranial aneurysm or other vascular abnormality. Review of the MIP images confirms the above findings IMPRESSION: 1. Negative CTA for large vessel occlusion. No high-grade or correctable stenosis identified. 2. Diffuse tortuosity of the major arterial vasculature of the head and neck, suggesting chronic underlying hypertension. 3. Enlarged multinodular right lobe of thyroid with dominant 19 mm right thyroid nodule. Further evaluation with dedicated thyroid ultrasound recommended. This could be performed on a nonemergent basis.(ref: J Am Coll Radiol. 2015 Feb;12(2): 143-50). Electronically Signed   By: Rise MuBenjamin  McClintock M.D.   On: 09/15/2019 04:06   MR BRAIN WO CONTRAST  Result Date: 09/15/2019 CLINICAL DATA:  Initial evaluation for acute weakness. EXAM: MRI HEAD WITHOUT CONTRAST TECHNIQUE: Multiplanar, multiecho pulse sequences of the brain and surrounding structures were obtained without intravenous contrast. COMPARISON:  Prior CT from 09/14/2019. FINDINGS: Brain: Generalized age-related cerebral atrophy. Patchy and confluent T2/FLAIR hyperintensity within the periventricular deep white matter both cerebral hemispheres most consistent with chronic small vessel ischemic disease, severe in nature. Multiple scatter remote lacunar infarcts seen involving the bilateral basal ganglia, thalami, and periventricular white matter of the corona radiata. Associated chronic hemosiderin staining seen about several of these infarcts. Few scattered remote subcentimeter bilateral cerebellar infarcts noted as well. There is a 11 mm focus of restricted diffusion involving the posterior left basal ganglia, consistent with an acute ischemic perforator type infarct  (series 5, image 73). No associated hemorrhage or mass effect. No other evidence for acute or subacute ischemia. No acute intracranial hemorrhage elsewhere within the brain. Multiple scattered chronic micro hemorrhages seen clustered about the, consistent with chronic poorly controlled hypertension. No mass lesion, midline shift or mass effect. No hydrocephalus. No extra-axial fluid collection. Pituitary gland and suprasellar region normal. Midline structures intact. Brainstem, and cerebellum Vascular: Major intracranial vascular flow voids are maintained. Skull and upper cervical spine: Craniocervical junction within normal limits. Bone marrow signal intensity normal. No scalp soft tissue abnormality. Sinuses/Orbits: Globes and orbital soft tissues demonstrate no acute finding. Paranasal sinuses are largely clear. Trace right mastoid effusion, of doubtful significance. Inner ear structures normal. Other: None. IMPRESSION: 1. 11 mm acute ischemic nonhemorrhagic left basal ganglia infarct. 2. Underlying age-related cerebral atrophy with severe chronic microvascular ischemic disease, with multiple remote lacunar infarcts as above. 3. Multiple scattered chronic micro hemorrhages clustered about the deep gray nuclei, brainstem, and cerebellum, consistent with chronic poorly controlled hypertension. Electronically Signed   By: Rise MuBenjamin  McClintock M.D.   On: 09/15/2019 01:31   DG Chest Portable 1 View  Result Date: 09/15/2019 CLINICAL DATA:  CVA. Slurred speech. Right arm and leg weakness. EXAM: PORTABLE CHEST 1 VIEW COMPARISON:  Radiograph of 10/01/2018 FINDINGS: Borderline cardiomegaly stable from prior. Unchanged mediastinal contours with aortic tortuosity. Minor bibasilar atelectasis. No pulmonary edema, pleural fluid or pneumothorax. No acute osseous abnormalities are seen. IMPRESSION: 1. Borderline cardiomegaly, stable from prior. 2. Minor bibasilar atelectasis. Electronically Signed   By: Narda RutherfordMelanie  Sanford  M.D.   On: 09/15/2019 00:53   ECHOCARDIOGRAM COMPLETE  Result Date: 09/15/2019   ECHOCARDIOGRAM REPORT   Patient Name:   Bailey [name] Date of Exam: 09/15/2019 Medical Rec #:  [id number]        Height:       61.0 in Accession #:    [id number]       Weight:       200.0 lb Date of Birth:  [birth year]        BSA:          1.89  m Patient Age:    63 years         BP:           116/79 mmHg Patient Gender: F                HR:           78 bpm. Exam Location:  Inpatient Procedure: 2D Echo Indications:    Stroke 434.91 / I163.9  History:        Patient has no prior history of Echocardiogram examinations.                 Stroke; Risk Factors:Hypertension and Non-Smoker. Elevated                 Troponin, PTSD, Acute renal injury.  Sonographer:    Jeryl Columbia Referring Phys: [phone] KAREN M BLACK IMPRESSIONS  1. Left ventricular ejection fraction, by visual estimation, is 65 to 70%. The left ventricle has normal function. There is mildly increased left ventricular hypertrophy.  2. Small left ventricular internal cavity size.  3. The left ventricle has no regional wall motion abnormalities.  4. Global right ventricle has normal systolic function.The right ventricular size is normal. No increase in right ventricular wall thickness.  5. Left atrial size was normal.  6. Right atrial size was normal.  7. Presence of pericardial fat pad.  8. Trivial pericardial effusion is present.  9. The mitral valve is grossly normal. No evidence of mitral valve regurgitation. 10. The tricuspid valve is grossly normal. Tricuspid valve regurgitation is trivial. 11. The aortic valve was not well visualized. Aortic valve regurgitation is not visualized. No evidence of aortic valve sclerosis or stenosis. 12. The pulmonic valve was not well visualized. Pulmonic valve regurgitation is not visualized. 13. TR signal is inadequate for assessing pulmonary artery systolic pressure. 14. The inferior vena cava is normal in size with greater than 50%  respiratory variability, suggesting right atrial pressure of 3 mmHg. 15. No prior Echocardiogram. FINDINGS  Left Ventricle: Left ventricular ejection fraction, by visual estimation, is 65 to 70%. The left ventricle has normal function. The left ventricle has no regional wall motion abnormalities. The left ventricular internal cavity size was the LV cavity size is small. There is mildly increased left ventricular hypertrophy. Concentric left ventricular hypertrophy. Left ventricular diastolic parameters are consistent with age-related delayed relaxation (normal). Normal left atrial pressure. Right Ventricle: The right ventricular size is normal. No increase in right ventricular wall thickness. Global RV systolic function is has normal systolic function. Left Atrium: Left atrial size was normal in size. Right Atrium: Right atrial size was normal in size Pericardium: Trivial pericardial effusion is present. Presence of pericardial fat pad. Mitral Valve: The mitral valve is grossly normal. No evidence of mitral valve regurgitation. Tricuspid Valve: The tricuspid valve is grossly normal. Tricuspid valve regurgitation is trivial. Aortic Valve: The aortic valve was not well visualized. Aortic valve regurgitation is not visualized. The aortic valve is structurally normal, with no evidence of sclerosis or stenosis. Pulmonic Valve: The pulmonic valve was not well visualized. Pulmonic valve regurgitation is not visualized. Pulmonic regurgitation is not visualized. Aorta: The aortic root is normal in size and structure. Venous: The inferior vena cava is normal in size with greater than 50% respiratory variability, suggesting right atrial pressure of 3 mmHg. IAS/Shunts: No atrial level shunt detected by color flow Doppler.  LEFT VENTRICLE PLAX 2D LVIDd:         3.20 cm  Diastology  LVIDs:         2.10 cm  LV e' lateral:   8.27 cm/s LV PW:         1.40 cm  LV E/e' lateral: 6.5 LV IVS:        1.50 cm  LV e' medial:    6.96 cm/s  LVOT diam:     1.90 cm  LV E/e' medial:  7.8 LV SV:         27 ml LV SV Index:   13.13 LVOT Area:     2.84 cm  RIGHT VENTRICLE TAPSE (M-mode): 2.4 cm LEFT ATRIUM           Index       RIGHT ATRIUM           Index LA diam:      4.10 cm 2.17 cm/m  RA Area:     14.90 cm LA Vol (A2C): 33.3 ml 17.63 ml/m RA Volume:   35.60 ml  18.85 ml/m LA Vol (A4C): 39.2 ml 20.75 ml/m   AORTA Ao Root diam: 2.90 cm MITRAL VALVE MV Area (PHT): 4.12 cm             SHUNTS MV PHT:        53.46 msec           Systemic Diam: 1.90 cm MV Decel Time: 184 msec MV E velocity: 54.15 cm/s 103 cm/s MV A velocity: 61.60 cm/s 70.3 cm/s MV E/A ratio:  0.88       1.5  Lennie Odor Bailey [name] Electronically signed by Lennie Odor Bailey [name] Signature Date/Time: 09/15/2019/10:39:30 AM    Final     Assessment and Plan  Hypertension with elevated blood pressure-prior blood pressures have been evaluated with systolic blood pressures being elevated to the mid 140s to 150s and even occasionally 160 range prior, all without symptoms.  Patient is currently on Catapres 0.2 mg twice daily, Cardura 8 mg nightly, Cozaar 100 mg daily, and Lopressor 12.5 mg twice daily, and Norvasc 2.5 mg daily.  It seems reasonable to increase Norvasc to 10 mg daily and will monitor response.    Bailey Hanks, Bailey [name]

## 2019-10-14 LAB — CBC AND DIFFERENTIAL
HCT: 38 (ref 36–46)
Hemoglobin: 13.2 (ref 12.0–16.0)
Platelets: 312 (ref 150–399)
WBC: 3.9

## 2019-10-14 LAB — BASIC METABOLIC PANEL
BUN: 9 (ref 4–21)
CO2: 26 — AB (ref 13–22)
Chloride: 104 (ref 99–108)
Creatinine: 0.6 (ref 0.5–1.1)
Glucose: 93
Potassium: 3.5 (ref 3.4–5.3)
Sodium: 144 (ref 137–147)

## 2019-10-14 LAB — COMPREHENSIVE METABOLIC PANEL
Calcium: 8.9 (ref 8.7–10.7)
GFR calc Af Amer: >90
GFR calc non Af Amer: >90

## 2019-10-14 LAB — CBC: RBC: 4.58 (ref 3.87–5.11)

## 2019-10-15 ENCOUNTER — Other Ambulatory Visit: Payer: Self-pay

## 2019-10-15 ENCOUNTER — Encounter: Payer: Self-pay | Admitting: Internal Medicine

## 2019-10-15 ENCOUNTER — Emergency Department (HOSPITAL_COMMUNITY)
Admission: EM | Admit: 2019-10-15 | Discharge: 2019-10-15 | Disposition: A | Discharge status: Home / Self Care | Payer: Medicare Other | Attending: Emergency Medicine | Admitting: Emergency Medicine

## 2019-10-15 ENCOUNTER — Encounter (HOSPITAL_COMMUNITY): Payer: Self-pay

## 2019-10-15 ENCOUNTER — Emergency Department (HOSPITAL_COMMUNITY): Payer: Medicare Other

## 2019-10-15 ENCOUNTER — Non-Acute Institutional Stay (SKILLED_NURSING_FACILITY): Payer: Medicare Other | Admitting: Internal Medicine

## 2019-10-15 DIAGNOSIS — R55 Syncope and collapse: Secondary | ICD-10-CM | POA: Diagnosis present

## 2019-10-15 DIAGNOSIS — I639 Cerebral infarction, unspecified: Secondary | ICD-10-CM

## 2019-10-15 DIAGNOSIS — Z7982 Long term (current) use of aspirin: Secondary | ICD-10-CM | POA: Insufficient documentation

## 2019-10-15 DIAGNOSIS — Z7901 Long term (current) use of anticoagulants: Secondary | ICD-10-CM | POA: Insufficient documentation

## 2019-10-15 DIAGNOSIS — I10 Essential (primary) hypertension: Secondary | ICD-10-CM

## 2019-10-15 DIAGNOSIS — R4189 Other symptoms and signs involving cognitive functions and awareness: Secondary | ICD-10-CM

## 2019-10-15 DIAGNOSIS — Z79899 Other long term (current) drug therapy: Secondary | ICD-10-CM | POA: Diagnosis not present

## 2019-10-15 DIAGNOSIS — I6381 Other cerebral infarction due to occlusion or stenosis of small artery: Secondary | ICD-10-CM

## 2019-10-15 LAB — COMPREHENSIVE METABOLIC PANEL
ALT: 38 U/L (ref 0–44)
AST: 28 U/L (ref 15–41)
Albumin: 3 g/dL — ABNORMAL LOW (ref 3.5–5.0)
Alkaline Phosphatase: 89 U/L (ref 38–126)
Anion gap: 9 (ref 5–15)
BUN: 13 mg/dL (ref 8–23)
CO2: 29 mmol/L (ref 22–32)
Calcium: 8.7 mg/dL — ABNORMAL LOW (ref 8.9–10.3)
Chloride: 106 mmol/L (ref 98–111)
Creatinine, Ser: 0.78 mg/dL (ref 0.44–1.00)
GFR calc Af Amer: >60 mL/min (ref >60)
GFR calc non Af Amer: >60 mL/min (ref >60)
Glucose, Bld: 95 mg/dL (ref 70–99)
Potassium: 3.7 mmol/L (ref 3.5–5.1)
Sodium: 144 mmol/L (ref 135–145)
Total Bilirubin: 0.6 mg/dL (ref 0.3–1.2)
Total Protein: 6.2 g/dL — ABNORMAL LOW (ref 6.5–8.1)

## 2019-10-15 LAB — CBC WITH DIFFERENTIAL/PLATELET
Abs Immature Granulocytes: 0.01 10*3/uL (ref 0.00–0.07)
Basophils Absolute: 0.1 10*3/uL (ref 0.0–0.1)
Basophils Relative: 2 %
Eosinophils Absolute: 0.5 10*3/uL (ref 0.0–0.5)
Eosinophils Relative: 15 %
HCT: 34.2 % — ABNORMAL LOW (ref 36.0–46.0)
Hemoglobin: 12 g/dL (ref 12.0–15.0)
Immature Granulocytes: 0 %
Lymphocytes Relative: 36 %
Lymphs Abs: 1.3 10*3/uL (ref 0.7–4.0)
MCH: 28.5 pg (ref 26.0–34.0)
MCHC: 35.1 g/dL (ref 30.0–36.0)
MCV: 81.2 fL (ref 80.0–100.0)
Monocytes Absolute: 0.4 10*3/uL (ref 0.1–1.0)
Monocytes Relative: 11 %
Neutro Abs: 1.3 10*3/uL — ABNORMAL LOW (ref 1.7–7.7)
Neutrophils Relative %: 36 %
Platelets: 281 10*3/uL (ref 150–400)
RBC: 4.21 MIL/uL (ref 3.87–5.11)
RDW: 13.8 % (ref 11.5–15.5)
WBC: 3.6 10*3/uL — ABNORMAL LOW (ref 4.0–10.5)
nRBC: 0 % (ref 0.0–0.2)

## 2019-10-15 LAB — CBG MONITORING, ED: Glucose-Capillary: 101 mg/dL — ABNORMAL HIGH (ref 70–99)

## 2019-10-15 NOTE — ED Notes (Signed)
Pt currently incontinent of urine due to deficits from recent stroke. Purewick applied.

## 2019-10-15 NOTE — ED Triage Notes (Signed)
Pt brought to ED via [facility] from [facility] after staff reported pt experienced a brief syncopal episode at 11:15am. Staff reports episode was a single brief event. Unknown how long pt was unconscious. Pt in rehab for recent stroke that resulted in right sided deficits. Pt is currently A&O x4; denies pain, dizziness, n/v.

## 2019-10-15 NOTE — ED Notes (Signed)
All appropriate discharge materials reviewed at length with patient. Time for questions provided. Pt has no other questions at this time and verbalizes understanding of all provided materials.  

## 2019-10-15 NOTE — ED Provider Notes (Addendum)
[facility] EMERGENCY DEPARTMENT Provider Note   CSN: [id number] Arrival date & time: 10/15/19  1250     History Chief Complaint  Patient presents with  . Loss of Consciousness    Bailey [name] is a 64 y.o. female.  64 year old female with prior medical history as detailed below presents for evaluation of transient syncopal event.  Patient was at rehab.  Apparently as she was changing positions from sitting to standing she had a very brief syncopal event.  She apparently was minimally responsive for at most several seconds.  She currently is without complaint.  She denies any specific inciting event other than being encouraged to stand by the rehab staff.  She denies associated chest pain, shortness of breath, nausea, vomiting, headache, vision change, focal weakness, or other complaint.  She is alert and oriented.  She is in no distress.  She is somewhat upset that she is at our facility today for evaluation.  The history is provided by medical records and the patient.  Loss of Consciousness Episode history:  Single Most recent episode:  Today Duration:  10 seconds Timing:  Constant Progression:  Resolved Chronicity:  New Context: standing up   Witnessed: yes   Relieved by:  Nothing Worsened by:  Nothing Ineffective treatments:  None tried Associated symptoms: no chest pain, no palpitations and no shortness of breath        Past Medical History:  Diagnosis Date  . Anxiety    "Breakdown" in the 1990's, hosp. for >1 wk. at [facility]    . Anxiety   . Arthritis    OA- R knee, L hip   . Asthma    years ago. "no problems in a long while."  . Constipation   . Depression   . GERD (gastroesophageal reflux disease)   . Goiter    removed  at age 64  . Hypertension    all BP meds. managed by Dr. Eula Listen    . Hypertension   . Incontinence of urine    weak bladder  . Pneumonia    while at [facility].- 1990's told that she had pneumonia & was  isolated   . Stroke (HCC)    2005, R sided weakness   . Stroke [facility]) 2005   ? weakness in hand and arms per mother. patient unsure    Patient Active Problem List   Diagnosis Date Noted  . Urticaria 10/01/2019  . Wheezing 10/01/2019  . Hyperlipidemia 09/26/2019  . Hypomagnesemia 09/17/2019  . 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 Depression 09/15/2019  . 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 Mild cognitive impairment 03/11/2017  . Syncopal episodes 05/16/2015  . Acute renal injury (HCC) 05/16/2015  . Elevated troponin 05/16/2015  . Hypertension 05/16/2015  . Non compliance w medication regimen 05/16/2015  . Pulmonary nodule 05/16/2015  . Pressure ulcer 02/28/2015  . Dehydration 02/26/2015  . Intractable vomiting with nausea 02/26/2015  . Hypokalemia 02/25/2015  . Abdominal pain   . Primary osteoarthritis of right hip 02/15/2015  . Severe recurrent major depressive disorder with psychotic features (HCC) 09/01/2014  . GAD (generalized anxiety disorder) 09/01/2014  . PTSD (post-traumatic stress disorder) 09/01/2014  . Hip arthritis left 12/23/2011  . 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 ABDOMINAL HYSTERECTOMY    . COLONOSCOPY    . DILATION AND CURETTAGE OF UTERUS     1980's  . goiter  removed age 81  . JOINT REPLACEMENT  10/2011   Rt knee  . JOINT REPLACEMENT Right    knee  . THYROIDECTOMY, PARTIAL    . TONSILLECTOMY    . TOTAL HIP ARTHROPLASTY  12/23/2011   Procedure: TOTAL HIP ARTHROPLASTY;  Surgeon: Nestor Lewandowsky, MD;  Location: [facility] OR;  Service: Orthopedics;  Laterality: Left;  DEPUY/PINNACLE CUPS/SROM STEM  . TOTAL HIP ARTHROPLASTY Left   . TOTAL HIP ARTHROPLASTY Right 02/15/2015   Procedure: TOTAL HIP ARTHROPLASTY;  Surgeon: Gean Birchwood, MD;  Location: [facility] OR;  Service: Orthopedics;  Laterality: Right;  . TOTAL KNEE ARTHROPLASTY  10/21/2011   Procedure: TOTAL KNEE ARTHROPLASTY;  Surgeon: Nestor Lewandowsky, MD;  Location: [facility] OR;  Service: Orthopedics;  Laterality: Right;  DEPUY SIGMA RP  . TUBAL LIGATION       OB History    Gravida  0   Para  0   Term  0   Preterm  0   AB  0   Living        SAB  0   TAB  0   Ectopic  0   Multiple      Live Births              Family History  Problem Relation Age of Onset  . Anesthesia problems Neg Hx   . Hypotension Neg Hx   . Malignant hyperthermia Neg Hx   . Pseudochol deficiency Neg Hx   . Alcohol abuse Neg Hx   . Anxiety disorder Neg Hx   . Bipolar disorder Neg Hx   . Depression Neg Hx   . Dementia Neg Hx   . Breast cancer Neg Hx     Social History   Tobacco Use  . Smoking status: Never Smoker  . Smokeless tobacco: Never Used  Substance Use Topics  . Alcohol use: Yes    Comment: 2x's /month, socially    . Drug use: No    Types: Marijuana    Comment: irregular use, sometimes 2 x's week, then not for another month , last use November 2015    Home Medications Prior to Admission medications   Medication Sig Start Date End Date Taking? Authorizing Provider  acetaminophen (TYLENOL) 325 MG tablet Take 2 tablets (650 mg total) by mouth every 6 (six) hours as needed for mild pain (or Fever >/= 101). 05/18/15   Esperanza Sheets, MD  amLODipine (NORVASC) 10 MG tablet Take 10 mg by mouth daily. For HTN    [provider]  aspirin EC 325 MG tablet Take 1 tablet (325 mg total) by mouth 2 (two) times daily. 02/17/15   Allena Katz, PA-C  cetirizine (ZYRTEC) 10 MG tablet Take 10 mg by mouth daily.    [provider]  Cholecalciferol (VITAMIN D) 2000 UNITS tablet Take 2,000 Units by mouth daily.    [provider]  citalopram (CELEXA) 40 MG tablet Take 40 mg by mouth daily.     [provider]  clonazePAM (KLONOPIN) 0.5 MG tablet Take 0.5 mg by mouth 3 (three) times daily. For Anxiety    [provider]  cloNIDine (CATAPRES) 0.2 MG tablet Take 1 tablet (0.2 mg total) by  mouth 2 (two) times daily. 09/17/19   Levie Heritage, DO  clopidogrel (PLAVIX) 75 MG tablet Take 1 tablet (75 mg total) by mouth daily. 09/18/19   Levie Heritage, DO  Cyanocobalamin (VITAMIN B 12 PO) Take 1,000 mg by mouth  daily.     [provider]  diphenhydrAMINE (BENADRYL) 25 MG tablet Take 25 mg by mouth every 6 (six) hours as needed for itching.    [provider]  doxazosin (CARDURA) 8 MG tablet Take 8 mg by mouth at bedtime.    [provider]  Flaxseed, Linseed, (FLAX SEED OIL PO) Take 1,000 mg by mouth daily.     [provider]  gabapentin (NEURONTIN) 300 MG capsule Take 300 mg by mouth 3 (three) times daily.    [provider]  ipratropium-albuterol (DUONEB) 0.5-2.5 (3) MG/3ML SOLN Take 3 mLs by nebulization every 6 (six) hours.    [provider]  losartan (COZAAR) 100 MG tablet Take 100 mg by mouth daily.    [provider]  metoprolol tartrate (LOPRESSOR) 25 MG tablet Take 0.5 tablets (12.5 mg total) by mouth 2 (two) times daily. 09/17/19   Levie Heritage, DO  montelukast (SINGULAIR) 10 MG tablet Take 10 mg by mouth at bedtime.    [provider]  neomycin-bacitracin-polymyxin (NEOSPORIN) [phone] ointment Apply 1 application topically every 6 (six) hours as needed (Itching).    [provider]  omeprazole (PRILOSEC) 20 MG capsule Take 40 mg by mouth daily.    [provider]  potassium chloride SA (KLOR-CON) 20 MEQ tablet Take 20 mEq by mouth 2 (two) times daily.    [provider]  QUEtiapine (SEROQUEL) 50 MG tablet Take 1 tablet (50 mg total) by mouth at bedtime. 09/01/14 09/14/28  Oletta Darter, MD  rosuvastatin (CRESTOR) 20 MG tablet Take 1 tablet (20 mg total) by mouth daily at 6 PM. 09/17/19   Levie Heritage, DO  triamcinolone cream (KENALOG) 0.1 % Apply 1 application topically daily as needed (Itching).    [provider]  vitamin C (ASCORBIC ACID) 500 MG  tablet Take 500 mg by mouth daily.    [provider]    Allergies    Codeine, Sulfa antibiotics, and Sulfa drugs cross reactors  Review of Systems   Review of Systems  Respiratory: Negative for shortness of breath.   Cardiovascular: Positive for syncope. Negative for chest pain and palpitations.  All other systems reviewed and are negative.   Physical Exam Updated Vital Signs BP 121/82   Pulse 64   Temp 98.2 F (36.8 C) (Oral)   Resp 17   Ht 5\' 2"  (1.575 m)   Wt 81.6 kg   SpO2 96%   BMI 32.92 kg/m   Physical Exam Vitals and nursing note reviewed.  Constitutional:      General: She is not in acute distress.    Appearance: Normal appearance. She is well-developed.  HENT:     Head: Normocephalic and atraumatic.  Eyes:     Conjunctiva/sclera: Conjunctivae normal.     Pupils: Pupils are equal, round, and reactive to light.  Cardiovascular:     Rate and Rhythm: Normal rate and regular rhythm.     Heart sounds: Normal heart sounds.  Pulmonary:     Effort: Pulmonary effort is normal. No respiratory distress.     Breath sounds: Normal breath sounds.  Abdominal:     General: There is no distension.     Palpations: Abdomen is soft.     Tenderness: There is no abdominal tenderness.  Musculoskeletal:        General: No deformity. Normal range of motion.     Cervical back: Normal range of motion and neck supple.  Skin:    General:  Skin is warm and dry.  Neurological:     General: No focal deficit present.     Mental Status: She is alert and oriented to person, place, and time. Mental status is at baseline.     Cranial Nerves: No cranial nerve deficit.     Sensory: No sensory deficit.     Motor: No weakness.     Coordination: Coordination normal.     ED Results / Procedures / Treatments   Labs (all labs ordered are listed, but only abnormal results are displayed) Labs Reviewed  CBG MONITORING, ED - Abnormal; Notable for the following components:       Result Value   Glucose-Capillary 101 (*)    All other components within normal limits  COMPREHENSIVE METABOLIC PANEL  CBC WITH DIFFERENTIAL/PLATELET  URINALYSIS, ROUTINE W REFLEX MICROSCOPIC    EKG EKG Interpretation  Date/Time:  Friday October 15 2019 12:56:17 EST Ventricular Rate:  66 PR Interval:    QRS Duration: 97 QT Interval:  426 QTC Calculation: 447 R Axis:   -30 Text Interpretation: Sinus rhythm Left ventricular hypertrophy Confirmed by Dene Gentry [phone]) on 10/15/2019 1:03:47 PM   Radiology DG Chest Port 1 View  Result Date: 10/15/2019 CLINICAL DATA:  Syncope EXAM: PORTABLE CHEST 1 VIEW COMPARISON:  09/15/2011 FINDINGS: Normal mediastinum and cardiac silhouette. Normal pulmonary vasculature. No evidence of effusion, infiltrate, or pneumothorax. No acute bony abnormality. IMPRESSION: No acute cardiopulmonary process. Electronically Signed   By: Suzy Bouchard M.D.   On: 10/15/2019 13:26    Procedures Procedures (including critical care time)  Medications Ordered in ED Medications - No data to display  ED Course  I have reviewed the triage vital signs and the nursing notes.  Pertinent labs & imaging results that were available during my care of the patient were reviewed by me and considered in my medical decision making (see chart for details).     MDM Rules/Calculators/A&P                      MDM  Screen complete  Bailey [name] was evaluated in Emergency Department on 10/15/2019 for the symptoms described in the history of present illness. She was evaluated in the context of the global COVID-19 pandemic, which necessitated consideration that the patient might be at risk for infection with the SARS-CoV-2 virus that causes COVID-19. Institutional protocols and algorithms that pertain to the evaluation of patients at risk for COVID-19 are in a state of rapid change based on information released by regulatory bodies including the CDC and federal and state  organizations. These policies and algorithms were followed during the patient's care in the ED.  Patient is presenting for evaluation of reported syncopal events.  Patient's reported symptoms are very brief and associated with orthostatic change.  She is asymptomatic on arrival.  She is neurologically intact.  She is agreeable to having some initial work-up performed.    She otherwise desires discharge.  She is adamant that she does not want to be at the hospital today and does not want to be admitted.  Screening labs are without evidence of abnormality.  Patient appears to be appropriate for discharge.    Final Clinical Impression(s) / ED Diagnoses Final diagnoses:  Syncope, unspecified syncope type    Rx / DC Orders ED Discharge Orders    None       Valarie Merino, MD 10/15/19 1435    Valarie Merino, MD 10/15/19 1510

## 2019-10-15 NOTE — Progress Notes (Signed)
Location:    [name] Room Number: 102/P Place of Service:  SNF [phone]) Provider:  Alphonsa Overall, MD  Patient Care Team: Georgann Housekeeper, MD as PCP - General (Internal Medicine) Georgann Housekeeper, MD (Internal Medicine)  Extended Emergency Contact Information Primary Emergency Contact: Green,Margie Address: [address]          [place], [place] [address] D[place] of [place] Home Phone: [phone] Mobile Phone: [phone] Relation: Mother Secondary Emergency Contact: Evern Bio [place] of [place] Home Phone: [phone] Relation: Sister  Code Status:  Full Code Goals of care: Advanced Directive information Advanced Directives 10/15/2019  Does Patient Have a Medical Advance Directive? Yes  Type of Advance Directive (No Data)  Does patient want to make changes to medical advance directive? No - Patient declined  Copy of Healthcare Power of Attorney in Chart? -  Would patient like information on creating a medical advance directive? -  Pre-existing out of facility DNR order (yellow form or pink MOST form) -     Chief Complaint  Patient presents with  . Acute Visit    Unresponsive  Chief complaint acute visit secondary to unresponsive episode  HPI:  Pt is a 64 y.o. female seen today for an acute visit for an unresponsive episode. 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  She also has a history of hypertension as well as anxiety depression and a history of prior CVAs.  Apparently she was working with therapy this morning at bedside and had been doing well per therapy however she had sudden onset where she said she felt increasingly dizzy and then became unresponsive.  After some time--after lying down-- she did start to respond and appeared to gain strength although continued to be lethargic.  Vital signs showed a blood  pressure 137/97 oxygen saturation was near 100 pulse was in the 60s  she was afebrile.  She gradually came around and was talking and more alert.  By the time EMS arrived  She is not really complaining of chest pain or shortness of breath or dizziness currently.  She does continue on Plavix and aspirin with history of CVA she is on aspirin 325 mg twice daily.  She also is on numerous blood sugar medications with a history of hypertension including Lopressor 12.5 mg twice daily Cozaar 100 mg a day Cardura 8 mg nightly--clonidine 0.2 mg twice daily and Norvasc which was increased to 10 mg a day.        Past Medical History:  Diagnosis Date  . Anxiety    "Breakdown" in the 1990's, hosp. for >1 wk. at [facility]    . Anxiety   . Arthritis    OA- R knee, L hip   . Asthma    years ago. "no problems in a long while."  . Constipation   . Depression   . GERD (gastroesophageal reflux disease)   . Goiter    removed  at age 52  . Hypertension    all BP meds. managed by Dr. Eula Listen    . Hypertension   . Incontinence of urine    weak bladder  . Pneumonia    while at [facility].- 1990's told that she had pneumonia & was isolated   . Stroke (HCC)    2005, R sided weakness   . Stroke [facility]) 2005   ? weakness in hand and arms  per mother. patient unsure   Past Surgical History:  Procedure Laterality Date  . ABDOMINAL HYSTERECTOMY    . COLONOSCOPY    . DILATION AND CURETTAGE OF UTERUS     1980's  . goiter     removed age 51  . JOINT REPLACEMENT  10/2011   Rt knee  . JOINT REPLACEMENT Right    knee  . THYROIDECTOMY, PARTIAL    . TONSILLECTOMY    . TOTAL HIP ARTHROPLASTY  12/23/2011   Procedure: TOTAL HIP ARTHROPLASTY;  Surgeon: Kerin Salen, MD;  Location: [place];  Service: Orthopedics;  Laterality: Left;  DEPUY/PINNACLE CUPS/SROM STEM  . TOTAL HIP ARTHROPLASTY Left   . TOTAL HIP ARTHROPLASTY Right 02/15/2015   Procedure: TOTAL HIP ARTHROPLASTY;  Surgeon: Frederik Pear, MD;  Location:  [place];  Service: Orthopedics;  Laterality: Right;  . TOTAL KNEE ARTHROPLASTY  10/21/2011   Procedure: TOTAL KNEE ARTHROPLASTY;  Surgeon: Kerin Salen, MD;  Location: [place];  Service: Orthopedics;  Laterality: Right;  DEPUY SIGMA RP  . TUBAL LIGATION      Allergies  Allergen Reactions  . Codeine Nausea Only and Other (See Comments)    headaches  . Sulfa Antibiotics Nausea Only    Upset stomach, headaches  . Sulfa Drugs Cross Reactors Other (See Comments)    Its been a long time ago    Outpatient Encounter Medications as of 10/15/2019  Medication Sig  . acetaminophen (TYLENOL) 325 MG tablet Take 2 tablets (650 mg total) by mouth every 6 (six) hours as needed for mild pain (or Fever >/= 101).  Marland Kitchen amLODipine (NORVASC) 10 MG tablet Take 10 mg by mouth daily. For HTN  . aspirin EC 325 MG tablet Take 1 tablet (325 mg total) by mouth 2 (two) times daily.  . cetirizine (ZYRTEC) 10 MG tablet Take 10 mg by mouth daily.  . Cholecalciferol (VITAMIN D) 2000 UNITS tablet Take 2,000 Units by mouth daily.  . citalopram (CELEXA) 40 MG tablet Take 40 mg by mouth daily.   . clonazePAM (KLONOPIN) 0.5 MG tablet Take 0.5 mg by mouth 3 (three) times daily. For Anxiety  . cloNIDine (CATAPRES) 0.2 MG tablet Take 1 tablet (0.2 mg total) by mouth 2 (two) times daily.  . clopidogrel (PLAVIX) 75 MG tablet Take 1 tablet (75 mg total) by mouth daily.  . Cyanocobalamin (VITAMIN B 12 PO) Take 1,000 mg by mouth daily.   . diphenhydrAMINE (BENADRYL) 25 MG tablet Take 25 mg by mouth every 6 (six) hours as needed for itching.  Marland Kitchen doxazosin (CARDURA) 8 MG tablet Take 8 mg by mouth at bedtime.  . Flaxseed, Linseed, (FLAX SEED OIL PO) Take 1,000 mg by mouth daily.   Marland Kitchen gabapentin (NEURONTIN) 300 MG capsule Take 300 mg by mouth 3 (three) times daily.  Marland Kitchen ipratropium-albuterol (DUONEB) 0.5-2.5 (3) MG/3ML SOLN Take 3 mLs by nebulization every 6 (six) hours.  Marland Kitchen losartan (COZAAR) 100 MG tablet Take 100 mg by mouth daily.  .  metoprolol tartrate (LOPRESSOR) 25 MG tablet Take 0.5 tablets (12.5 mg total) by mouth 2 (two) times daily.  . montelukast (SINGULAIR) 10 MG tablet Take 10 mg by mouth at bedtime.  Marland Kitchen neomycin-bacitracin-polymyxin (NEOSPORIN) [id number] ointment Apply 1 application topically every 6 (six) hours as needed (Itching).  Marland Kitchen omeprazole (PRILOSEC) 20 MG capsule Take 40 mg by mouth daily.  . potassium chloride SA (KLOR-CON) 20 MEQ tablet Take 20 mEq by mouth 2 (two) times daily.  . QUEtiapine (SEROQUEL) 50 MG  tablet Take 1 tablet (50 mg total) by mouth at bedtime.  . rosuvastatin (CRESTOR) 20 MG tablet Take 1 tablet (20 mg total) by mouth daily at 6 PM.  . triamcinolone cream (KENALOG) 0.1 % Apply 1 application topically daily as needed (Itching).  . vitamin C (ASCORBIC ACID) 500 MG tablet Take 500 mg by mouth daily.   No facility-administered encounter medications on file as of 10/15/2019.    Review of Systems   In general she is not complaining of fever chills currently.  Skin does not complain of diaphoresis and has some history of itching was on some antihistamines and this appears to have helped as well as creams.  Head ears eyes nose mouth and throat is not really complaining of visual changes or sore throat or difficulty swallowing.  Respiratory does not complain of shortness of breath or cough.  Cardiac does not complain of chest pain.  GI is not complaining of feeling nauseous or any vomiting or abdominal pain complaints.  GU is not complaining of dysuria.  Musculoskeletal does have baseline right-sided weakness does not complain of acute pain however when I did do invasive maneuvers to try to get her to respond she said it hurt although this appears to be nonspecific  Neurologic again was unresponsive gradually responded but was lethargic this morning.  bytthtime by the time EMS arrived she appeared to be more at her baseline  Psych-she appears to be at baseline did not appear to be  overtly anxious or complaining of being depressed she does have some history of anxiety  Immunization History  Administered Date(s) Administered  . Influenza-Unspecified 05/31/2014, 07/01/2019  . PPD Test 03/01/2015   Pertinent  Health Maintenance Due  Topic Date Due  . PAP SMEAR-Modifier  04/28/1975  . COLONOSCOPY  04/27/2004  . DEXA SCAN  04/28/2019  . PNA vac Low Risk Adult (1 of 2 - PCV13) 04/28/2019  . MAMMOGRAM  05/26/2021  . INFLUENZA VACCINE  Completed   Fall Risk  03/11/2017  Falls in the past year? Yes  Number falls in past yr: 1  Injury with Fall? No   Functional Status Survey:   She is afebrile pulse is 68 respirations of 18 blood pressure 137/97 O2 saturation is near 100% on room air  Body mass index is 41.15 kg/m. Physical Exam   In general this is a somewhat obese female initially when I entered the room she was minimally responsive but eventually did open her eyes and after some time began to speak.  She did not appear to be distressed.  Again by the time EMS arrived she appeared to be more at her baseline was alert.  Her skin is warm and dry.  Eyes visual acuity appears to be intact sclera and conjunctive are clear.  Oropharynx appears to be relatively clear.  Chest was clear to auscultation with shallow air entry there is no labored breathing.  Heart was largely regular rate and rhythm she does have some right-sided edema lower extremity which is the side affected by her CVA.  Abdomen is obese soft appears to be somewhat tender but this may be more reaction to the invasive maneuver bowel sounds are active.  Musculoskeletal does have right-sided weakness does have some movement of her right side however this does not appear to be grossly changed from baseline.  Neurologic as noted above positive for right-sided weakness gradually she became more alert her speech was  slurred but this is not really a new presentation.  Psych initially was a bit  agitated with exam but gradually returned more to her baseline pleasant and cooperative.    Labs reviewed:  October 14 2019.  WBC 3.9 hemoglobin 13.2 platelets 312.   September 20, 2019.  WBC 5.2 hemoglobin 12.1 platelets 235.  Sodium 144 potassium 3.6 BUN 12.6 creatinine 0.71 Recent Labs    09/15/19 0636 09/15/19 0636 09/16/19 0249 09/17/19 0254 09/20/19 0000  NA 146*   < > 145 143 144  K 4.0   < > 3.3* 3.5 3.6  CL 107   < > 106 106 104  CO2 30   < > 28 29 27*  GLUCOSE 96  --  113* 100*  --   BUN 13   < > 12 10 13   CREATININE 0.91   < > 0.86 0.79 0.7  CALCIUM 8.8*   < > 8.8* 8.7* 8.4*  MG 1.6*  --   --   --   --    < > = values in this interval not displayed.   Recent Labs    09/14/19 1126  AST 17  ALT 15  ALKPHOS 95  BILITOT 1.2  PROT 6.8  ALBUMIN 3.5   Recent Labs    09/14/19 1126 09/20/19 0000  WBC 3.9* 5.2  NEUTROABS 2.2 3  HGB 12.6 12.1  HCT 35.5* 34*  MCV 82.6  --   PLT 266 235   Lab Results  Component Value Date   TSH 0.174 (L) 05/17/2015   Lab Results  Component Value Date   HGBA1C 5.2 09/15/2019   Lab Results  Component Value Date   CHOL 175 09/15/2019   HDL 53 09/15/2019   LDLCALC 114 (H) 09/15/2019   TRIG 42 09/15/2019   CHOLHDL 3.3 09/15/2019    Significant Diagnostic Results in last 30 days:  No results found.  Assessment/Plan  #1 unresponsive episode with lethargy-before EMS arrived patient appeared to return close to her baseline but initially had a significant period of unresponsiveness followed by the lethargy-.  Will send to the ER for evaluation-it was reassuring that she gradually improved by the time EMS arrived appear to be close of her baseline.  Would like to rule out any possible cardiac or neurologic etiology however.  Blood pressure is also may be contributing to this.  09/17/2019

## 2019-10-15 NOTE — Discharge Instructions (Addendum)
Please return for any problem.  Follow-up with your regular care provider as instructed. °

## 2019-10-17 ENCOUNTER — Encounter: Payer: Self-pay | Admitting: Internal Medicine

## 2019-10-18 ENCOUNTER — Non-Acute Institutional Stay (SKILLED_NURSING_FACILITY): Payer: Medicare Other | Admitting: Internal Medicine

## 2019-10-18 ENCOUNTER — Encounter: Payer: Self-pay | Admitting: Internal Medicine

## 2019-10-18 DIAGNOSIS — R55 Syncope and collapse: Secondary | ICD-10-CM | POA: Diagnosis not present

## 2019-10-18 DIAGNOSIS — I639 Cerebral infarction, unspecified: Secondary | ICD-10-CM | POA: Diagnosis not present

## 2019-10-18 DIAGNOSIS — E876 Hypokalemia: Secondary | ICD-10-CM

## 2019-10-18 DIAGNOSIS — I10 Essential (primary) hypertension: Secondary | ICD-10-CM

## 2019-10-18 DIAGNOSIS — I6381 Other cerebral infarction due to occlusion or stenosis of small artery: Secondary | ICD-10-CM

## 2019-10-18 NOTE — Progress Notes (Signed)
Location:    [name] Room Number: 507/P Place of Service:  SNF [phone]) Provider:  Alphonsa Overall, MD  Patient Care Team: Georgann Housekeeper, MD as PCP - General (Internal Medicine) Georgann Housekeeper, MD (Internal Medicine)  Extended Emergency Contact Information Primary Emergency Contact: Green,Margie Address: [address]          [place], [place] [address] [place] of [place] Home Phone: [phone] Mobile Phone: [phone] Relation: Mother Secondary Emergency Contact: Evern Bio [place] Home Phone: [phone] Relation: Sister  Code Status:  Full Code  Goals of care: Advanced Directive information Advanced Directives 10/18/2019  Does Patient Have a Medical Advance Directive? Yes  Type of Advance Directive (No Data)  Does patient want to make changes to medical advance directive? No - Patient declined  Copy of Healthcare Power of Attorney in Chart? -  Would patient like information on creating a medical advance directive? -  Pre-existing out of facility DNR order (yellow form or pink MOST form) -     Chief Complaint  Patient presents with  . Follow-up    Follow Up ED   Follow-up of ER visit secondary to syncopal episode anemia  HPI:  Pt is a 64 y.o. female seen today for an acute visit for follow-up of ER visit last Friday secondary to a syncopal episode.  Patient apparently was working with therapy on the side of her bed Friday morning when she became unresponsive she was laid in the bed and after short period began to slowly respond and then eventually pretty much to her baseline cognitive status.  Her vital signs blood pressure at that time appeared to be stable.  However there was concern for possibly a cardiac or neurologic event with her history and she was sent to the ER.  Patient allowed a limited work-up in the ER secondary to saying that she felt fine.  EKG showed normal sinus rhythm-her  labs were unremarkable-chest x-Santoyo was done which did not show any acute process.  She has returned to the facility and her been no further syncopal episode she denies any dizziness blood pressures appear to be stable recently 137/83-117/75-140/86-125/77.  She is resting in bed comfortably and has no complaints today.  She continues on Norvasc 10 mg a day this was recently increased because of higher blood pressure readings-she is also on clonidine 0.2 mg twice daily-Cozaar 100 mg a day as well as Cardura 8 mg a day.  As well as Lopressor 12.5 mg twice daily     Past Medical History:  Diagnosis Date  . Anxiety    "Breakdown" in the 1990's, hosp. for >1 wk. at [facility]    . Anxiety   . Arthritis    OA- R knee, L hip   . Asthma    years ago. "no problems in a long while."  . Constipation   . Depression   . GERD (gastroesophageal reflux disease)   . Goiter    removed  at age 8  . Hypertension    all BP meds. managed by Dr. Eula Listen    . Hypertension   . Incontinence of urine    weak bladder  . Pneumonia    while at [facility].- 1990's told that she had pneumonia & was isolated   . Stroke (HCC)    2005, R sided weakness   . Stroke [facility]) 2005   ? weakness in hand and arms per mother. patient unsure  Past Surgical History:  Procedure Laterality Date  . ABDOMINAL HYSTERECTOMY    . COLONOSCOPY    . DILATION AND CURETTAGE OF UTERUS     1980's  . goiter     removed age 25  . JOINT REPLACEMENT  10/2011   Rt knee  . JOINT REPLACEMENT Right    knee  . THYROIDECTOMY, PARTIAL    . TONSILLECTOMY    . TOTAL HIP ARTHROPLASTY  12/23/2011   Procedure: TOTAL HIP ARTHROPLASTY;  Surgeon: Kerin Salen, MD;  Location: [place];  Service: Orthopedics;  Laterality: Left;  DEPUY/PINNACLE CUPS/SROM STEM  . TOTAL HIP ARTHROPLASTY Left   . TOTAL HIP ARTHROPLASTY Right 02/15/2015   Procedure: TOTAL HIP ARTHROPLASTY;  Surgeon: Frederik Pear, MD;  Location: [place];  Service: Orthopedics;  Laterality:  Right;  . TOTAL KNEE ARTHROPLASTY  10/21/2011   Procedure: TOTAL KNEE ARTHROPLASTY;  Surgeon: Kerin Salen, MD;  Location: [place];  Service: Orthopedics;  Laterality: Right;  DEPUY SIGMA RP  . TUBAL LIGATION      Allergies  Allergen Reactions  . Codeine Nausea Only and Other (See Comments)    headaches  . Sulfa Antibiotics Nausea Only    Upset stomach, headaches  . Sulfa Drugs Cross Reactors Other (See Comments)    Its been a long time ago    Outpatient Encounter Medications as of 10/18/2019  Medication Sig  . acetaminophen (TYLENOL) 325 MG tablet Take 2 tablets (650 mg total) by mouth every 6 (six) hours as needed for mild pain (or Fever >/= 101).  Marland Kitchen amLODipine (NORVASC) 10 MG tablet Take 10 mg by mouth daily. For HTN  . aspirin EC 325 MG tablet Take 1 tablet (325 mg total) by mouth 2 (two) times daily.  . cetirizine (ZYRTEC) 10 MG tablet Take 10 mg by mouth daily.  . Cholecalciferol (VITAMIN D) 2000 UNITS tablet Take 2,000 Units by mouth daily.  . citalopram (CELEXA) 40 MG tablet Take 40 mg by mouth daily.   . clonazePAM (KLONOPIN) 0.5 MG tablet Take 0.5 mg by mouth 3 (three) times daily. For Anxiety  . cloNIDine (CATAPRES) 0.2 MG tablet Take 1 tablet (0.2 mg total) by mouth 2 (two) times daily.  . clopidogrel (PLAVIX) 75 MG tablet Take 1 tablet (75 mg total) by mouth daily.  . Cyanocobalamin (VITAMIN B 12 PO) Take 1,000 mg by mouth daily.   . diphenhydrAMINE (BENADRYL) 25 MG tablet Take 25 mg by mouth every 6 (six) hours as needed for itching.  Marland Kitchen doxazosin (CARDURA) 8 MG tablet Take 8 mg by mouth at bedtime.  . Flaxseed, Linseed, (FLAX SEED OIL PO) Take 1,000 mg by mouth daily.   Marland Kitchen gabapentin (NEURONTIN) 300 MG capsule Take 300 mg by mouth 3 (three) times daily.  Marland Kitchen ipratropium-albuterol (DUONEB) 0.5-2.5 (3) MG/3ML SOLN Take 3 mLs by nebulization every 6 (six) hours.  Marland Kitchen losartan (COZAAR) 100 MG tablet Take 100 mg by mouth daily.  . metoprolol tartrate (LOPRESSOR) 25 MG tablet Take  0.5 tablets (12.5 mg total) by mouth 2 (two) times daily.  . montelukast (SINGULAIR) 10 MG tablet Take 10 mg by mouth at bedtime.  Marland Kitchen neomycin-bacitracin-polymyxin (NEOSPORIN) [phone] ointment Apply 1 application topically every 6 (six) hours as needed (Itching).  . NON FORMULARY Diet: Reg, NAS Heart Healthy  . omeprazole (PRILOSEC) 20 MG capsule Take 40 mg by mouth daily.  . potassium chloride SA (KLOR-CON) 20 MEQ tablet Take 20 mEq by mouth 2 (two) times daily.  . QUEtiapine (  SEROQUEL) 50 MG tablet Take 1 tablet (50 mg total) by mouth at bedtime.  . rosuvastatin (CRESTOR) 20 MG tablet Take 1 tablet (20 mg total) by mouth daily at 6 PM.  . triamcinolone cream (KENALOG) 0.1 % Apply 1 application topically daily as needed (Itching).  . vitamin C (ASCORBIC ACID) 500 MG tablet Take 500 mg by mouth daily.   No facility-administered encounter medications on file as of 10/18/2019.    Review of Systems   In general she says she feels well does not complain of any fever or chills.  Skin no complaints of diaphoresis rashes or itching.  Head ears eyes nose mouth and throat does not complain of visual changes or sore throat.  Respiratory no complaints of shortness of breath or cough.  Cardiac does not complain of chest pain has baseline lower extremity edema.  GI does not complain of abdominal discomfort nausea vomiting diarrhea constipation.  GU no complaints of dysuria.  Musculoskeletal does have history of right-sided weakness status post CVA-is not complaining of pain at this point  Neurologic as noted above she does not complain of dizziness or headache.  Psych does not complain of being overtly depressed or anxious appears to be in very good spirits.    Immunization History  Administered Date(s) Administered  . Influenza-Unspecified 05/31/2014, 07/01/2019  . PPD Test 03/01/2015   Pertinent  Health Maintenance Due  Topic Date Due  . PAP SMEAR-Modifier  04/28/1975  .  COLONOSCOPY  04/27/2004  . DEXA SCAN  04/28/2019  . PNA vac Low Risk Adult (1 of 2 - PCV13) 04/28/2019  . MAMMOGRAM  05/26/2021  . INFLUENZA VACCINE  Completed   Fall Risk  03/11/2017  Falls in the past year? Yes  Number falls in past yr: 1  Injury with Fall? No   Functional Status Survey:    Vitals:   10/18/19 1318  BP: 137/83  Pulse: 69  Resp: 19  Temp: (!) 97.5 F (36.4 C)  TempSrc: Oral  SpO2: 92%  Weight: 218 lb 1.6 oz (98.9 kg)  Height: 5\' 1"  (1.549 m)   Body mass index is 41.21 kg/m. Physical Exam In general this is a pleasant elderly female in no distress lying comfortably in bed she appears to be in very good spirits.  Her skin is warm and dry.  Eyes visual acuity appears grossly intact her speech is clear.  Oropharynx is clear mucous membranes moist.  Chest is clear to auscultation there is no labored breathing air entry is somewhat shallow.  Heart is regular rate and rhythm without murmur gallop or rub she has some right-sided edema which is the side affected by the CVA.  Abdomen is soft obese nontender with positive bowel sounds.  Musculoskeletal does have right-sided weakness at baseline with some edema moves her left sided extremities at baseline.  Neurologic as noted she does have right-sided weakness this appears baseline and slightly slurred speech.  Psych she is alert and oriented pleasant and appropriate appears to be in good spirits.  Along the joint Labs reviewed: Recent Labs    09/15/19 0636 09/15/19 0636 09/16/19 0249 09/16/19 0249 09/17/19 0254 09/20/19 0000 10/15/19 1345  NA 146*   < > 145   < > 143 144 144  K 4.0   < > 3.3*   < > 3.5 3.6 3.7  CL 107   < > 106   < > 106 104 106  CO2 30   < > 28   < >  29 27* 29  GLUCOSE 96   < > 113*  --  100*  --  95  BUN 13   < > 12   < > 10 13 13   CREATININE 0.91   < > 0.86   < > 0.79 0.7 0.78  CALCIUM 8.8*   < > 8.8*   < > 8.7* 8.4* 8.7*  MG 1.6*  --   --   --   --   --   --    < > =  values in this interval not displayed.   Recent Labs    09/14/19 1126 10/15/19 1345  AST 17 28  ALT 15 38  ALKPHOS 95 89  BILITOT 1.2 0.6  PROT 6.8 6.2*  ALBUMIN 3.5 3.0*   Recent Labs    09/14/19 1126 09/20/19 0000 10/15/19 1345  WBC 3.9* 5.2 3.6*  NEUTROABS 2.2 3 1.3*  HGB 12.6 12.1 12.0  HCT 35.5* 34* 34.2*  MCV 82.6  --  81.2  PLT 266 235 281   Lab Results  Component Value Date   TSH 0.174 (L) 05/17/2015   Lab Results  Component Value Date   HGBA1C 5.2 09/15/2019   Lab Results  Component Value Date   CHOL 175 09/15/2019   HDL 53 09/15/2019   LDLCALC 114 (H) 09/15/2019   TRIG 42 09/15/2019   CHOLHDL 3.3 09/15/2019    Significant Diagnostic Results in last 30 days:  DG Chest Port 1 View  Result Date: 10/15/2019 CLINICAL DATA:  Syncope EXAM: PORTABLE CHEST 1 VIEW COMPARISON:  09/15/2011 FINDINGS: Normal mediastinum and cardiac silhouette. Normal pulmonary vasculature. No evidence of effusion, infiltrate, or pneumothorax. No acute bony abnormality. IMPRESSION: No acute cardiopulmonary process. Electronically Signed   By: 09/17/2011 M.D.   On: 10/15/2019 13:26    Assessment/Plan  #1 syncopal episode-this is thought to be most likely a transitory episode possibly secondary to blood pressure drop.  At this point will monitor  blood pressures--so far appear to be stable-her Norvasc was recently increased because of higher readings sometimes in the 160s and this appears to have improved.  I do not really see any recent lower readings but will have to keep an eye on this.  There have been no further episodes at this point continue to monitor.  2.  History of hypokalemia she is on potassium supplementation 20 mEq twice daily potassium was within normal limits on hospital lab at 3.7 actually on lab before  the day before her potassium was 3.5   #3 history of CVA with right-sided weakness she continues on Plavix as well as aspirin 325 mg twice daily at this  point continue supportive care apparently she is making some progress with therapy  [phone]

## 2019-10-19 ENCOUNTER — Encounter: Payer: Self-pay | Admitting: Internal Medicine

## 2019-10-25 ENCOUNTER — Other Ambulatory Visit: Payer: Self-pay | Admitting: Internal Medicine

## 2019-10-25 MED ORDER — CLONAZEPAM 0.5 MG PO TABS: 0.5000 mg | ORAL_TABLET | Freq: Three times a day (TID) | ORAL | 0 refills | Status: DC

## 2019-10-26 ENCOUNTER — Other Ambulatory Visit: Payer: Self-pay | Admitting: Internal Medicine

## 2019-11-05 ENCOUNTER — Encounter: Payer: Self-pay | Admitting: Internal Medicine

## 2019-11-05 ENCOUNTER — Non-Acute Institutional Stay (SKILLED_NURSING_FACILITY): Payer: Medicare Other | Admitting: Internal Medicine

## 2019-11-05 DIAGNOSIS — I10 Essential (primary) hypertension: Secondary | ICD-10-CM | POA: Diagnosis not present

## 2019-11-05 DIAGNOSIS — F329 Major depressive disorder, single episode, unspecified: Secondary | ICD-10-CM

## 2019-11-05 DIAGNOSIS — F32A Depression, unspecified: Secondary | ICD-10-CM

## 2019-11-05 DIAGNOSIS — F411 Generalized anxiety disorder: Secondary | ICD-10-CM

## 2019-11-05 NOTE — Progress Notes (Signed)
Location:  [facility] Room Number: 201/D Place of Service:  SNF (31)  Georgann Housekeeper, MD  Patient Care Team: Georgann Housekeeper, MD as PCP - General (Internal Medicine) Georgann Housekeeper, MD (Internal Medicine)  Extended Emergency Contact Information Primary Emergency Contact: Green,Margie Address: [address]          [place], [place] [address] [place] Home Phone: [phone] Mobile Phone: [phone] Relation: Mother Secondary Emergency Contact: Richardson,Cynthia  [place] Home Phone: [phone] Relation: Sister    Allergies: Codeine, Sulfa antibiotics, and Sulfa drugs cross reactors  Chief Complaint  Patient presents with  . Medical Management of Chronic Issues    Routine visit of medical management    HPI: Patient is 64 y.o. female who is being seen for routine issues of hypertension, depression, and general anxiety disorder.  Past Medical History:  Diagnosis Date  . Anxiety    "Breakdown" in the 1990's, hosp. for >1 wk. at [facility]    . Anxiety   . Arthritis    OA- R knee, L hip   . Asthma    years ago. "no problems in a long while."  . Constipation   . Depression   . GERD (gastroesophageal reflux disease)   . Goiter    removed  at age 61  . Hypertension    all BP meds. managed by Dr. Eula Listen    . Hypertension   . Incontinence of urine    weak bladder  . Pneumonia    while at [facility].- 1990's told that she had pneumonia & was isolated   . Stroke (HCC)    2005, R sided weakness   . Stroke [facility]) 2005   ? weakness in hand and arms per mother. patient unsure    Past Surgical History:  Procedure Laterality Date  . ABDOMINAL HYSTERECTOMY    . COLONOSCOPY    . DILATION AND CURETTAGE OF UTERUS     1980's  . goiter     removed age 37  . JOINT REPLACEMENT  10/2011   Rt knee  . JOINT REPLACEMENT Right    knee  . THYROIDECTOMY, PARTIAL    . TONSILLECTOMY    . TOTAL HIP ARTHROPLASTY   12/23/2011   Procedure: TOTAL HIP ARTHROPLASTY;  Surgeon: Nestor Lewandowsky, MD;  Location: [facility] OR;  Service: Orthopedics;  Laterality: Left;  DEPUY/PINNACLE CUPS/SROM STEM  . TOTAL HIP ARTHROPLASTY Left   . TOTAL HIP ARTHROPLASTY Right 02/15/2015   Procedure: TOTAL HIP ARTHROPLASTY;  Surgeon: Gean Birchwood, MD;  Location: [facility] OR;  Service: Orthopedics;  Laterality: Right;  . TOTAL KNEE ARTHROPLASTY  10/21/2011   Procedure: TOTAL KNEE ARTHROPLASTY;  Surgeon: Nestor Lewandowsky, MD;  Location: [facility] OR;  Service: Orthopedics;  Laterality: Right;  DEPUY SIGMA RP  . TUBAL LIGATION      Allergies as of 11/05/2019      Reactions   Codeine Nausea Only, Other (See Comments)   headaches   Sulfa Antibiotics Nausea Only   Upset stomach, headaches   Sulfa Drugs Cross Reactors Other (See Comments)   Its been a long time ago      Medication List       Accurate as of November 05, 2019 11:59 PM. If you have any questions, ask your nurse or doctor.        acetaminophen 325 MG tablet Commonly known as: TYLENOL Take 2 tablets (650 mg total) by mouth every 6 (six) hours as needed  for mild pain (or Fever >/= 101).   amLODipine 10 MG tablet Commonly known as: NORVASC Take 10 mg by mouth daily. For HTN   aspirin EC 325 MG tablet Take 1 tablet (325 mg total) by mouth 2 (two) times daily.   cetirizine 10 MG tablet Commonly known as: ZYRTEC Take 10 mg by mouth daily.   citalopram 40 MG tablet Commonly known as: CELEXA Take 40 mg by mouth daily.   clonazePAM 0.5 MG tablet Commonly known as: KLONOPIN Take 1 tablet (0.5 mg total) by mouth 3 (three) times daily. For Anxiety   cloNIDine 0.2 MG tablet Commonly known as: CATAPRES Take 1 tablet (0.2 mg total) by mouth 2 (two) times daily.   clopidogrel 75 MG tablet Commonly known as: PLAVIX Take 1 tablet (75 mg total) by mouth daily.   diphenhydrAMINE 25 MG tablet Commonly known as: BENADRYL Take 25 mg by mouth every 6 (six) hours as needed for itching.     doxazosin 8 MG tablet Commonly known as: CARDURA Take 8 mg by mouth at bedtime.   FLAX SEED OIL PO Take 1,000 mg by mouth daily.   gabapentin 300 MG capsule Commonly known as: NEURONTIN Take 300 mg by mouth 3 (three) times daily.   ipratropium-albuterol 0.5-2.5 (3) MG/3ML Soln Commonly known as: DUONEB Take 3 mLs by nebulization every 6 (six) hours.   losartan 100 MG tablet Commonly known as: COZAAR Take 100 mg by mouth daily.   methocarbamol 500 MG tablet Commonly known as: ROBAXIN Take 500 mg by mouth every 8 (eight) hours as needed for muscle spasms.   metoprolol tartrate 25 MG tablet Commonly known as: LOPRESSOR Take 0.5 tablets (12.5 mg total) by mouth 2 (two) times daily.   montelukast 10 MG tablet Commonly known as: SINGULAIR Take 10 mg by mouth at bedtime.   neomycin-bacitracin-polymyxin [phone] ointment Apply 1 application topically every 6 (six) hours as needed (Itching).   NON FORMULARY Diet: Reg, NAS Heart Healthy   omeprazole 20 MG capsule Commonly known as: PRILOSEC Take 40 mg by mouth daily.   potassium chloride SA 20 MEQ tablet Commonly known as: KLOR-CON Take 20 mEq by mouth 2 (two) times daily.   QUEtiapine 50 MG tablet Commonly known as: SEROQUEL Take 1 tablet (50 mg total) by mouth at bedtime.   rosuvastatin 20 MG tablet Commonly known as: CRESTOR Take 1 tablet (20 mg total) by mouth daily at 6 PM.   triamcinolone cream 0.1 % Commonly known as: KENALOG Apply 1 application topically daily as needed (Itching).   VITAMIN B 12 PO Take 1,000 mg by mouth daily.   vitamin C 500 MG tablet Commonly known as: ASCORBIC ACID Take 500 mg by mouth daily.   Vitamin D 50 MCG (2000 UT) tablet Take 2,000 Units by mouth daily.       No orders of the defined types were placed in this encounter.   Immunization History  Administered Date(s) Administered  . Influenza-Unspecified 05/31/2014, 07/01/2019  . PPD Test 03/01/2015    Social  History   Tobacco Use  . Smoking status: Never Smoker  . Smokeless tobacco: Never Used  Substance Use Topics  . Alcohol use: Yes    Comment: 2x's /month, socially      Review of Systems   GENERAL:  no fevers, fatigue, appetite changes SKIN: No itching, rash HEENT: No complaint RESPIRATORY: No cough, wheezing, SOB CARDIAC: No chest pain, palpitations, lower extremity edema  GI: No abdominal pain, No N/V/D or constipation,  No heartburn or reflux  GU: No dysuria, frequency or urgency, or incontinence  MUSCULOSKELETAL: No unrelieved bone/joint pain NEUROLOGIC: No headache, dizziness  PSYCHIATRIC: No overt anxiety or sadness  Vitals:   11/05/19 1327  BP: 134/80  Pulse: 66  Resp: 18  Temp: 99.1 F (37.3 C)  SpO2: 95%   Body mass index is 41.59 kg/m. Physical Exam  GENERAL APPEARANCE: Alert, conversant, No acute distress  SKIN: No diaphoresis rash HEENT: Unremarkable RESPIRATORY: Breathing is even, unlabored. Lung sounds are clear   CARDIOVASCULAR: Heart RRR no murmurs, rubs or gallops. No peripheral edema  GASTROINTESTINAL: Abdomen is soft, non-tender, not distended w/ normal bowel sounds.  GENITOURINARY: Bladder non tender, not distended  MUSCULOSKELETAL: No abnormal joints or musculature NEUROLOGIC: Cranial nerves 2-12 grossly intact PSYCHIATRIC: Mood and affect appropriate to situation, no behavioral issues  Patient Active Problem List   Diagnosis Date Noted  . Urticaria 10/01/2019  . Wheezing 10/01/2019  . Hyperlipidemia 09/26/2019  . Hypomagnesemia 09/17/2019  . 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 Depression 09/15/2019  . 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 Mild cognitive impairment 03/11/2017  . Syncopal episodes 05/16/2015  . Acute renal injury (HCC) 05/16/2015  . Elevated troponin 05/16/2015  . Hypertension 05/16/2015  . Non compliance w medication regimen 05/16/2015  . Pulmonary nodule 05/16/2015  . Pressure ulcer 02/28/2015  . Dehydration 02/26/2015    . Intractable vomiting with nausea 02/26/2015  . Hypokalemia 02/25/2015  . Abdominal pain   . Primary osteoarthritis of right hip 02/15/2015  . Severe recurrent major depressive disorder with psychotic features (HCC) 09/01/2014  . GAD (generalized anxiety disorder) 09/01/2014  . PTSD (post-traumatic stress disorder) 09/01/2014  . Hip arthritis left 12/23/2011  . 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 10/21/2011    CMP     Component Value Date/Time   NA 144 10/15/2019 1345   NA 144 09/20/2019 0000   K 3.7 10/15/2019 1345   CL 106 10/15/2019 1345   CO2 29 10/15/2019 1345   GLUCOSE 95 10/15/2019 1345   BUN 13 10/15/2019 1345   BUN 13 09/20/2019 0000   CREATININE 0.78 10/15/2019 1345   CALCIUM 8.7 (L) 10/15/2019 1345   PROT 6.2 (L) 10/15/2019 1345   ALBUMIN 3.0 (L) 10/15/2019 1345   AST 28 10/15/2019 1345   ALT 38 10/15/2019 1345   ALKPHOS 89 10/15/2019 1345   BILITOT 0.6 10/15/2019 1345   GFRNONAA >60 10/15/2019 1345   GFRAA >60 10/15/2019 1345   Recent Labs    09/15/19 0636 09/15/19 0636 09/16/19 0249 09/16/19 0249 09/17/19 0254 09/20/19 0000 10/15/19 1345  NA 146*   < > 145   < > 143 144 144  K 4.0   < > 3.3*   < > 3.5 3.6 3.7  CL 107   < > 106   < > 106 104 106  CO2 30   < > 28   < > 29 27* 29  GLUCOSE 96   < > 113*  --  100*  --  95  BUN 13   < > 12   < > 10 13 13   CREATININE 0.91   < > 0.86   < > 0.79 0.7 0.78  CALCIUM 8.8*   < > 8.8*   < > 8.7* 8.4* 8.7*  MG 1.6*  --   --   --   --   --   --    < > = values in this interval not displayed.   Recent  Labs    09/14/19 1126 10/15/19 1345  AST 17 28  ALT 15 38  ALKPHOS 95 89  BILITOT 1.2 0.6  PROT 6.8 6.2*  ALBUMIN 3.5 3.0*   Recent Labs    09/14/19 1126 09/20/19 0000 10/15/19 1345  WBC 3.9* 5.2 3.6*  NEUTROABS 2.2 3 1.3*  HGB 12.6 12.1 12.0  HCT 35.5* 34* 34.2*  MCV 82.6  --  81.2  PLT 266 235 281   Recent Labs    09/15/19 0636  CHOL 175  LDLCALC 114*  TRIG 42   No results  found for: [facility] Lab Results  Component Value Date   TSH 0.174 (L) 05/17/2015   Lab Results  Component Value Date   HGBA1C 5.2 09/15/2019   Lab Results  Component Value Date   CHOL 175 09/15/2019   HDL 53 09/15/2019   LDLCALC 114 (H) 09/15/2019   TRIG 42 09/15/2019   CHOLHDL 3.3 09/15/2019    Significant Diagnostic Results in last 30 days:  DG Chest Port 1 View  Result Date: 10/15/2019 CLINICAL DATA:  Syncope EXAM: PORTABLE CHEST 1 VIEW COMPARISON:  09/15/2011 FINDINGS: Normal mediastinum and cardiac silhouette. Normal pulmonary vasculature. No evidence of effusion, infiltrate, or pneumothorax. No acute bony abnormality. IMPRESSION: No acute cardiopulmonary process. Electronically Signed   By: Genevive Bi M.D.   On: 10/15/2019 13:26    Assessment and Plan  Hypertension Controlled on multiple medications; continue Norvasc 10 mg daily, clonidine 0.2 mg twice daily, metoprolol 12.5 mg twice daily, Cardura 8 mg nightly, and losartan 100 mg daily  Depression Appears controlled; especially considering her for her chances; continue Celexa 40 mg daily  GAD (generalized anxiety disorder) Waxes and wanes; continue clonazepam 0.5 mg 3 times daily and Celexa 40 mg daily    Margit Hanks, MD

## 2019-11-08 ENCOUNTER — Encounter: Payer: Self-pay | Admitting: Internal Medicine

## 2019-11-08 NOTE — Progress Notes (Signed)
This encounter was created in error - please disregard.

## 2019-11-08 NOTE — Assessment & Plan Note (Signed)
Appears controlled; especially considering her for her chances; continue Celexa 40 mg daily

## 2019-11-08 NOTE — Progress Notes (Deleted)
Location:  [facility] Room Number: 201-D Place of Service:  SNF (31)  Georgann Housekeeper, MD  Patient Care Team: Georgann Housekeeper, MD as PCP - General (Internal Medicine) Georgann Housekeeper, MD (Internal Medicine)  Extended Emergency Contact Information Primary Emergency Contact: Green,Margie Address: [address]          [place], [place] [address] [place] of [place] Home Phone: [phone] Mobile Phone: [phone] Relation: Mother Secondary Emergency Contact: Richardson,Cynthia  [place] Home Phone: [phone] Relation: Sister    Allergies: Codeine, Sulfa antibiotics, and Sulfa drugs cross reactors  Chief Complaint  Patient presents with  . Medical Management of Chronic Issues    Routine [facility] visit    HPI: Patient is a 64 y.o. female who   Past Medical History:  Diagnosis Date  . Anxiety    "Breakdown" in the 1990's, hosp. for >1 wk. at [facility]    . Anxiety   . Arthritis    OA- R knee, L hip   . Asthma    years ago. "no problems in a long while."  . Constipation   . Depression   . GERD (gastroesophageal reflux disease)   . Goiter    removed  at age 89  . Hypertension    all BP meds. managed by Dr. Eula Listen    . Hypertension   . Incontinence of urine    weak bladder  . Pneumonia    while at [facility] 1990's told that she had pneumonia & was isolated   . Stroke (HCC)    2005, R sided weakness   . Stroke [facility]) 2005   ? weakness in hand and arms per mother. patient unsure    Past Surgical History:  Procedure Laterality Date  . ABDOMINAL HYSTERECTOMY    . COLONOSCOPY    . DILATION AND CURETTAGE OF UTERUS     1980's  . goiter     removed age 75  . JOINT REPLACEMENT  10/2011   Rt knee  . JOINT REPLACEMENT Right    knee  . THYROIDECTOMY, PARTIAL    . TONSILLECTOMY    . TOTAL HIP ARTHROPLASTY  12/23/2011   Procedure: TOTAL HIP ARTHROPLASTY;  Surgeon: Nestor Lewandowsky, MD;  Location: [facility] OR;   Service: Orthopedics;  Laterality: Left;  DEPUY/PINNACLE CUPS/SROM STEM  . TOTAL HIP ARTHROPLASTY Left   . TOTAL HIP ARTHROPLASTY Right 02/15/2015   Procedure: TOTAL HIP ARTHROPLASTY;  Surgeon: Gean Birchwood, MD;  Location: [facility] OR;  Service: Orthopedics;  Laterality: Right;  . TOTAL KNEE ARTHROPLASTY  10/21/2011   Procedure: TOTAL KNEE ARTHROPLASTY;  Surgeon: Nestor Lewandowsky, MD;  Location: [facility] OR;  Service: Orthopedics;  Laterality: Right;  DEPUY SIGMA RP  . TUBAL LIGATION      Allergies as of 11/08/2019      Reactions   Codeine Nausea Only, Other (See Comments)   headaches   Sulfa Antibiotics Nausea Only   Upset stomach, headaches   Sulfa Drugs Cross Reactors Other (See Comments)   Its been a long time ago      Medication List       Accurate as of November 08, 2019  1:19 PM. If you have any questions, ask your nurse or doctor.        acetaminophen 325 MG tablet Commonly known as: TYLENOL Take 2 tablets (650 mg total) by mouth every 6 (six) hours as needed for mild pain (or Fever >/= 101).   amLODipine  10 MG tablet Commonly known as: NORVASC Take 10 mg by mouth daily. For HTN   aspirin EC 325 MG tablet Take 1 tablet (325 mg total) by mouth 2 (two) times daily.   cetirizine 10 MG tablet Commonly known as: ZYRTEC Take 10 mg by mouth daily.   citalopram 40 MG tablet Commonly known as: CELEXA Take 40 mg by mouth daily.   clonazePAM 0.5 MG tablet Commonly known as: KLONOPIN Take 1 tablet (0.5 mg total) by mouth 3 (three) times daily. For Anxiety   cloNIDine 0.2 MG tablet Commonly known as: CATAPRES Take 1 tablet (0.2 mg total) by mouth 2 (two) times daily.   clopidogrel 75 MG tablet Commonly known as: PLAVIX Take 1 tablet (75 mg total) by mouth daily.   diphenhydrAMINE 25 MG tablet Commonly known as: BENADRYL Take 25 mg by mouth every 6 (six) hours as needed for itching.   doxazosin 8 MG tablet Commonly known as: CARDURA Take 8 mg by mouth at bedtime.   FLAX SEED OIL  PO Take 1,000 mg by mouth daily.   gabapentin 300 MG capsule Commonly known as: NEURONTIN Take 300 mg by mouth 3 (three) times daily.   ipratropium-albuterol 0.5-2.5 (3) MG/3ML Soln Commonly known as: DUONEB Take 3 mLs by nebulization every 6 (six) hours.   losartan 100 MG tablet Commonly known as: COZAAR Take 100 mg by mouth daily.   methocarbamol 500 MG tablet Commonly known as: ROBAXIN Take 500 mg by mouth every 8 (eight) hours as needed for muscle spasms.   metoprolol tartrate 25 MG tablet Commonly known as: LOPRESSOR Take 0.5 tablets (12.5 mg total) by mouth 2 (two) times daily.   montelukast 10 MG tablet Commonly known as: SINGULAIR Take 10 mg by mouth at bedtime.   neomycin-bacitracin-polymyxin [phone] ointment Apply 1 application topically every 6 (six) hours as needed (Itching).   NON FORMULARY Diet: Reg, NAS Heart Healthy   omeprazole 20 MG capsule Commonly known as: PRILOSEC Take 40 mg by mouth daily.   potassium chloride SA 20 MEQ tablet Commonly known as: KLOR-CON Take 20 mEq by mouth 2 (two) times daily.   QUEtiapine 50 MG tablet Commonly known as: SEROQUEL Take 1 tablet (50 mg total) by mouth at bedtime.   rosuvastatin 20 MG tablet Commonly known as: CRESTOR Take 1 tablet (20 mg total) by mouth daily at 6 PM.   triamcinolone cream 0.1 % Commonly known as: KENALOG Apply 1 application topically daily as needed (Itching).   VITAMIN B 12 PO Take 1,000 mg by mouth daily.   vitamin C 500 MG tablet Commonly known as: ASCORBIC ACID Take 500 mg by mouth daily.   Vitamin D 50 MCG (2000 UT) tablet Take 2,000 Units by mouth daily.       No orders of the defined types were placed in this encounter.   Immunization History  Administered Date(s) Administered  . Influenza-Unspecified 05/31/2014, 07/01/2019  . PPD Test 03/01/2015    Social History   Tobacco Use  . Smoking status: Never Smoker  . Smokeless tobacco: Never Used  Substance  Use Topics  . Alcohol use: Yes    Comment: 2x's /month, socially      Review of Systems  DATA OBTAINED: from patient, nurse, medical record, family member GENERAL:  no fevers, fatigue, appetite changes SKIN: No itching, rash HEENT: No complaint RESPIRATORY: No cough, wheezing, SOB CARDIAC: No chest pain, palpitations, lower extremity edema  GI: No abdominal pain, No N/V/D or constipation, No heartburn or  reflux  GU: No dysuria, frequency or urgency, or incontinence  MUSCULOSKELETAL: No unrelieved bone/joint pain NEUROLOGIC: No headache, dizziness  PSYCHIATRIC: No overt anxiety or sadness  Vitals:   11/08/19 1318  BP: (!) 142/63  Pulse: 70  Resp: 17  Temp: (!) 97.3 F (36.3 C)  SpO2: 95%   Body mass index is 41.59 kg/m. Physical Exam  GENERAL APPEARANCE: Alert, conversant, No acute distress  SKIN: No diaphoresis rash HEENT: Unremarkable RESPIRATORY: Breathing is even, unlabored. Lung sounds are clear   CARDIOVASCULAR: Heart RRR no murmurs, rubs or gallops. No peripheral edema  GASTROINTESTINAL: Abdomen is soft, non-tender, not distended w/ normal bowel sounds.  GENITOURINARY: Bladder non tender, not distended  MUSCULOSKELETAL: No abnormal joints or musculature NEUROLOGIC: Cranial nerves 2-12 grossly intact. Moves all extremities PSYCHIATRIC: Mood and affect appropriate to situation, no behavioral issues  Patient Active Problem List   Diagnosis Date Noted  . Urticaria 10/01/2019  . Wheezing 10/01/2019  . Hyperlipidemia 09/26/2019  . Hypomagnesemia 09/17/2019  . Basal ganglia stroke ([place]) 09/15/2019  . Depression 09/15/2019  . 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 Mild cognitive impairment 03/11/2017  . Syncopal episodes 05/16/2015  . Acute renal injury ([place]) 05/16/2015  . Elevated troponin 05/16/2015  . Hypertension 05/16/2015  . Non compliance w medication regimen 05/16/2015  . Pulmonary nodule 05/16/2015  . Pressure ulcer 02/28/2015  . Dehydration 02/26/2015  .  Intractable vomiting with nausea 02/26/2015  . Hypokalemia 02/25/2015  . Abdominal pain   . Primary osteoarthritis of right hip 02/15/2015  . Severe recurrent major depressive disorder with psychotic features ([place]) 09/01/2014  . GAD (generalized anxiety disorder) 09/01/2014  . PTSD (post-traumatic stress disorder) 09/01/2014  . Hip arthritis left 12/23/2011  . 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 10/21/2011    CMP     Component Value Date/Time   NA 144 10/15/2019 1345   NA 144 09/20/2019 0000   K 3.7 10/15/2019 1345   CL 106 10/15/2019 1345   CO2 29 10/15/2019 1345   GLUCOSE 95 10/15/2019 1345   BUN 13 10/15/2019 1345   BUN 13 09/20/2019 0000   CREATININE 0.78 10/15/2019 1345   CALCIUM 8.7 (L) 10/15/2019 1345   PROT 6.2 (L) 10/15/2019 1345   ALBUMIN 3.0 (L) 10/15/2019 1345   AST 28 10/15/2019 1345   ALT 38 10/15/2019 1345   ALKPHOS 89 10/15/2019 1345   BILITOT 0.6 10/15/2019 1345   GFRNONAA >60 10/15/2019 1345   GFRAA >60 10/15/2019 1345   Recent Labs    09/15/19 0636 09/15/19 0636 09/16/19 0249 09/16/19 0249 09/17/19 0254 09/20/19 0000 10/15/19 1345  NA 146*   < > 145   < > 143 144 144  K 4.0   < > 3.3*   < > 3.5 3.6 3.7  CL 107   < > 106   < > 106 104 106  CO2 30   < > 28   < > 29 27* 29  GLUCOSE 96   < > 113*  --  100*  --  95  BUN 13   < > 12   < > 10 13 13   CREATININE 0.91   < > 0.86   < > 0.79 0.7 0.78  CALCIUM 8.8*   < > 8.8*   < > 8.7* 8.4* 8.7*  MG 1.6*  --   --   --   --   --   --    < > = values in this interval not displayed.  Recent Labs    09/14/19 1126 10/15/19 1345  AST 17 28  ALT 15 38  ALKPHOS 95 89  BILITOT 1.2 0.6  PROT 6.8 6.2*  ALBUMIN 3.5 3.0*   Recent Labs    09/14/19 1126 09/20/19 0000 10/15/19 1345  WBC 3.9* 5.2 3.6*  NEUTROABS 2.2 3 1.3*  HGB 12.6 12.1 12.0  HCT 35.5* 34* 34.2*  MCV 82.6  --  81.2  PLT 266 235 281   Recent Labs    09/15/19 0636  CHOL 175  LDLCALC 114*  TRIG 42   No results found  for: [facility] Lab Results  Component Value Date   TSH 0.174 (L) 05/17/2015   Lab Results  Component Value Date   HGBA1C 5.2 09/15/2019   Lab Results  Component Value Date   CHOL 175 09/15/2019   HDL 53 09/15/2019   LDLCALC 114 (H) 09/15/2019   TRIG 42 09/15/2019   CHOLHDL 3.3 09/15/2019    Significant Diagnostic Results in last 30 days:  DG Chest Port 1 View  Result Date: 10/15/2019 CLINICAL DATA:  Syncope EXAM: PORTABLE CHEST 1 VIEW COMPARISON:  09/15/2011 FINDINGS: Normal mediastinum and cardiac silhouette. Normal pulmonary vasculature. No evidence of effusion, infiltrate, or pneumothorax. No acute bony abnormality. IMPRESSION: No acute cardiopulmonary process. Electronically Signed   By: Genevive Bi M.D.   On: 10/15/2019 13:26    Assessment and Plan  No problem-specific Assessment & Plan notes found for this encounter.   Labs/tests ordered:    Margit Hanks, MD

## 2019-11-08 NOTE — Assessment & Plan Note (Signed)
Waxes and wanes; continue clonazepam 0.5 mg 3 times daily and Celexa 40 mg daily

## 2019-11-08 NOTE — Assessment & Plan Note (Signed)
Controlled on multiple medications; continue Norvasc 10 mg daily, clonidine 0.2 mg twice daily, metoprolol 12.5 mg twice daily, Cardura 8 mg nightly, and losartan 100 mg daily

## 2019-11-10 ENCOUNTER — Encounter: Payer: Self-pay | Admitting: Internal Medicine

## 2019-11-10 ENCOUNTER — Non-Acute Institutional Stay (SKILLED_NURSING_FACILITY): Payer: Medicare Other | Admitting: Internal Medicine

## 2019-11-10 DIAGNOSIS — I639 Cerebral infarction, unspecified: Secondary | ICD-10-CM | POA: Diagnosis not present

## 2019-11-10 DIAGNOSIS — R600 Localized edema: Secondary | ICD-10-CM | POA: Diagnosis not present

## 2019-11-10 DIAGNOSIS — I6381 Other cerebral infarction due to occlusion or stenosis of small artery: Secondary | ICD-10-CM

## 2019-11-10 NOTE — Progress Notes (Signed)
Location:    [name] Room Number: 201/D Place of Service:  SNF [phone]) Provider:  Alphonsa Overall, MD  Patient Care Team: Georgann Housekeeper, MD as PCP - General (Internal Medicine) Georgann Housekeeper, MD (Internal Medicine)  Extended Emergency Contact Information Primary Emergency Contact: Green,Margie Address: [address]          [place], [place] [address] [place] of [place] Home Phone: [phone] Mobile Phone: [phone] Relation: Mother Secondary Emergency Contact: Evern Bio [place] of [place] Home Phone: [phone] Relation: Sister  Code Status:  Full Code Goals of care: Advanced Directive information Advanced Directives 11/10/2019  Does Patient Have a Medical Advance Directive? Yes  Type of Advance Directive (No Data)  Does patient want to make changes to medical advance directive? No - Patient declined  Copy of Healthcare Power of Attorney in Chart? -  Would patient like information on creating a medical advance directive? -  Pre-existing out of facility DNR order (yellow form or pink MOST form) -     Chief Complaint  Patient presents with  . Acute Visit    Right Side Edema    HPI:  Pt is a 64 y.o. female seen today for an acute visit for edema of her right upper and lower extremities. 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  She has had some edema on her right side but she says this gradually has been slowly increasing.  She does state at one time she had been on a diuretic.  She is not complaining of any increased pain or shortness of breath.  Vital signs are stable   Past Medical History:  Diagnosis Date  . Anxiety    "Breakdown" in the 1990's, hosp. for >1 wk. at [facility]    . Anxiety   . Arthritis    OA- R knee, L hip   . Asthma    years ago. "no problems in a long while."  . Constipation    . Depression   . GERD (gastroesophageal reflux disease)   . Goiter    removed  at age 61  . Hypertension    all BP meds. managed by Dr. Eula Listen    . Hypertension   . Incontinence of urine    weak bladder  . Pneumonia    while at [facility].- 1990's told that she had pneumonia & was isolated   . Stroke (HCC)    2005, R sided weakness   . Stroke [facility]) 2005   ? weakness in hand and arms per mother. patient unsure   Past Surgical History:  Procedure Laterality Date  . ABDOMINAL HYSTERECTOMY    . COLONOSCOPY    . DILATION AND CURETTAGE OF UTERUS     1980's  . goiter     removed age 22  . JOINT REPLACEMENT  10/2011   Rt knee  . JOINT REPLACEMENT Right    knee  . THYROIDECTOMY, PARTIAL    . TONSILLECTOMY    . TOTAL HIP ARTHROPLASTY  12/23/2011   Procedure: TOTAL HIP ARTHROPLASTY;  Surgeon: Nestor Lewandowsky, MD;  Location: [facility] OR;  Service: Orthopedics;  Laterality: Left;  DEPUY/PINNACLE CUPS/SROM STEM  . TOTAL HIP ARTHROPLASTY Left   . TOTAL HIP ARTHROPLASTY Right 02/15/2015   Procedure: TOTAL HIP ARTHROPLASTY;  Surgeon: Gean Birchwood, MD;  Location: [facility] OR;  Service: Orthopedics;  Laterality: Right;  . TOTAL  KNEE ARTHROPLASTY  10/21/2011   Procedure: TOTAL KNEE ARTHROPLASTY;  Surgeon: Kerin Salen, MD;  Location: [facility];  Service: Orthopedics;  Laterality: Right;  DEPUY SIGMA RP  . TUBAL LIGATION      Allergies  Allergen Reactions  . Codeine Nausea Only and Other (See Comments)    headaches  . Sulfa Antibiotics Nausea Only    Upset stomach, headaches  . Sulfa Drugs Cross Reactors Other (See Comments)    Its been a long time ago    Outpatient Encounter Medications as of 11/10/2019  Medication Sig  . acetaminophen (TYLENOL) 325 MG tablet Take 2 tablets (650 mg total) by mouth every 6 (six) hours as needed for mild pain (or Fever >/= 101).  Marland Kitchen amLODipine (NORVASC) 10 MG tablet Take 10 mg by mouth daily. For HTN  . aspirin EC 325 MG tablet Take 1 tablet (325 mg total) by mouth 2  (two) times daily.  . cetirizine (ZYRTEC) 10 MG tablet Take 10 mg by mouth daily.  . Cholecalciferol (VITAMIN D) 2000 UNITS tablet Take 2,000 Units by mouth daily.  . citalopram (CELEXA) 40 MG tablet Take 40 mg by mouth daily.   . clonazePAM (KLONOPIN) 0.5 MG tablet Take 1 tablet (0.5 mg total) by mouth 3 (three) times daily. For Anxiety  . cloNIDine (CATAPRES) 0.2 MG tablet Take 1 tablet (0.2 mg total) by mouth 2 (two) times daily.  . clopidogrel (PLAVIX) 75 MG tablet Take 1 tablet (75 mg total) by mouth daily.  . Cyanocobalamin (VITAMIN B 12 PO) Take 1,000 mg by mouth daily.   . diphenhydrAMINE (BENADRYL) 25 MG tablet Take 25 mg by mouth every 6 (six) hours as needed for itching.  Marland Kitchen doxazosin (CARDURA) 8 MG tablet Take 8 mg by mouth at bedtime.  . Flaxseed, Linseed, (FLAX SEED OIL PO) Take 1,000 mg by mouth daily.   . furosemide (LASIX) 20 MG tablet Take 20 mg by mouth daily.  Marland Kitchen gabapentin (NEURONTIN) 300 MG capsule Take 300 mg by mouth 3 (three) times daily.  Marland Kitchen ipratropium-albuterol (DUONEB) 0.5-2.5 (3) MG/3ML SOLN Take 3 mLs by nebulization every 6 (six) hours.  Marland Kitchen losartan (COZAAR) 100 MG tablet Take 100 mg by mouth daily.  . methocarbamol (ROBAXIN) 500 MG tablet Take 500 mg by mouth every 8 (eight) hours as needed for muscle spasms.  . metoprolol tartrate (LOPRESSOR) 25 MG tablet Take 0.5 tablets (12.5 mg total) by mouth 2 (two) times daily.  . montelukast (SINGULAIR) 10 MG tablet Take 10 mg by mouth at bedtime.  Marland Kitchen neomycin-bacitracin-polymyxin (NEOSPORIN) [phone] ointment Apply 1 application topically every 6 (six) hours as needed (Itching).  . NON FORMULARY Diet: Reg, NAS Heart Healthy  . omeprazole (PRILOSEC) 20 MG capsule Take 40 mg by mouth daily.  . potassium chloride SA (KLOR-CON) 20 MEQ tablet Take 20 mEq by mouth 2 (two) times daily.  . QUEtiapine (SEROQUEL) 50 MG tablet Take 1 tablet (50 mg total) by mouth at bedtime.  . rosuvastatin (CRESTOR) 20 MG tablet Take 1 tablet (20  mg total) by mouth daily at 6 PM.  . triamcinolone cream (KENALOG) 0.1 % Apply 1 application topically daily as needed (Itching).  . vitamin C (ASCORBIC ACID) 500 MG tablet Take 500 mg by mouth daily.   No facility-administered encounter medications on file as of 11/10/2019.    Review of Systems   In general she not complaining of any fever or chills.  Skin does not complain of rashes or itching at one point  did have generalized itching but this appears to have largely resolved.  Head ears eyes nose mouth and throat is not complain of visual changes or sore throat.  Respiratory does not complain of shortness of breath or cough.  Cardiac does not complain of chest pain does have right-sided edema.  GI does not complain of abdominal discomfort nausea vomiting diarrhea constipation.  GU does not complain of dysuria.  Musculoskeletal positive for right-sided weakness status post CVA at this point pain appears to be controlled.  Neurologic as noted above she does not complain of headache dizziness or syncope.  And psych does have some history of anxiety and depression this appears relatively well controlled she does not complain of overt anxiety today.    Immunization History  Administered Date(s) Administered  . Influenza-Unspecified 05/31/2014, 07/01/2019  . PPD Test 03/01/2015   Pertinent  Health Maintenance Due  Topic Date Due  . PAP SMEAR-Modifier  04/28/1975  . COLONOSCOPY  04/27/2004  . DEXA SCAN  04/28/2019  . PNA vac Low Risk Adult (1 of 2 - PCV13) 04/28/2019  . MAMMOGRAM  05/26/2021  . INFLUENZA VACCINE  Completed   Fall Risk  03/11/2017  Falls in the past year? Yes  Number falls in past yr: 1  Injury with Fall? No   Functional Status Survey:    Vitals:   11/10/19 1620  BP: 117/61  Pulse: 61  Resp: 18  Temp: 97.7 F (36.5 C)  TempSrc: Oral  SpO2: 98%  Weight: 220 lb 1.6 oz (99.8 kg)  Height: 5\' 1"  (1.549 m)   Body mass index is 41.59  kg/m. Physical Exam   Doing well in general this is a pleasant elderly female in no distress lying comfortably in bed.  Her skin is warm and dry.  Eyes visual acuity appears to be intact sclera and conjunctive are clear.  Oropharynx is clear mucous membranes moist.  Chest is clear to auscultation there is no labored breathing.  Heart is regular rate and rhythm without murmur gallop or rub.  Abdomen is obese soft nontender with positive bowel sounds.  Musculoskeletal positive for right-sided weakness with minimal movement-moves her left upper extremities at baseline she does have edema which appears somewhat increased from previous exams this is cool to touch nonerythematous her radial pulse is intact pedal pulse was somewhat difficult to palpate because of edema.  Neurologic again positive for right-sided weakness cranial nerves appear to be intact her speech is slightly slurred.  Psych she is alert and oriented pleasant and appropriate  Labs reviewed: Recent Labs    09/15/19 0636 09/15/19 0636 09/16/19 0249 09/16/19 0249 09/17/19 0254 09/20/19 0000 10/15/19 1345  NA 146*   < > 145   < > 143 144 144  K 4.0   < > 3.3*   < > 3.5 3.6 3.7  CL 107   < > 106   < > 106 104 106  CO2 30   < > 28   < > 29 27* 29  GLUCOSE 96   < > 113*  --  100*  --  95  BUN 13   < > 12   < > 10 13 13   CREATININE 0.91   < > 0.86   < > 0.79 0.7 0.78  CALCIUM 8.8*   < > 8.8*   < > 8.7* 8.4* 8.7*  MG 1.6*  --   --   --   --   --   --    < > =  values in this interval not displayed.   Recent Labs    09/14/19 1126 10/15/19 1345  AST 17 28  ALT 15 38  ALKPHOS 95 89  BILITOT 1.2 0.6  PROT 6.8 6.2*  ALBUMIN 3.5 3.0*   Recent Labs    09/14/19 1126 09/20/19 0000 10/15/19 1345  WBC 3.9* 5.2 3.6*  NEUTROABS 2.2 3 1.3*  HGB 12.6 12.1 12.0  HCT 35.5* 34* 34.2*  MCV 82.6  --  81.2  PLT 266 235 281   Lab Results  Component Value Date   TSH 0.174 (L) 05/17/2015   Lab Results  Component  Value Date   HGBA1C 5.2 09/15/2019   Lab Results  Component Value Date   CHOL 175 09/15/2019   HDL 53 09/15/2019   LDLCALC 114 (H) 09/15/2019   TRIG 42 09/15/2019   CHOLHDL 3.3 09/15/2019    Significant Diagnostic Results in last 30 days:  DG Chest Port 1 View  Result Date: 10/15/2019 CLINICAL DATA:  Syncope EXAM: PORTABLE CHEST 1 VIEW COMPARISON:  09/15/2011 FINDINGS: Normal mediastinum and cardiac silhouette. Normal pulmonary vasculature. No evidence of effusion, infiltrate, or pneumothorax. No acute bony abnormality. IMPRESSION: No acute cardiopulmonary process. Electronically Signed   By: Genevive Bi M.D.   On: 10/15/2019 13:26    Assessment/Plan  #1-history of right-sided edema-status post CVA-out of abundance of caution will order venous Dopplers to rule out a DVT.  Also will start Lasix 20 mg a day.  Also will obtain a weight tomorrow to see if there is significant weight gain recently.  Will update a metabolic panel tomorrow to see where her renal function and electrolytes She also apparently has some history of low magnesium will obtain this as well.  .  Continue to monitor vital signs pulse ox every shift for 48 hours.  Clinically she appears to be stable-continues to work with therapy.  [id number]

## 2019-11-11 ENCOUNTER — Non-Acute Institutional Stay (SKILLED_NURSING_FACILITY): Payer: Medicare Other | Admitting: Internal Medicine

## 2019-11-11 ENCOUNTER — Encounter: Payer: Self-pay | Admitting: Internal Medicine

## 2019-11-11 DIAGNOSIS — F32A Depression, unspecified: Secondary | ICD-10-CM

## 2019-11-11 DIAGNOSIS — R600 Localized edema: Secondary | ICD-10-CM

## 2019-11-11 DIAGNOSIS — I6930 Unspecified sequelae of cerebral infarction: Secondary | ICD-10-CM

## 2019-11-11 DIAGNOSIS — F329 Major depressive disorder, single episode, unspecified: Secondary | ICD-10-CM | POA: Diagnosis not present

## 2019-11-11 LAB — BASIC METABOLIC PANEL
BUN: 18 (ref 4–21)
CO2: 27 — AB (ref 13–22)
Chloride: 107 (ref 99–108)
Creatinine: 0.7 (ref 0.5–1.1)
Glucose: 92
Potassium: 3.6 (ref 3.4–5.3)
Sodium: 145 (ref 137–147)

## 2019-11-11 LAB — COMPREHENSIVE METABOLIC PANEL
Albumin: 3.4 — AB (ref 3.5–5.0)
Calcium: 8.8 (ref 8.7–10.7)
GFR calc Af Amer: 90
GFR calc non Af Amer: 87.83
Globulin: 2.7

## 2019-11-11 LAB — HEPATIC FUNCTION PANEL
ALT: 13 (ref 7–35)
AST: 12 — AB (ref 13–35)
Alkaline Phosphatase: 91 (ref 25–125)
Bilirubin, Total: 0.5

## 2019-11-11 NOTE — Progress Notes (Signed)
Location:    [name] Room Number: 201/D Place of Service:  SNF [phone]) Provider:  Alphonsa Overall, MD  Patient Care Team: Georgann Housekeeper, MD as PCP - General (Internal Medicine) Georgann Housekeeper, MD (Internal Medicine)  Extended Emergency Contact Information Primary Emergency Contact: Green,Margie Address: [address]          [place], [place] [address] [place] of [place] Home Phone: [phone] Mobile Phone: [phone] Relation: Mother Secondary Emergency Contact: Evern Bio [place] of [place] Home Phone: [phone] Relation: Sister  Code Status:  Full Code Goals of care: Advanced Directive information Advanced Directives 11/11/2019  Does Patient Have a Medical Advance Directive? Yes  Type of Advance Directive (No Data)  Does patient want to make changes to medical advance directive? No - Patient declined  Copy of Healthcare Power of Attorney in Chart? -  Would patient like information on creating a medical advance directive? -  Pre-existing out of facility DNR order (yellow form or pink MOST form) -     Chief Complaint  Patient presents with  . Follow-up    Follow Up Edema     HPI:  Pt is a 64 y.o. female seen today for an acute visit for follow-up of right sided edema.  Patient has a history of recent CVA with right-sided weakness and corresponding edema.  She was seen yesterday for this and venous Dopplers were ordered of her upper and lower extremities which have come back negative for DVT.  She also has been started on low-dose Lasix-she states she has been on a diuretic before fluid edema.  Today she has just finished working with therapy.  She does not have any acute complaints-she is somewhat frustrated with her progress and would like to get stronger faster-we did discuss this and she expressed realization that improvement will take some time.  She does a history of depression and continues  on Celexa.  She is also on clonazepam 3 times a day with a history of anxiety.  In regards to the edema this remains relativelystable-she did have a weight done today which shows a slight gain of about 2 pounds in the past week and about 4 pounds in the past month  She continues to deny any increased shortness of breath-.   She continues on Plavix and aspirin with a history of CVA.  Marland Kitchen     Past Medical History:  Diagnosis Date  . Anxiety    "Breakdown" in the 1990's, hosp. for >1 wk. at [facility]    . Anxiety   . Arthritis    OA- R knee, L hip   . Asthma    years ago. "no problems in a long while."  . Constipation   . Depression   . GERD (gastroesophageal reflux disease)   . Goiter    removed  at age 70  . Hypertension    all BP meds. managed by Dr. Eula Listen    . Hypertension   . Incontinence of urine    weak bladder  . Pneumonia    while at [facility].- 1990's told that she had pneumonia & was isolated   . Stroke (HCC)    2005, R sided weakness   . Stroke [facility]) 2005   ? weakness in hand and arms per mother. patient unsure   Past Surgical History:  Procedure Laterality Date  . ABDOMINAL HYSTERECTOMY    . COLONOSCOPY    . DILATION AND CURETTAGE OF UTERUS  1980's  . goiter     removed age 37  . JOINT REPLACEMENT  10/2011   Rt knee  . JOINT REPLACEMENT Right    knee  . THYROIDECTOMY, PARTIAL    . TONSILLECTOMY    . TOTAL HIP ARTHROPLASTY  12/23/2011   Procedure: TOTAL HIP ARTHROPLASTY;  Surgeon: Kerin Salen, MD;  Location: [facility];  Service: Orthopedics;  Laterality: Left;  DEPUY/PINNACLE CUPS/SROM STEM  . TOTAL HIP ARTHROPLASTY Left   . TOTAL HIP ARTHROPLASTY Right 02/15/2015   Procedure: TOTAL HIP ARTHROPLASTY;  Surgeon: Frederik Pear, MD;  Location: [facility];  Service: Orthopedics;  Laterality: Right;  . TOTAL KNEE ARTHROPLASTY  10/21/2011   Procedure: TOTAL KNEE ARTHROPLASTY;  Surgeon: Kerin Salen, MD;  Location: [facility];  Service: Orthopedics;  Laterality: Right;   DEPUY SIGMA RP  . TUBAL LIGATION      Allergies  Allergen Reactions  . Codeine Nausea Only and Other (See Comments)    headaches  . Sulfa Antibiotics Nausea Only    Upset stomach, headaches  . Sulfa Drugs Cross Reactors Other (See Comments)    Its been a long time ago    Outpatient Encounter Medications as of 11/11/2019  Medication Sig  . acetaminophen (TYLENOL) 325 MG tablet Take 2 tablets (650 mg total) by mouth every 6 (six) hours as needed for mild pain (or Fever >/= 101).  Marland Kitchen amLODipine (NORVASC) 10 MG tablet Take 10 mg by mouth daily. For HTN  . aspirin EC 325 MG tablet Take 1 tablet (325 mg total) by mouth 2 (two) times daily.  . cetirizine (ZYRTEC) 10 MG tablet Take 10 mg by mouth daily.  . Cholecalciferol (VITAMIN D) 2000 UNITS tablet Take 2,000 Units by mouth daily.  . citalopram (CELEXA) 40 MG tablet Take 40 mg by mouth daily.   . clonazePAM (KLONOPIN) 0.5 MG tablet Take 1 tablet (0.5 mg total) by mouth 3 (three) times daily. For Anxiety  . cloNIDine (CATAPRES) 0.2 MG tablet Take 1 tablet (0.2 mg total) by mouth 2 (two) times daily.  . clopidogrel (PLAVIX) 75 MG tablet Take 1 tablet (75 mg total) by mouth daily.  . Cyanocobalamin (VITAMIN B 12 PO) Take 1,000 mg by mouth daily.   . diphenhydrAMINE (BENADRYL) 25 MG tablet Take 25 mg by mouth every 6 (six) hours as needed for itching.  Marland Kitchen doxazosin (CARDURA) 8 MG tablet Take 8 mg by mouth at bedtime.  . Flaxseed, Linseed, (FLAX SEED OIL PO) Take 1,000 mg by mouth daily.   . furosemide (LASIX) 20 MG tablet Take 20 mg by mouth daily.  Marland Kitchen gabapentin (NEURONTIN) 300 MG capsule Take 300 mg by mouth 3 (three) times daily.  Marland Kitchen ipratropium-albuterol (DUONEB) 0.5-2.5 (3) MG/3ML SOLN Take 3 mLs by nebulization every 6 (six) hours.  Marland Kitchen losartan (COZAAR) 100 MG tablet Take 100 mg by mouth daily.  . methocarbamol (ROBAXIN) 500 MG tablet Take 500 mg by mouth every 8 (eight) hours as needed for muscle spasms.  . metoprolol tartrate (LOPRESSOR)  25 MG tablet Take 0.5 tablets (12.5 mg total) by mouth 2 (two) times daily.  . montelukast (SINGULAIR) 10 MG tablet Take 10 mg by mouth at bedtime.  Marland Kitchen neomycin-bacitracin-polymyxin (NEOSPORIN) [phone] ointment Apply 1 application topically every 6 (six) hours as needed (Itching).  . NON FORMULARY Diet: Reg, NAS Heart Healthy  . omeprazole (PRILOSEC) 20 MG capsule Take 40 mg by mouth daily.  . potassium chloride SA (KLOR-CON) 20 MEQ tablet Take 20 mEq by  mouth 2 (two) times daily.  . QUEtiapine (SEROQUEL) 50 MG tablet Take 1 tablet (50 mg total) by mouth at bedtime.  . rosuvastatin (CRESTOR) 20 MG tablet Take 1 tablet (20 mg total) by mouth daily at 6 PM.  . triamcinolone cream (KENALOG) 0.1 % Apply 1 application topically daily as needed (Itching).  . vitamin C (ASCORBIC ACID) 500 MG tablet Take 500 mg by mouth daily.   No facility-administered encounter medications on file as of 11/11/2019.    Review of Systems   General she is not complaining of any fever or chills.  Skin does not complain of rashes or itching.  Head ears eyes nose mouth and throat no complaints of visual changes or sore throat or difficulty swallowing.  Respiratory is not complain of shortness of breath does have nebulizers but this is been routine and not new.  Cardiac does not complain of chest pain does have right-sided edema.  GI is not complaining of abdominal discomfort nausea vomiting diarrhea constipation.  GU no complaints of dysuria.  Musculoskeletal is positive for right-sided weakness with history of CVA is not really complaining of discomfort at this point.  Neurologic as noted above does not complain of syncope headache dizziness or increased numbness.  And psych does have some history of depression with anxiety appears relatively well controlled she is a bit frustrated with her progress.    Immunization History  Administered Date(s) Administered  . Influenza-Unspecified 05/31/2014,  07/01/2019  . PPD Test 03/01/2015   Pertinent  Health Maintenance Due  Topic Date Due  . PAP SMEAR-Modifier  04/28/1975  . COLONOSCOPY  04/27/2004  . DEXA SCAN  04/28/2019  . PNA vac Low Risk Adult (1 of 2 - PCV13) 04/28/2019  . MAMMOGRAM  05/26/2021  . INFLUENZA VACCINE  Completed   Fall Risk  03/11/2017  Falls in the past year? Yes  Number falls in past yr: 1  Injury with Fall? No   Functional Status Survey:    Vitals:   11/11/19 1049  BP: 125/87  Pulse: 66  Resp: 18  Temp: 98.3 F (36.8 C)  TempSrc: Oral  SpO2: 97%  Weight: 220 lb 1.6 oz (99.8 kg)  Height: 5\' 1"  (1.549 m)  Of note updated weight is 222 pounds Body mass index is 41.59 kg/m. Physical Exam   In general this is a pleasant elderly female in no distress.  Her skin is warm and dry.  Eyes visual acuity appears to be intact sclera and conjunctive are clear.  Chest is clear to auscultation there is no labored breathing.  Heart is regular rate and rhythm without murmur gallop or rub.  Abdomen is obese soft nontender with positive bowel sounds.  Musculoskeletal again positive for right-sided weakness with edema which continues to be cool to touch not erythematous radial pulses intact-pedal pulse continues to be somewhat difficult to palpate because of edema.  Neurologic as noted above positive for right-sided weakness at baseline moves other extremities at baseline her speech is minimally slurred.  And psych she is alert and oriented a little tearful at times but was in better spirits after talking with her and discussing her status and the challenges she has faced.    Labs reviewed:  \November 11, 2019.  Sodium 145 potassium 3.6 BUN 18 creatinine 0.72.  Albumin 3.4 otherwise liver function tests within normal limits.  Magnesium level was 1.7.  Assessment plan.  1.  Increased edema-this appears to be more dependent related with the history of CVA  with right-sided weakness-venous Dopplers were  negative for DVT.  She has been started on low-dose Lasix will continue this-.  Metabolic panel shows stability will have to keep an eye on this however and will update this early next week.  She does have some history of hypokalemia she is already on potassium supplementation will await the updated labs next week.  Of note her magnesium level was within normal range at 1.7.  Clinically she appears to be stable we will continue to monitor.  2.  Depression with anxiety-at this point will monitor she is on Klonopin 0.5 mg 3 times daily as well as Celexa 40 mg a day. Her outlook appears to vary at times this will need to be monitored-appeared to be in good spirits before I left the room.   [id number]   ADDITIONAL  Recent Labs    09/15/19 0636 09/15/19 0636 09/16/19 0249 09/16/19 0249 09/17/19 0254 09/20/19 0000 10/15/19 1345  NA 146*   < > 145   < > 143 144 144  K 4.0   < > 3.3*   < > 3.5 3.6 3.7  CL 107   < > 106   < > 106 104 106  CO2 30   < > 28   < > 29 27* 29  GLUCOSE 96   < > 113*  --  100*  --  95  BUN 13   < > 12   < > 10 13 13   CREATININE 0.91   < > 0.86   < > 0.79 0.7 0.78  CALCIUM 8.8*   < > 8.8*   < > 8.7* 8.4* 8.7*  MG 1.6*  --   --   --   --   --   --    < > = values in this interval not displayed.   Recent Labs    09/14/19 1126 10/15/19 1345  AST 17 28  ALT 15 38  ALKPHOS 95 89  BILITOT 1.2 0.6  PROT 6.8 6.2*  ALBUMIN 3.5 3.0*   Recent Labs    09/14/19 1126 09/20/19 0000 10/15/19 1345  WBC 3.9* 5.2 3.6*  NEUTROABS 2.2 3 1.3*  HGB 12.6 12.1 12.0  HCT 35.5* 34* 34.2*  MCV 82.6  --  81.2  PLT 266 235 281   Lab Results  Component Value Date   TSH 0.174 (L) 05/17/2015   Lab Results  Component Value Date   HGBA1C 5.2 09/15/2019   Lab Results  Component Value Date   CHOL 175 09/15/2019   HDL 53 09/15/2019   LDLCALC 114 (H) 09/15/2019   TRIG 42 09/15/2019   CHOLHDL 3.3 09/15/2019    Significant Diagnostic Results in last 30 days:  DG  Chest Port 1 View  Result Date: 10/15/2019 CLINICAL DATA:  Syncope EXAM: PORTABLE CHEST 1 VIEW COMPARISON:  09/15/2011 FINDINGS: Normal mediastinum and cardiac silhouette. Normal pulmonary vasculature. No evidence of effusion, infiltrate, or pneumothorax. No acute bony abnormality. IMPRESSION: No acute cardiopulmonary process. Electronically Signed   By: 09/17/2011 M.D.   On: 10/15/2019 13:26

## 2019-11-12 ENCOUNTER — Encounter: Payer: Self-pay | Admitting: Internal Medicine

## 2019-11-16 LAB — COMPREHENSIVE METABOLIC PANEL
Calcium: 8.4 — AB (ref 8.7–10.7)
GFR calc Af Amer: 90
GFR calc non Af Amer: 83.59

## 2019-11-16 LAB — BASIC METABOLIC PANEL
BUN: 13 (ref 4–21)
CO2: 29 — AB (ref 13–22)
Chloride: 107 (ref 99–108)
Creatinine: 0.8 (ref 0.5–1.1)
Glucose: 83
Potassium: 3.2 — AB (ref 3.4–5.3)
Sodium: 145 (ref 137–147)

## 2019-11-20 LAB — BASIC METABOLIC PANEL
BUN: 15 (ref 4–21)
CO2: 23 — AB (ref 13–22)
Chloride: 106 (ref 99–108)
Creatinine: 0.6 (ref 0.5–1.1)
Glucose: 112
Potassium: 3.6 (ref 3.4–5.3)
Sodium: 145 (ref 137–147)

## 2019-11-20 LAB — COMPREHENSIVE METABOLIC PANEL
Calcium: 8.4 — AB (ref 8.7–10.7)
GFR calc Af Amer: 90
GFR calc non Af Amer: 90

## 2019-11-23 ENCOUNTER — Non-Acute Institutional Stay (SKILLED_NURSING_FACILITY): Payer: Medicare Other | Admitting: Internal Medicine

## 2019-11-23 ENCOUNTER — Encounter: Payer: Self-pay | Admitting: Internal Medicine

## 2019-11-23 DIAGNOSIS — R2231 Localized swelling, mass and lump, right upper limb: Secondary | ICD-10-CM | POA: Diagnosis not present

## 2019-11-23 NOTE — Progress Notes (Signed)
Location:  [facility] Room Number: 201-D Place of Service:  SNF (31)  Georgann Housekeeper, MD  Patient Care Team: Georgann Housekeeper, MD as PCP - General (Internal Medicine) Georgann Housekeeper, MD (Internal Medicine)  Extended Emergency Contact Information Primary Emergency Contact: Green,Margie Address: [address]          [place], [place] [address] D[place] of [place] Home Phone: [phone] Mobile Phone: [phone] Relation: Mother Secondary Emergency Contact: Richardson,Cynthia  [place] Home Phone: [phone] Relation: Sister    Allergies: Codeine, Sulfa antibiotics, and Sulfa drugs cross reactors  Chief Complaint  Patient presents with  . Acute Visit    Patient is seen for right upper extremity swelling.    HPI: Patient is a 64 y.o. female who nursing asked me to see because of swelling in her right upper extremity.  Patient came to SNF with swelling in her right upper extremity but it was really more on her right forearm from IV extravasation.  Now she has swelling in her actual right arm as well and pain, which is new.  Past Medical History:  Diagnosis Date  . Anxiety    "Breakdown" in the 1990's, hosp. for >1 wk. at [facility]    . Anxiety   . Arthritis    OA- R knee, L hip   . Asthma    years ago. "no problems in a long while."  . Constipation   . Depression   . GERD (gastroesophageal reflux disease)   . Goiter    removed  at age 49  . Hypertension    all BP meds. managed by Dr. Eula Listen    . Hypertension   . Incontinence of urine    weak bladder  . Pneumonia    while at [facility] 1990's told that she had pneumonia & was isolated   . Stroke (HCC)    2005, R sided weakness   . Stroke [facility]) 2005   ? weakness in hand and arms per mother. patient unsure    Past Surgical History:  Procedure Laterality Date  . ABDOMINAL HYSTERECTOMY    . COLONOSCOPY    . DILATION AND CURETTAGE OF UTERUS     1980's  .  goiter     removed age 46  . JOINT REPLACEMENT  10/2011   Rt knee  . JOINT REPLACEMENT Right    knee  . THYROIDECTOMY, PARTIAL    . TONSILLECTOMY    . TOTAL HIP ARTHROPLASTY  12/23/2011   Procedure: TOTAL HIP ARTHROPLASTY;  Surgeon: Nestor Lewandowsky, MD;  Location: [facility] OR;  Service: Orthopedics;  Laterality: Left;  DEPUY/PINNACLE CUPS/SROM STEM  . TOTAL HIP ARTHROPLASTY Left   . TOTAL HIP ARTHROPLASTY Right 02/15/2015   Procedure: TOTAL HIP ARTHROPLASTY;  Surgeon: Gean Birchwood, MD;  Location: [facility] OR;  Service: Orthopedics;  Laterality: Right;  . TOTAL KNEE ARTHROPLASTY  10/21/2011   Procedure: TOTAL KNEE ARTHROPLASTY;  Surgeon: Nestor Lewandowsky, MD;  Location: [facility] OR;  Service: Orthopedics;  Laterality: Right;  DEPUY SIGMA RP  . TUBAL LIGATION      Allergies as of 11/23/2019      Reactions   Codeine Nausea Only, Other (See Comments)   headaches   Sulfa Antibiotics Nausea Only   Upset stomach, headaches   Sulfa Drugs Cross Reactors Other (See Comments)   Its been a long time ago      Medication List       Accurate as of November 23, 2019  3:37 PM. If you have any questions, ask your nurse or doctor.        acetaminophen 325 MG tablet Commonly known as: TYLENOL Take 2 tablets (650 mg total) by mouth every 6 (six) hours as needed for mild pain (or Fever >/= 101).   amLODipine 10 MG tablet Commonly known as: NORVASC Take 10 mg by mouth daily. For HTN   aspirin EC 325 MG tablet Take 1 tablet (325 mg total) by mouth 2 (two) times daily.   cetirizine 10 MG tablet Commonly known as: ZYRTEC Take 10 mg by mouth daily.   citalopram 40 MG tablet Commonly known as: CELEXA Take 40 mg by mouth daily.   clonazePAM 0.5 MG tablet Commonly known as: KLONOPIN Take 1 tablet (0.5 mg total) by mouth 3 (three) times daily. For Anxiety   cloNIDine 0.2 MG tablet Commonly known as: CATAPRES Take 1 tablet (0.2 mg total) by mouth 2 (two) times daily.   clopidogrel 75 MG tablet Commonly known as:  PLAVIX Take 1 tablet (75 mg total) by mouth daily.   diphenhydrAMINE 25 MG tablet Commonly known as: BENADRYL Take 25 mg by mouth every 6 (six) hours as needed for itching.   doxazosin 8 MG tablet Commonly known as: CARDURA Take 8 mg by mouth at bedtime.   FLAX SEED OIL PO Take 1,000 mg by mouth daily.   furosemide 40 MG tablet Commonly known as: LASIX Take 40 mg by mouth daily. What changed: Another medication with the same name was removed. Continue taking this medication, and follow the directions you see here. Changed by: Inocencio Homes, MD   gabapentin 300 MG capsule Commonly known as: NEURONTIN Take 300 mg by mouth 3 (three) times daily.   ipratropium-albuterol 0.5-2.5 (3) MG/3ML Soln Commonly known as: DUONEB Take 3 mLs by nebulization every 6 (six) hours.   losartan 100 MG tablet Commonly known as: COZAAR Take 100 mg by mouth daily.   methocarbamol 500 MG tablet Commonly known as: ROBAXIN Take 500 mg by mouth every 8 (eight) hours as needed for muscle spasms.   metoprolol tartrate 25 MG tablet Commonly known as: LOPRESSOR Take 0.5 tablets (12.5 mg total) by mouth 2 (two) times daily.   montelukast 10 MG tablet Commonly known as: SINGULAIR Take 10 mg by mouth at bedtime.   neomycin-bacitracin-polymyxin [phone] ointment Apply 1 application topically every 6 (six) hours as needed (Itching).   NON FORMULARY Diet: Reg, NAS Heart Healthy   omeprazole 20 MG capsule Commonly known as: PRILOSEC Take 40 mg by mouth daily.   potassium chloride SA 20 MEQ tablet Commonly known as: KLOR-CON Take 20 mEq by mouth 2 (two) times daily.   QUEtiapine 50 MG tablet Commonly known as: SEROQUEL Take 1 tablet (50 mg total) by mouth at bedtime.   rosuvastatin 20 MG tablet Commonly known as: CRESTOR Take 1 tablet (20 mg total) by mouth daily at 6 PM.   triamcinolone cream 0.1 % Commonly known as: KENALOG Apply 1 application topically daily as needed (Itching).     VITAMIN B 12 PO Take 1,000 mg by mouth daily.   vitamin C 500 MG tablet Commonly known as: ASCORBIC ACID Take 500 mg by mouth daily.   Vitamin D 50 MCG (2000 UT) tablet Take 2,000 Units by mouth daily.       No orders of the defined types were placed in this encounter.   Immunization History  Administered Date(s) Administered  . Influenza-Unspecified 05/31/2014, 07/01/2019  . PPD  Test 03/01/2015    Social History   Tobacco Use  . Smoking status: Never Smoker  . Smokeless tobacco: Never Used  Substance Use Topics  . Alcohol use: Yes    Comment: 2x's /month, socially      Review of Systems  GENERAL:  no fevers, fatigue, appetite changes SKIN: No itching, rash HEENT: No complaint RESPIRATORY: No cough, wheezing, SOB CARDIAC: No chest pain, palpitations, lower extremity edema;+ right upper extremity swelling GI: No abdominal pain, No N/V/D or constipation, No heartburn or reflux  GU: No dysuria, frequency or urgency, or incontinence  MUSCULOSKELETAL: No unrelieved bone/joint pain NEUROLOGIC: No headache, dizziness  PSYCHIATRIC: No overt anxiety or sadness  Vitals:   11/23/19 1506  BP: 125/75  Pulse: 71  Resp: 19  Temp: (!) 97.5 F (36.4 C)  SpO2: 96%   Body mass index is 41.95 kg/m. Physical Exam  GENERAL APPEARANCE: Alert, conversant, No acute distress  SKIN: No diaphoresis rash HEENT: Unremarkable RESPIRATORY: Breathing is even, unlabored. Lung sounds are clear   CARDIOVASCULAR: Heart RRR no murmurs, rubs or gallops.  Right upper extremity peripheral edema, larger than before, no redness of bruising or heat; some tenderness GASTROINTESTINAL: Abdomen is soft, non-tender, not distended w/ normal bowel sounds.  GENITOURINARY: Bladder non tender, not distended  MUSCULOSKELETAL: No abnormal joints or musculature NEUROLOGIC: Cranial nerves 2-12 grossly intact. Moves all extremities PSYCHIATRIC: Mood and affect appropriate to situation, no behavioral  issues  Patient Active Problem List   Diagnosis Date Noted  . Urticaria 10/01/2019  . Wheezing 10/01/2019  . Hyperlipidemia 09/26/2019  . Hypomagnesemia 09/17/2019  . 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 Depression 09/15/2019  . 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 Mild cognitive impairment 03/11/2017  . Syncopal episodes 05/16/2015  . Acute renal injury (HCC) 05/16/2015  . Elevated troponin 05/16/2015  . Hypertension 05/16/2015  . Non compliance w medication regimen 05/16/2015  . Pulmonary nodule 05/16/2015  . Pressure ulcer 02/28/2015  . Dehydration 02/26/2015  . Intractable vomiting with nausea 02/26/2015  . Hypokalemia 02/25/2015  . Abdominal pain   . Primary osteoarthritis of right hip 02/15/2015  . Severe recurrent major depressive disorder with psychotic features (HCC) 09/01/2014  . GAD (generalized anxiety disorder) 09/01/2014  . PTSD (post-traumatic stress disorder) 09/01/2014  . Hip arthritis left 12/23/2011  . 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 10/21/2011    CMP     Component Value Date/Time   NA 145 11/20/2019 0000   K 3.6 11/20/2019 0000   CL 106 11/20/2019 0000   CO2 23 (A) 11/20/2019 0000   GLUCOSE 95 10/15/2019 1345   BUN 15 11/20/2019 0000   CREATININE 0.6 11/20/2019 0000   CREATININE 0.78 10/15/2019 1345   CALCIUM 8.4 (A) 11/20/2019 0000   PROT 6.2 (L) 10/15/2019 1345   ALBUMIN 3.4 (A) 11/11/2019 0000   AST 12 (A) 11/11/2019 0000   ALT 13 11/11/2019 0000   ALKPHOS 91 11/11/2019 0000   BILITOT 0.6 10/15/2019 1345   GFRNONAA 90 11/20/2019 0000   GFRAA 90 11/20/2019 0000   Recent Labs    09/15/19 0636 09/15/19 0636 09/16/19 0249 09/16/19 0249 09/17/19 0254 09/20/19 0000 10/15/19 1345 10/15/19 1345 11/11/19 0000 11/16/19 0000 11/20/19 0000  NA 146*   < > 145   < > 143   < > 144  --  145 145 145  K 4.0   < > 3.3*   < > 3.5   < > 3.7   < > 3.6 3.2* 3.6  CL 107   < > 106   < > 106   < > 106   < > 107 107 106  CO2 30   < > 28   < >  29   < > 29   < > 27* 29* 23*  GLUCOSE 96   < > 113*  --  100*  --  95  --   --   --   --   BUN 13   < > 12   < > 10   < > 13  --  18 13 15   CREATININE 0.91   < > 0.86   < > 0.79   < > 0.78  --  0.7 0.8 0.6  CALCIUM 8.8*   < > 8.8*   < > 8.7*   < > 8.7*   < > 8.8 8.4* 8.4*  MG 1.6*  --   --   --   --   --   --   --   --   --   --    < > = values in this interval not displayed.   Recent Labs    09/14/19 1126 10/15/19 1345 11/11/19 0000  AST 17 28 12*  ALT 15 38 13  ALKPHOS 95 89 91  BILITOT 1.2 0.6  --   PROT 6.8 6.2*  --   ALBUMIN 3.5 3.0* 3.4*   Recent Labs    09/14/19 1126 09/14/19 1126 09/20/19 0000 10/14/19 0000 10/15/19 1345  WBC 3.9*  --  5.2 3.9 3.6*  NEUTROABS 2.2  --  3  --  1.3*  HGB 12.6   < > 12.1 13.2 12.0  HCT 35.5*   < > 34* 38 34.2*  MCV 82.6  --   --   --  81.2  PLT 266   < > 235 312 281   < > = values in this interval not displayed.   Recent Labs    09/15/19 0636  CHOL 175  LDLCALC 114*  TRIG 42   No results found for: [facility] Lab Results  Component Value Date   TSH 0.174 (L) 05/17/2015   Lab Results  Component Value Date   HGBA1C 5.2 09/15/2019   Lab Results  Component Value Date   CHOL 175 09/15/2019   HDL 53 09/15/2019   LDLCALC 114 (H) 09/15/2019   TRIG 42 09/15/2019   CHOLHDL 3.3 09/15/2019    Significant Diagnostic Results in last 30 days:  No results found.  Assessment and Plan  Right upper extremity swelling-larger than prior; ultrasound to rule out DVT; elevation of that extremity    09/17/2019, MD

## 2019-11-24 ENCOUNTER — Other Ambulatory Visit: Payer: Self-pay | Admitting: Internal Medicine

## 2019-11-24 MED ORDER — CLONAZEPAM 0.5 MG PO TABS: 0.5000 mg | ORAL_TABLET | Freq: Three times a day (TID) | ORAL | 0 refills | Status: DC

## 2019-11-25 ENCOUNTER — Encounter: Payer: Self-pay | Admitting: Internal Medicine

## 2019-11-25 ENCOUNTER — Other Ambulatory Visit: Payer: Self-pay | Admitting: Internal Medicine

## 2019-11-25 ENCOUNTER — Non-Acute Institutional Stay (SKILLED_NURSING_FACILITY): Payer: Medicare Other | Admitting: Internal Medicine

## 2019-11-25 DIAGNOSIS — R600 Localized edema: Secondary | ICD-10-CM

## 2019-11-25 DIAGNOSIS — I10 Essential (primary) hypertension: Secondary | ICD-10-CM | POA: Diagnosis not present

## 2019-11-25 DIAGNOSIS — R52 Pain, unspecified: Secondary | ICD-10-CM | POA: Diagnosis not present

## 2019-11-25 DIAGNOSIS — F333 Major depressive disorder, recurrent, severe with psychotic symptoms: Secondary | ICD-10-CM

## 2019-11-25 MED ORDER — TRAMADOL HCL 50 MG PO TABS: 50.0000 mg | ORAL_TABLET | Freq: Three times a day (TID) | ORAL | 0 refills | Status: AC | PRN

## 2019-11-25 NOTE — Progress Notes (Signed)
Location:    [name] Room Number: 201/D Place of Service:  SNF [id number]) Provider:  Alphonsa Overall, MD  Patient Care Team: Georgann Housekeeper, MD as PCP - General (Internal Medicine) Georgann Housekeeper, MD (Internal Medicine)  Extended Emergency Contact Information Primary Emergency Contact: Green,Margie Address: [address]          [place], [place] [address] [place] of [place] Home Phone: [phone] Mobile Phone: [phone] Relation: Mother Secondary Emergency Contact: Evern [place] Home Phone: [phone] Relation: Sister  Code Status:  Full Code Goals of care: Advanced Directive information Advanced Directives 11/25/2019  Does Patient Have a Medical Advance Directive? Yes  Type of Advance Directive (No Data)  Does patient want to make changes to medical advance directive? No - Patient declined  Copy of Healthcare Power of Attorney in Chart? -  Would patient like information on creating a medical advance directive? -  Pre-existing out of facility DNR order (yellow form or pink MOST form) -     Chief Complaint  Patient presents with  . Acute Visit    Pain    HPI:  Pt is a 64 y.o. female seen today for an acute visit for follow-up of pain management. Patient has a history of a recent CVA with right-sided weakness and corresponding edema.  She has had previous Dopplers which were negative for DVT but another 1 is pending secondary to persistent edema.  She has also been started on Lasix this was increased earlier this week because of increased edema.  She continues to be somewhat frustrated with her progress and we have ordered a psychiatric consult she continues on Celexa 40 mg a day.  She says her pain complicates the issue.  She has orders for Neurontin 300 mg 3 times daily as well as Tylenol but she says the Tylenol is not really effective and she would like tramadol..  She also has  orders for Robaxin 500 mg every 8 hours as needed  She says she has tolerated tramadol well in the past.  Currently she is lying in bed she does not appear to be uncomfortable but does have somewhat of a flat affect.  Vital signs appear to be stable    Past Medical History:  Diagnosis Date  . Anxiety    "Breakdown" in the 1990's, hosp. for >1 wk. at [facility]    . Anxiety   . Arthritis    OA- R knee, L hip   . Asthma    years ago. "no problems in a long while."  . Constipation   . Depression   . GERD (gastroesophageal reflux disease)   . Goiter    removed  at age 64  . Hypertension    all BP meds. managed by Dr. Eula Listen    . Hypertension   . Incontinence of urine    weak bladder  . Pneumonia    while at [facility].- 1990's told that she had pneumonia & was isolated   . Stroke (HCC)    2005, R sided weakness   . Stroke [facility]) 2005   ? weakness in hand and arms per mother. patient unsure   Past Surgical History:  Procedure Laterality Date  . ABDOMINAL HYSTERECTOMY    . COLONOSCOPY    . DILATION AND CURETTAGE OF UTERUS     1980's  . goiter     removed age 66  . JOINT REPLACEMENT  10/2011   Rt  knee  . JOINT REPLACEMENT Right    knee  . THYROIDECTOMY, PARTIAL    . TONSILLECTOMY    . TOTAL HIP ARTHROPLASTY  12/23/2011   Procedure: TOTAL HIP ARTHROPLASTY;  Surgeon: Nestor Lewandowsky, MD;  Location: [facility] OR;  Service: Orthopedics;  Laterality: Left;  DEPUY/PINNACLE CUPS/SROM STEM  . TOTAL HIP ARTHROPLASTY Left   . TOTAL HIP ARTHROPLASTY Right 02/15/2015   Procedure: TOTAL HIP ARTHROPLASTY;  Surgeon: Gean Birchwood, MD;  Location: [facility] OR;  Service: Orthopedics;  Laterality: Right;  . TOTAL KNEE ARTHROPLASTY  10/21/2011   Procedure: TOTAL KNEE ARTHROPLASTY;  Surgeon: Nestor Lewandowsky, MD;  Location: [facility] OR;  Service: Orthopedics;  Laterality: Right;  DEPUY SIGMA RP  . TUBAL LIGATION      Allergies  Allergen Reactions  . Codeine Nausea Only and Other (See Comments)    headaches  .  Sulfa Antibiotics Nausea Only    Upset stomach, headaches  . Sulfa Drugs Cross Reactors Other (See Comments)    Its been a long time ago    Outpatient Encounter Medications as of 11/25/2019  Medication Sig  . acetaminophen (TYLENOL) 325 MG tablet Take 2 tablets (650 mg total) by mouth every 6 (six) hours as needed for mild pain (or Fever >/= 101).  Marland Kitchen amLODipine (NORVASC) 10 MG tablet Take 10 mg by mouth daily. For HTN  . aspirin EC 325 MG tablet Take 1 tablet (325 mg total) by mouth 2 (two) times daily.  . cetirizine (ZYRTEC) 10 MG tablet Take 10 mg by mouth daily.  . Cholecalciferol (VITAMIN D) 2000 UNITS tablet Take 2,000 Units by mouth daily.  . citalopram (CELEXA) 40 MG tablet Take 40 mg by mouth daily.   . clonazePAM (KLONOPIN) 0.5 MG tablet Take 1 tablet (0.5 mg total) by mouth 3 (three) times daily. For Anxiety  . cloNIDine (CATAPRES) 0.2 MG tablet Take 1 tablet (0.2 mg total) by mouth 2 (two) times daily.  . clopidogrel (PLAVIX) 75 MG tablet Take 1 tablet (75 mg total) by mouth daily.  . Cyanocobalamin (VITAMIN B 12 PO) Take 1,000 mg by mouth daily.   . diphenhydrAMINE (BENADRYL) 25 MG tablet Take 25 mg by mouth every 6 (six) hours as needed for itching.  Marland Kitchen doxazosin (CARDURA) 8 MG tablet Take 8 mg by mouth at bedtime.  . Flaxseed, Linseed, (FLAX SEED OIL PO) Take 1,000 mg by mouth daily.   . furosemide (LASIX) 40 MG tablet Take 40 mg by mouth daily.  Marland Kitchen gabapentin (NEURONTIN) 300 MG capsule Take 300 mg by mouth 3 (three) times daily.  Marland Kitchen ipratropium-albuterol (DUONEB) 0.5-2.5 (3) MG/3ML SOLN Take 3 mLs by nebulization every 6 (six) hours.  Marland Kitchen losartan (COZAAR) 100 MG tablet Take 100 mg by mouth daily.  . methocarbamol (ROBAXIN) 500 MG tablet Take 500 mg by mouth every 8 (eight) hours as needed for muscle spasms.  . metoprolol tartrate (LOPRESSOR) 25 MG tablet Take 0.5 tablets (12.5 mg total) by mouth 2 (two) times daily.  . montelukast (SINGULAIR) 10 MG tablet Take 10 mg by mouth at  bedtime.  Marland Kitchen neomycin-bacitracin-polymyxin (NEOSPORIN) [phone] ointment Apply 1 application topically every 6 (six) hours as needed (Itching).  . NON FORMULARY Diet: Reg, NAS Heart Healthy  . omeprazole (PRILOSEC) 20 MG capsule Take 40 mg by mouth daily.  . potassium chloride SA (KLOR-CON) 20 MEQ tablet Take 20 mEq by mouth 2 (two) times daily.  . QUEtiapine (SEROQUEL) 50 MG tablet Take 1 tablet (50 mg total) by mouth  at bedtime.  . rosuvastatin (CRESTOR) 20 MG tablet Take 1 tablet (20 mg total) by mouth daily at 6 PM.  . traMADol (ULTRAM) 50 MG tablet Take 50 mg by mouth every 8 (eight) hours as needed (For Pain X 2 weeks).   . triamcinolone cream (KENALOG) 0.1 % Apply 1 application topically daily as needed (Itching).  . vitamin C (ASCORBIC ACID) 500 MG tablet Take 500 mg by mouth daily.   No facility-administered encounter medications on file as of 11/25/2019.    Review of Systems   General she not complaining of any fever or chills.  Skin does not complain of rashes or itching.  Head ears eyes nose mouth and throat does not complain of visual changes or sore throat.  Respiratory is not complaining of being more short of breath or having a cough.  Cardiac does not complain of chest pain does continue to have right-sided edema upper and lower extremities.  GI does not complain of abdominal discomfort nausea vomiting diarrhea constipation.  GU is not complaining of dysuria.  Musculoskeletal complains of pain says is not relieved really with the Tylenol and Neurontin this is more her leg and arm on the right side.  Neurologic as noted above she does not complain of dizziness or headache does have some history of neuropathic type pain.  Psych as noted above does have some history of depression she is on Celexa and we do have a psychiatric consult pending she also continues on Seroquel 50 mg nightly.    Immunization History  Administered Date(s) Administered  .  Influenza-Unspecified 05/31/2014, 07/01/2019  . PPD Test 03/01/2015   Pertinent  Health Maintenance Due  Topic Date Due  . PAP SMEAR-Modifier  04/28/1975  . COLONOSCOPY  04/27/2004  . DEXA SCAN  04/28/2019  . PNA vac Low Risk Adult (1 of 2 - PCV13) 04/28/2019  . MAMMOGRAM  05/26/2021  . INFLUENZA VACCINE  Completed   Fall Risk  03/11/2017  Falls in the past year? Yes  Number falls in past yr: 1  Injury with Fall? No   Functional Status Survey:    Vitals:   11/25/19 1633  BP: 111/78  Pulse: 71  Resp: 18  Temp: 97.8 F (36.6 C)  TempSrc: Oral  SpO2: 96%  Weight: 222 lb (100.7 kg)  Height: 5\' 1"  (1.549 m)   Body mass index is 41.95 kg/m. Physical Exam   In general this is a pleasant elderly female in no distress but does have somewhat of a flat affect.  Her skin is warm and dry.  Eyes visual acuity appears to be intact sclera and conjunctive are clear.  Oropharynx is clear mucous membranes moist.  Chest is clear to auscultation there is no labored breathing.  Heart is regular rate and rhythm without murmur gallop or rub.  Continues to have edema of her right arm and right lower extremity-.  Abdomen is obese soft nontender with positive bowel sounds.  Musculoskeletal does continue with right-sided hemiparesis with edema this is cool to touch but is tender to touch.  Neurologic again positive for right-sided weakness with slightly slurred speech which is not new.  Psych she is alert and oriented pleasant and appropriate and has somewhat of a flat affect somewhat frustrated with her progress  Labs reviewed: Recent Labs    09/15/19 0636 09/15/19 0636 09/16/19 0249 09/16/19 0249 09/17/19 0254 09/20/19 0000 10/15/19 1345 10/15/19 1345 11/11/19 0000 11/16/19 0000 11/20/19 0000  NA 146*   < > 145   < >  143   < > 144  --  145 145 145  K 4.0   < > 3.3*   < > 3.5   < > 3.7   < > 3.6 3.2* 3.6  CL 107   < > 106   < > 106   < > 106   < > 107 107 106  CO2 30   <  > 28   < > 29   < > 29   < > 27* 29* 23*  GLUCOSE 96   < > 113*  --  100*  --  95  --   --   --   --   BUN 13   < > 12   < > 10   < > 13  --  18 13 15   CREATININE 0.91   < > 0.86   < > 0.79   < > 0.78  --  0.7 0.8 0.6  CALCIUM 8.8*   < > 8.8*   < > 8.7*   < > 8.7*   < > 8.8 8.4* 8.4*  MG 1.6*  --   --   --   --   --   --   --   --   --   --    < > = values in this interval not displayed.   Recent Labs    09/14/19 1126 10/15/19 1345 11/11/19 0000  AST 17 28 12*  ALT 15 38 13  ALKPHOS 95 89 91  BILITOT 1.2 0.6  --   PROT 6.8 6.2*  --   ALBUMIN 3.5 3.0* 3.4*   Recent Labs    09/14/19 1126 09/14/19 1126 09/20/19 0000 10/14/19 0000 10/15/19 1345  WBC 3.9*  --  5.2 3.9 3.6*  NEUTROABS 2.2  --  3  --  1.3*  HGB 12.6   < > 12.1 13.2 12.0  HCT 35.5*   < > 34* 38 34.2*  MCV 82.6  --   --   --  81.2  PLT 266   < > 235 312 281   < > = values in this interval not displayed.   Lab Results  Component Value Date   TSH 0.174 (L) 05/17/2015   Lab Results  Component Value Date   HGBA1C 5.2 09/15/2019   Lab Results  Component Value Date   CHOL 175 09/15/2019   HDL 53 09/15/2019   LDLCALC 114 (H) 09/15/2019   TRIG 42 09/15/2019   CHOLHDL 3.3 09/15/2019    Significant Diagnostic Results in last 30 days:  No results found.  Assessment/Plan  #1 history of pain management-with edema of the right side.  Per discussion above will start her on tramadol 50 mg every 8 hours as needed and monitor she does have orders for as needed Tylenol as well as as needed Robaxin in addition to Neurontin 300 mg 3 times daily.  She states tramadol has helped her in the past and was well-tolerated.  Will monitor.  2.  Right sided edema updated Dopplers are pending previous Dopplers have been negative for any DVT-her Lasix has just been increased to 40 mg a day and update metabolic panel was pending for early next week at this point continue to monitor.  3.  Hypertension this appears controlled  on current medications including Norvasc 10 mg a day Cardura 8 mg a day Lopressor 12.5 mg twice daily and clonidine 0.2 mg twice daily as well as Cozaar 100 mg a  day Recent blood pressures 111/78-127/85-116/72  #4 depression she is on Celexa 40 mg a day she also continues on clonazepam 0.5 mg 3 times daily for anxiety and has orders for Seroquel 50 mg nightly.--  For some history of psychotic features  Psych consult is pending-she does not appear to have acute depression but is frustrated with her progress which is understandable considering her CVA.  Will await psychiatry input and monitor  [phone]

## 2019-11-28 ENCOUNTER — Encounter: Payer: Self-pay | Admitting: Internal Medicine

## 2019-11-29 LAB — COMPREHENSIVE METABOLIC PANEL
Calcium: 8.4 — AB (ref 8.7–10.7)
GFR calc Af Amer: 90
GFR calc non Af Amer: 84.94

## 2019-11-29 LAB — BASIC METABOLIC PANEL
BUN: 13 (ref 4–21)
CO2: 28 — AB (ref 13–22)
Chloride: 106 (ref 99–108)
Creatinine: 0.7 (ref 0.5–1.1)
Glucose: 81
Potassium: 3.3 — AB (ref 3.4–5.3)
Sodium: 146 (ref 137–147)

## 2019-11-30 LAB — LIPID PANEL
Cholesterol: 92 (ref 0–200)
HDL: 42 (ref 35–70)
LDL Cholesterol: 38
LDl/HDL Ratio: 2.2
Triglycerides: 62 (ref 40–160)

## 2019-11-30 LAB — HEMOGLOBIN A1C: Hemoglobin A1C: 4.8

## 2019-11-30 LAB — HEPATIC FUNCTION PANEL
ALT: 18 (ref 7–35)
AST: 14 (ref 13–35)
Alkaline Phosphatase: 88 (ref 25–125)
Bilirubin, Total: 0.4

## 2019-11-30 LAB — BASIC METABOLIC PANEL
BUN: 14 (ref 4–21)
CO2: 29 — AB (ref 13–22)
Chloride: 106 (ref 99–108)
Creatinine: 0.8 (ref 0.5–1.1)
Glucose: 93
Potassium: 3.6 (ref 3.4–5.3)
Sodium: 147 (ref 137–147)

## 2019-11-30 LAB — COMPREHENSIVE METABOLIC PANEL
Albumin: 3.2 — AB (ref 3.5–5.0)
Calcium: 8.6 — AB (ref 8.7–10.7)
GFR calc Af Amer: 85.68
GFR calc non Af Amer: 73.93
Globulin: 2.6

## 2019-11-30 LAB — VITAMIN D 25 HYDROXY (VIT D DEFICIENCY, FRACTURES): Vit D, 25-Hydroxy: 39.7

## 2019-11-30 LAB — TSH: TSH: 0.62 (ref 0.41–5.90)

## 2019-12-06 ENCOUNTER — Non-Acute Institutional Stay (SKILLED_NURSING_FACILITY): Payer: Medicare Other | Admitting: Internal Medicine

## 2019-12-06 DIAGNOSIS — I639 Cerebral infarction, unspecified: Secondary | ICD-10-CM

## 2019-12-06 NOTE — Progress Notes (Signed)
Location:  [facility] Room Number: 201-D Place of Service:  SNF (31)  Wenda Low, MD  Patient Care Team: Wenda Low, MD as PCP - General (Internal Medicine) Wenda Low, MD (Internal Medicine)  Extended Emergency Contact Information Primary Emergency Contact: Green,Margie Address: [address]          [place], [place] [address] Johnnette Litter of [place] Phone: [phone] Mobile Phone: [phone] Relation: Mother Secondary Emergency Contact: Richardson,Cynthia  [place] Phone: [phone] Relation: Sister    Allergies: Codeine, Sulfa antibiotics, and Sulfa drugs cross reactors  Chief Complaint  Patient presents with  . Acute Visit    Patient is seen for a Care Plan Meeting    HPI: Patient is a 64 y.o. female who is being seen for care plan meeting.  In attendance are Marjory Lies, [place] [employer] and myself.  Past Medical History:  Diagnosis Date  . Anxiety    "Breakdown" in the 1990's, hosp. for >1 wk. at [facility]    . Anxiety   . Arthritis    OA- R knee, L hip   . Asthma    years ago. "no problems in a long while."  . Constipation   . Depression   . GERD (gastroesophageal reflux disease)   . Goiter    removed  at age 73  . Hypertension    all BP meds. managed by Dr. Deforest Hoyles    . Hypertension   . Incontinence of urine    weak bladder  . Pneumonia    while at [facility] 1990's told that she had pneumonia & was isolated   . Stroke ([place])    2005, R sided weakness   . Stroke [facility]) 2005   ? weakness in hand and arms per mother. patient unsure    Past Surgical History:  Procedure Laterality Date  . ABDOMINAL HYSTERECTOMY    . COLONOSCOPY    . DILATION AND CURETTAGE OF UTERUS     1980's  . goiter     removed age 46  . JOINT REPLACEMENT  10/2011   Rt knee  . JOINT REPLACEMENT Right    knee  . THYROIDECTOMY, PARTIAL    . TONSILLECTOMY    . TOTAL HIP ARTHROPLASTY   12/23/2011   Procedure: TOTAL HIP ARTHROPLASTY;  Surgeon: Kerin Salen, MD;  Location: [place];  Service: Orthopedics;  Laterality: Left;  DEPUY/PINNACLE CUPS/SROM STEM  . TOTAL HIP ARTHROPLASTY Left   . TOTAL HIP ARTHROPLASTY Right 02/15/2015   Procedure: TOTAL HIP ARTHROPLASTY;  Surgeon: Frederik Pear, MD;  Location: [place];  Service: Orthopedics;  Laterality: Right;  . TOTAL KNEE ARTHROPLASTY  10/21/2011   Procedure: TOTAL KNEE ARTHROPLASTY;  Surgeon: Kerin Salen, MD;  Location: [place];  Service: Orthopedics;  Laterality: Right;  DEPUY SIGMA RP  . TUBAL LIGATION      Allergies as of 12/06/2019      Reactions   Codeine Nausea Only, Other (See Comments)   headaches   Sulfa Antibiotics Nausea Only   Upset stomach, headaches   Sulfa Drugs Cross Reactors Other (See Comments)   Its been a long time ago      Medication List       Accurate as of December 06, 2019 11:59 PM. If you have any questions, ask your nurse or doctor.        STOP taking these medications   cetirizine 10 MG tablet Commonly known as: ZYRTEC  diphenhydrAMINE 25 MG tablet Commonly known as: BENADRYL   methocarbamol 500 MG tablet Commonly known as: ROBAXIN   neomycin-bacitracin-polymyxin [phone] ointment     TAKE these medications   acetaminophen 325 MG tablet Commonly known as: TYLENOL Take 650 mg by mouth every 8 (eight) hours.   acetaminophen 325 MG tablet Commonly known as: TYLENOL Take 2 tablets (650 mg total) by mouth every 6 (six) hours as needed for mild pain (or Fever >/= 101).   albuterol 108 (90 Base) MCG/ACT inhaler Commonly known as: VENTOLIN HFA Inhale 2 puffs into the lungs every 4 (four) hours as needed for wheezing or shortness of breath.   amLODipine 10 MG tablet Commonly known as: NORVASC Take 10 mg by mouth daily. For HTN   aspirin EC 325 MG tablet Take 1 tablet (325 mg total) by mouth 2 (two) times daily.   bisacodyl 10 MG suppository Commonly known as: DULCOLAX Place 10 mg  rectally daily as needed for moderate constipation.   Breo Ellipta 100-25 MCG/INH Aepb Generic drug: fluticasone furoate-vilanterol Inhale 1 puff into the lungs daily.   citalopram 40 MG tablet Commonly known as: CELEXA Take 40 mg by mouth daily.   clonazePAM 0.5 MG tablet Commonly known as: KLONOPIN Take 1 tablet (0.5 mg total) by mouth 3 (three) times daily. For Anxiety   cloNIDine 0.2 MG tablet Commonly known as: CATAPRES Take 1 tablet (0.2 mg total) by mouth 2 (two) times daily.   clopidogrel 75 MG tablet Commonly known as: PLAVIX Take 1 tablet (75 mg total) by mouth daily.   doxazosin 8 MG tablet Commonly known as: CARDURA Take 8 mg by mouth at bedtime.   FLAX SEED OIL PO Take 1,000 mg by mouth daily.   furosemide 40 MG tablet Commonly known as: LASIX Take 40 mg by mouth daily.   gabapentin 300 MG capsule Commonly known as: NEURONTIN Take 300 mg by mouth 3 (three) times daily.   ipratropium-albuterol 0.5-2.5 (3) MG/3ML Soln Commonly known as: DUONEB Take 3 mLs by nebulization every 6 (six) hours.   losartan 100 MG tablet Commonly known as: COZAAR Take 100 mg by mouth daily.   magnesium oxide 400 MG tablet Commonly known as: MAG-OX Take 400 mg by mouth daily.   metoprolol tartrate 25 MG tablet Commonly known as: LOPRESSOR Take 0.5 tablets (12.5 mg total) by mouth 2 (two) times daily.   montelukast 10 MG tablet Commonly known as: SINGULAIR Take 10 mg by mouth at bedtime.   NON FORMULARY Diet: Reg, NAS Heart Healthy   omeprazole 20 MG capsule Commonly known as: PRILOSEC Take 40 mg by mouth daily.   polyethylene glycol 17 g packet Commonly known as: MIRALAX / GLYCOLAX Take 17 g by mouth daily.   potassium chloride SA 20 MEQ tablet Commonly known as: KLOR-CON Take 20 mEq by mouth 2 (two) times daily.   QUEtiapine 50 MG tablet Commonly known as: SEROQUEL Take 1 tablet (50 mg total) by mouth at bedtime.   rosuvastatin 20 MG tablet Commonly  known as: CRESTOR Take 1 tablet (20 mg total) by mouth daily at 6 PM.   tiZANidine 2 MG tablet Commonly known as: ZANAFLEX Take 2 mg by mouth in the morning and at bedtime.   traMADol 50 MG tablet Commonly known as: ULTRAM Take 1 tablet (50 mg total) by mouth every 8 (eight) hours as needed for up to 14 days (For Pain X 2 weeks).   triamcinolone cream 0.1 % Commonly known as: KENALOG Apply 1  application topically daily as needed (Itching).   VITAMIN B 12 PO Take 1,000 mg by mouth daily.   vitamin C 500 MG tablet Commonly known as: ASCORBIC ACID Take 500 mg by mouth daily.   Vitamin D 50 MCG (2000 UT) tablet Take 2,000 Units by mouth daily.       No orders of the defined types were placed in this encounter.   Immunization History  Administered Date(s) Administered  . Influenza-Unspecified 05/31/2014, 07/01/2019  . PPD Test 03/01/2015    Social History   Tobacco Use  . Smoking status: Never Smoker  . Smokeless tobacco: Never Used  Substance Use Topics  . Alcohol use: Yes    Comment: 2x's /month, socially      Review of Systems   GENERAL:  no fevers, fatigue, appetite changes SKIN: No itching, rash HEENT: No complaint RESPIRATORY: No cough, wheezing, SOB CARDIAC: No chest pain, palpitations, lower extremity edema  GI: No abdominal pain, No N/V/D or constipation, No heartburn or reflux  GU: No dysuria, frequency or urgency, or incontinence  MUSCULOSKELETAL: No unrelieved bone/joint pain NEUROLOGIC: No headache, dizziness  PSYCHIATRIC: No overt anxiety or sadness  Vitals:   12/06/19 1245  BP: 116/80  Pulse: 63   There is no height or weight on file to calculate BMI. Physical Exam  GENERAL APPEARANCE: Alert, conversant, No acute distress  SKIN: No diaphoresis rash HEENT: Unremarkable RESPIRATORY: Breathing is even, unlabored. Lung sounds are clear   CARDIOVASCULAR: Heart RRR no murmurs, rubs or gallops.  Right upper peripheral edema    GASTROINTESTINAL: Abdomen is soft, non-tender, not distended w/ normal bowel sounds.  GENITOURINARY: Bladder non tender, not distended  MUSCULOSKELETAL: No abnormal joints or musculature NEUROLOGIC: Cranial nerves 2-12 grossly intact.;  Right-sided weakness; emotional lability has improved PSYCHIATRIC: Mood and affect appropriate to situation, no behavioral issues  Patient Active Problem List   Diagnosis Date Noted  . Urticaria 10/01/2019  . Wheezing 10/01/2019  . Hyperlipidemia 09/26/2019  . Hypomagnesemia 09/17/2019  . 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 Depression 09/15/2019  . 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 Mild cognitive impairment 03/11/2017  . Syncopal episodes 05/16/2015  . Acute renal injury (HCC) 05/16/2015  . Elevated troponin 05/16/2015  . Hypertension 05/16/2015  . Non compliance w medication regimen 05/16/2015  . Pulmonary nodule 05/16/2015  . Pressure ulcer 02/28/2015  . Dehydration 02/26/2015  . Intractable vomiting with nausea 02/26/2015  . Hypokalemia 02/25/2015  . Abdominal pain   . Primary osteoarthritis of right hip 02/15/2015  . Severe recurrent major depressive disorder with psychotic features (HCC) 09/01/2014  . GAD (generalized anxiety disorder) 09/01/2014  . PTSD (post-traumatic stress disorder) 09/01/2014  . Hip arthritis left 12/23/2011  . 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 10/21/2011    CMP     Component Value Date/Time   NA 145 11/20/2019 0000   K 3.6 11/20/2019 0000   CL 106 11/20/2019 0000   CO2 23 (A) 11/20/2019 0000   GLUCOSE 95 10/15/2019 1345   BUN 15 11/20/2019 0000   CREATININE 0.6 11/20/2019 0000   CREATININE 0.78 10/15/2019 1345   CALCIUM 8.4 (A) 11/20/2019 0000   PROT 6.2 (L) 10/15/2019 1345   ALBUMIN 3.4 (A) 11/11/2019 0000   AST 12 (A) 11/11/2019 0000   ALT 13 11/11/2019 0000   ALKPHOS 91 11/11/2019 0000   BILITOT 0.6 10/15/2019 1345   GFRNONAA 90 11/20/2019 0000   GFRAA 90 11/20/2019 0000   Recent Labs     09/15/19 0636  09/15/19 0636 09/16/19 0249 09/16/19 0249 09/17/19 0254 09/20/19 0000 10/15/19 1345 10/15/19 1345 11/11/19 0000 11/16/19 0000 11/20/19 0000  NA 146*   < > 145   < > 143   < > 144  --  145 145 145  K 4.0   < > 3.3*   < > 3.5   < > 3.7   < > 3.6 3.2* 3.6  CL 107   < > 106   < > 106   < > 106   < > 107 107 106  CO2 30   < > 28   < > 29   < > 29   < > 27* 29* 23*  GLUCOSE 96   < > 113*  --  100*  --  95  --   --   --   --   BUN 13   < > 12   < > 10   < > 13  --  18 13 15   CREATININE 0.91   < > 0.86   < > 0.79   < > 0.78  --  0.7 0.8 0.6  CALCIUM 8.8*   < > 8.8*   < > 8.7*   < > 8.7*   < > 8.8 8.4* 8.4*  MG 1.6*  --   --   --   --   --   --   --   --   --   --    < > = values in this interval not displayed.   Recent Labs    09/14/19 1126 10/15/19 1345 11/11/19 0000  AST 17 28 12*  ALT 15 38 13  ALKPHOS 95 89 91  BILITOT 1.2 0.6  --   PROT 6.8 6.2*  --   ALBUMIN 3.5 3.0* 3.4*   Recent Labs    09/14/19 1126 09/14/19 1126 09/20/19 0000 10/14/19 0000 10/15/19 1345  WBC 3.9*  --  5.2 3.9 3.6*  NEUTROABS 2.2  --  3  --  1.3*  HGB 12.6   < > 12.1 13.2 12.0  HCT 35.5*   < > 34* 38 34.2*  MCV 82.6  --   --   --  81.2  PLT 266   < > 235 312 281   < > = values in this interval not displayed.   Recent Labs    09/15/19 0636  CHOL 175  LDLCALC 114*  TRIG 42   No results found for: [facility] Lab Results  Component Value Date   TSH 0.174 (L) 05/17/2015   Lab Results  Component Value Date   HGBA1C 5.2 09/15/2019   Lab Results  Component Value Date   CHOL 175 09/15/2019   HDL 53 09/15/2019   LDLCALC 114 (H) 09/15/2019   TRIG 42 09/15/2019   CHOLHDL 3.3 09/15/2019    Significant Diagnostic Results in last 30 days:  No results found.  Assessment and Plan  Large vessel stroke with right-sided weakness--we are speaking to patient's daughter 09/17/2019; patient continues to be incontinent of urine, but she is being diuresed her weight is 222 pounds  in February and expected to drop secondary to diuresis.  Her skin is healthy.  She gets up every day and goes to the dining room also for activities; patient is a full code.  Overall patient is improving will have a follow-up meeting in 3 months   March, MD

## 2019-12-07 ENCOUNTER — Encounter: Payer: Self-pay | Admitting: Internal Medicine

## 2019-12-07 ENCOUNTER — Non-Acute Institutional Stay (SKILLED_NURSING_FACILITY): Payer: Medicare Other | Admitting: Internal Medicine

## 2019-12-07 DIAGNOSIS — M79604 Pain in right leg: Secondary | ICD-10-CM | POA: Diagnosis not present

## 2019-12-07 DIAGNOSIS — F333 Major depressive disorder, recurrent, severe with psychotic symptoms: Secondary | ICD-10-CM | POA: Diagnosis not present

## 2019-12-07 DIAGNOSIS — I639 Cerebral infarction, unspecified: Secondary | ICD-10-CM

## 2019-12-07 NOTE — Progress Notes (Signed)
Location:  [facility] Room Number: 201-D Place of Service:  SNF (31)  Georgann Housekeeper, MD  Patient Care Team: Georgann Housekeeper, MD as PCP - General (Internal Medicine) Georgann Housekeeper, MD (Internal Medicine)  Extended Emergency Contact Information Primary Emergency Contact: Green,Margie Address: [address]          [place], [place] [address] Darden Amber of [place] Home Phone: [phone] Mobile Phone: [phone] Relation: Mother Secondary Emergency Contact: Richardson,Cynthia  [place] Home Phone: [phone] Relation: Sister    Allergies: Codeine, Sulfa antibiotics, and Sulfa drugs cross reactors  Chief Complaint  Patient presents with  . Medical Management of Chronic Issues    Routine [facility] visit    HPI: Patient is 64 y.o. female who is being seen for routine issues of large vessel CVA, right leg pain, and depression with psychoses.  Past Medical History:  Diagnosis Date  . Anxiety    "Breakdown" in the 1990's, hosp. for >1 wk. at [facility]    . Anxiety   . Arthritis    OA- R knee, L hip   . Asthma    years ago. "no problems in a long while."  . Constipation   . Depression   . GERD (gastroesophageal reflux disease)   . Goiter    removed  at age 24  . Hypertension    all BP meds. managed by Dr. Eula Listen    . Hypertension   . Incontinence of urine    weak bladder  . Pneumonia    while at [facility] 1990's told that she had pneumonia & was isolated   . Stroke ([facility])    2005, R sided weakness   . Stroke [facility]) 2005   ? weakness in hand and arms per mother. patient unsure    Past Surgical History:  Procedure Laterality Date  . ABDOMINAL HYSTERECTOMY    . COLONOSCOPY    . DILATION AND CURETTAGE OF UTERUS     1980's  . goiter     removed age 68  . JOINT REPLACEMENT  10/2011   Rt knee  . JOINT REPLACEMENT Right    knee  . THYROIDECTOMY, PARTIAL    . TONSILLECTOMY    . TOTAL HIP ARTHROPLASTY   12/23/2011   Procedure: TOTAL HIP ARTHROPLASTY;  Surgeon: Nestor Lewandowsky, MD;  Location: [facility];  Service: Orthopedics;  Laterality: Left;  DEPUY/PINNACLE CUPS/SROM STEM  . TOTAL HIP ARTHROPLASTY Left   . TOTAL HIP ARTHROPLASTY Right 02/15/2015   Procedure: TOTAL HIP ARTHROPLASTY;  Surgeon: Gean Birchwood, MD;  Location: [facility];  Service: Orthopedics;  Laterality: Right;  . TOTAL KNEE ARTHROPLASTY  10/21/2011   Procedure: TOTAL KNEE ARTHROPLASTY;  Surgeon: Nestor Lewandowsky, MD;  Location: [facility];  Service: Orthopedics;  Laterality: Right;  DEPUY SIGMA RP  . TUBAL LIGATION      Allergies as of 12/07/2019      Reactions   Codeine Nausea Only, Other (See Comments)   headaches   Sulfa Antibiotics Nausea Only   Upset stomach, headaches   Sulfa Drugs Cross Reactors Other (See Comments)   Its been a long time ago      Medication List       Accurate as of December 07, 2019 11:59 PM. If you have any questions, ask your nurse or doctor.        acetaminophen 325 MG tablet Commonly known as: TYLENOL Take 650 mg by mouth every 8 (eight) hours.  acetaminophen 325 MG tablet Commonly known as: TYLENOL Take 2 tablets (650 mg total) by mouth every 6 (six) hours as needed for mild pain (or Fever >/= 101).   albuterol 108 (90 Base) MCG/ACT inhaler Commonly known as: VENTOLIN HFA Inhale 2 puffs into the lungs every 4 (four) hours as needed for wheezing or shortness of breath.   amLODipine 10 MG tablet Commonly known as: NORVASC Take 10 mg by mouth daily. For HTN   aspirin EC 325 MG tablet Take 1 tablet (325 mg total) by mouth 2 (two) times daily.   bisacodyl 10 MG suppository Commonly known as: DULCOLAX Place 10 mg rectally daily as needed for moderate constipation.   Breo Ellipta 100-25 MCG/INH Aepb Generic drug: fluticasone furoate-vilanterol Inhale 1 puff into the lungs daily.   citalopram 40 MG tablet Commonly known as: CELEXA Take 40 mg by mouth daily.   clonazePAM 0.5 MG tablet Commonly  known as: KLONOPIN Take 1 tablet (0.5 mg total) by mouth 3 (three) times daily. For Anxiety   cloNIDine 0.2 MG tablet Commonly known as: CATAPRES Take 1 tablet (0.2 mg total) by mouth 2 (two) times daily.   clopidogrel 75 MG tablet Commonly known as: PLAVIX Take 1 tablet (75 mg total) by mouth daily.   doxazosin 8 MG tablet Commonly known as: CARDURA Take 8 mg by mouth at bedtime.   FLAX SEED OIL PO Take 1,000 mg by mouth daily.   furosemide 40 MG tablet Commonly known as: LASIX Take 40 mg by mouth daily.   gabapentin 300 MG capsule Commonly known as: NEURONTIN Take 300 mg by mouth 3 (three) times daily.   ipratropium-albuterol 0.5-2.5 (3) MG/3ML Soln Commonly known as: DUONEB Take 3 mLs by nebulization every 6 (six) hours.   losartan 100 MG tablet Commonly known as: COZAAR Take 100 mg by mouth daily.   magnesium oxide 400 MG tablet Commonly known as: MAG-OX Take 400 mg by mouth daily.   metoprolol tartrate 25 MG tablet Commonly known as: LOPRESSOR Take 0.5 tablets (12.5 mg total) by mouth 2 (two) times daily.   montelukast 10 MG tablet Commonly known as: SINGULAIR Take 10 mg by mouth at bedtime.   NON FORMULARY Diet: Reg, NAS Heart Healthy   omeprazole 20 MG capsule Commonly known as: PRILOSEC Take 40 mg by mouth daily.   polyethylene glycol 17 g packet Commonly known as: MIRALAX / GLYCOLAX Take 17 g by mouth daily.   potassium chloride SA 20 MEQ tablet Commonly known as: KLOR-CON Take 20 mEq by mouth 2 (two) times daily.   QUEtiapine 50 MG tablet Commonly known as: SEROQUEL Take 1 tablet (50 mg total) by mouth at bedtime.   rosuvastatin 20 MG tablet Commonly known as: CRESTOR Take 1 tablet (20 mg total) by mouth daily at 6 PM.   tiZANidine 2 MG tablet Commonly known as: ZANAFLEX Take 2 mg by mouth in the morning and at bedtime.   traMADol 50 MG tablet Commonly known as: ULTRAM Take 1 tablet (50 mg total) by mouth every 8 (eight) hours as  needed for up to 14 days (For Pain X 2 weeks).   triamcinolone cream 0.1 % Commonly known as: KENALOG Apply 1 application topically daily as needed (Itching).   VITAMIN B 12 PO Take 1,000 mg by mouth daily.   vitamin C 500 MG tablet Commonly known as: ASCORBIC ACID Take 500 mg by mouth daily.   Vitamin D 50 MCG (2000 UT) tablet Take 2,000 Units by mouth daily.  No orders of the defined types were placed in this encounter.   Immunization History  Administered Date(s) Administered  . Influenza-Unspecified 05/31/2014, 07/01/2019  . PPD Test 03/01/2015  . Pneumococcal Conjugate-13 09/17/2019    Social History   Tobacco Use  . Smoking status: Never Smoker  . Smokeless tobacco: Never Used  Substance Use Topics  . Alcohol use: Yes    Comment: 2x's /month, socially      Review of Systems   GENERAL:  no fevers, fatigue, appetite changes SKIN: No itching, rash HEENT: No complaint RESPIRATORY: No cough, wheezing, SOB CARDIAC: No chest pain, palpitations, lower extremity edema  GI: No abdominal pain, No N/V/D or constipation, No heartburn or reflux  GU: No dysuria, frequency or urgency, or incontinence  MUSCULOSKELETAL: No unrelieved bone/joint pain NEUROLOGIC: No headache, dizziness  PSYCHIATRIC: No overt anxiety or sadness  Vitals:   12/07/19 1211  BP: 116/80  Pulse: 63  Resp: 20  Temp: 98 F (36.7 C)  SpO2: 97%   Body mass index is 41.95 kg/m. Physical Exam  GENERAL APPEARANCE: Alert, conversant, No acute distress  SKIN: No diaphoresis rash HEENT: Unremarkable RESPIRATORY: Breathing is even, unlabored. Lung sounds are clear   CARDIOVASCULAR: Heart RRR no murmurs, rubs or gallops.  Right upper extremity edema and 1+ lower extremity and peripheral edema  GASTROINTESTINAL: Abdomen is soft, non-tender, not distended w/ normal bowel sounds.  GENITOURINARY: Bladder non tender, not distended  MUSCULOSKELETAL: No abnormal joints or musculature NEUROLOGIC:  Cranial nerves 2-12 grossly intact.  Right-sided hemiplegia dysarthria has improved PSYCHIATRIC: Mood and affect appropriate to situation, emotional lability has improved; no behavioral issues  Patient Active Problem List   Diagnosis Date Noted  . Stroke, large vessel (HCC) 12/12/2019  . Leg pain 12/12/2019  . Urticaria 10/01/2019  . Wheezing 10/01/2019  . Hyperlipidemia 09/26/2019  . Hypomagnesemia 09/17/2019  . 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 Depression 09/15/2019  . 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 Mild cognitive impairment 03/11/2017  . Syncopal episodes 05/16/2015  . Acute renal injury (HCC) 05/16/2015  . Elevated troponin 05/16/2015  . Hypertension 05/16/2015  . Non compliance w medication regimen 05/16/2015  . Pulmonary nodule 05/16/2015  . Pressure ulcer 02/28/2015  . Dehydration 02/26/2015  . Intractable vomiting with nausea 02/26/2015  . Hypokalemia 02/25/2015  . Abdominal pain   . Primary osteoarthritis of right hip 02/15/2015  . Severe recurrent major depressive disorder with psychotic features (HCC) 09/01/2014  . GAD (generalized anxiety disorder) 09/01/2014  . PTSD (post-traumatic stress disorder) 09/01/2014  . Hip arthritis left 12/23/2011  . 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 10/21/2011    CMP     Component Value Date/Time   NA 145 11/20/2019 0000   K 3.6 11/20/2019 0000   CL 106 11/20/2019 0000   CO2 23 (A) 11/20/2019 0000   GLUCOSE 95 10/15/2019 1345   BUN 15 11/20/2019 0000   CREATININE 0.6 11/20/2019 0000   CREATININE 0.78 10/15/2019 1345   CALCIUM 8.4 (A) 11/20/2019 0000   PROT 6.2 (L) 10/15/2019 1345   ALBUMIN 3.4 (A) 11/11/2019 0000   AST 12 (A) 11/11/2019 0000   ALT 13 11/11/2019 0000   ALKPHOS 91 11/11/2019 0000   BILITOT 0.6 10/15/2019 1345   GFRNONAA 90 11/20/2019 0000   GFRAA 90 11/20/2019 0000   Recent Labs    09/15/19 0636 09/15/19 0636 09/16/19 0249 09/16/19 0249 09/17/19 0254 09/20/19 0000 10/15/19 1345  10/15/19 1345 11/11/19 0000 11/16/19 0000 11/20/19 0000  NA 146*   < >  145   < > 143   < > 144  --  145 145 145  K 4.0   < > 3.3*   < > 3.5   < > 3.7   < > 3.6 3.2* 3.6  CL 107   < > 106   < > 106   < > 106   < > 107 107 106  CO2 30   < > 28   < > 29   < > 29   < > 27* 29* 23*  GLUCOSE 96   < > 113*  --  100*  --  95  --   --   --   --   BUN 13   < > 12   < > 10   < > 13  --  18 13 15   CREATININE 0.91   < > 0.86   < > 0.79   < > 0.78  --  0.7 0.8 0.6  CALCIUM 8.8*   < > 8.8*   < > 8.7*   < > 8.7*   < > 8.8 8.4* 8.4*  MG 1.6*  --   --   --   --   --   --   --   --   --   --    < > = values in this interval not displayed.   Recent Labs    09/14/19 1126 10/15/19 1345 11/11/19 0000  AST 17 28 12*  ALT 15 38 13  ALKPHOS 95 89 91  BILITOT 1.2 0.6  --   PROT 6.8 6.2*  --   ALBUMIN 3.5 3.0* 3.4*   Recent Labs    09/14/19 1126 09/14/19 1126 09/20/19 0000 10/14/19 0000 10/15/19 1345  WBC 3.9*  --  5.2 3.9 3.6*  NEUTROABS 2.2  --  3  --  1.3*  HGB 12.6   < > 12.1 13.2 12.0  HCT 35.5*   < > 34* 38 34.2*  MCV 82.6  --   --   --  81.2  PLT 266   < > 235 312 281   < > = values in this interval not displayed.   Recent Labs    09/15/19 0636  CHOL 175  LDLCALC 114*  TRIG 42   No results found for: [facility] Lab Results  Component Value Date   TSH 0.174 (L) 05/17/2015   Lab Results  Component Value Date   HGBA1C 5.2 09/15/2019   Lab Results  Component Value Date   CHOL 175 09/15/2019   HDL 53 09/15/2019   LDLCALC 114 (H) 09/15/2019   TRIG 42 09/15/2019   CHOLHDL 3.3 09/15/2019    Significant Diagnostic Results in last 30 days:  No results found.  Assessment and Plan  Stroke, large vessel ([place]) Patient has marked improvement in her emotional lability and overall mood and speech; right-sided weakness however patient is unchanged; continue ASA 325 mg daily and Plavix 75 mg daily  Leg pain Some relief; continue Neurontin 300 mg 3 times daily  Severe recurrent  major depressive disorder with psychotic features Appears improved; continue Celexa 40 mg daily and Seroquel 50 mg nightly    Hennie Duos, MD

## 2019-12-12 ENCOUNTER — Encounter: Payer: Self-pay | Admitting: Internal Medicine

## 2019-12-12 DIAGNOSIS — M79606 Pain in leg, unspecified: Secondary | ICD-10-CM | POA: Insufficient documentation

## 2019-12-12 DIAGNOSIS — I639 Cerebral infarction, unspecified: Secondary | ICD-10-CM | POA: Insufficient documentation

## 2019-12-12 NOTE — Assessment & Plan Note (Signed)
Appears improved; continue Celexa 40 mg daily and Seroquel 50 mg nightly

## 2019-12-12 NOTE — Assessment & Plan Note (Signed)
Some relief; continue Neurontin 300 mg 3 times daily

## 2019-12-12 NOTE — Assessment & Plan Note (Addendum)
Patient has marked improvement in her emotional lability and overall mood and speech; right-sided weakness however patient is unchanged; continue ASA 325 mg daily and Plavix 75 mg daily

## 2020-01-11 ENCOUNTER — Encounter: Payer: Self-pay | Admitting: Internal Medicine

## 2020-01-11 ENCOUNTER — Non-Acute Institutional Stay (SKILLED_NURSING_FACILITY): Payer: Medicare Other | Admitting: Internal Medicine

## 2020-01-11 DIAGNOSIS — G629 Polyneuropathy, unspecified: Secondary | ICD-10-CM

## 2020-01-11 DIAGNOSIS — M1611 Unilateral primary osteoarthritis, right hip: Secondary | ICD-10-CM | POA: Diagnosis not present

## 2020-01-11 DIAGNOSIS — E785 Hyperlipidemia, unspecified: Secondary | ICD-10-CM | POA: Diagnosis not present

## 2020-01-11 NOTE — Progress Notes (Signed)
Location:  [facility] Room Number: 201-D Place of Service:  SNF (31)  Georgann Housekeeper, MD  Patient Care Team: Georgann Housekeeper, MD as PCP - General (Internal Medicine) Georgann Housekeeper, MD (Internal Medicine)  Extended Emergency Contact Information Primary Emergency Contact: Green,Margie Address: [address]          [place], [place] [address] [place] Home Phone: [phone] Mobile Phone: [phone] Relation: Mother Secondary Emergency Contact: Richardson,Cynthia  [place] Home Phone: [phone] Relation: Sister    Allergies: Codeine, Sulfa antibiotics, and Sulfa drugs cross reactors  Chief Complaint  Patient presents with  . Medical Management of Chronic Issues    Routine [facility] visit    HPI: Patient is 64 y.o. female who is being seen for routine issues of osteoarthritis of hip, hyperlipidemia, and polyneuropathy of affected side of stroke.  Past Medical History:  Diagnosis Date  . Anxiety    "Breakdown" in the 1990's, hosp. for >1 wk. at [facility]    . Anxiety   . Arthritis    OA- R knee, L hip   . Asthma    years ago. "no problems in a long while."  . Constipation   . Depression   . GERD (gastroesophageal reflux disease)   . Goiter    removed  at age 67  . Hypertension    all BP meds. managed by Dr. Eula Listen    . Hypertension   . Incontinence of urine    weak bladder  . Pneumonia    while at [facility].- 1990's told that she had pneumonia & was isolated   . Polyneuropathy 01/16/2020  . Stroke (HCC)    2005, R sided weakness   . Stroke [facility]) 2005   ? weakness in hand and arms per mother. patient unsure    Past Surgical History:  Procedure Laterality Date  . ABDOMINAL HYSTERECTOMY    . COLONOSCOPY    . DILATION AND CURETTAGE OF UTERUS     1980's  . goiter     removed age 39  . JOINT REPLACEMENT  10/2011   Rt knee  . JOINT REPLACEMENT Right    knee  . THYROIDECTOMY, PARTIAL     . TONSILLECTOMY    . TOTAL HIP ARTHROPLASTY  12/23/2011   Procedure: TOTAL HIP ARTHROPLASTY;  Surgeon: Nestor Lewandowsky, MD;  Location: [facility] OR;  Service: Orthopedics;  Laterality: Left;  DEPUY/PINNACLE CUPS/SROM STEM  . TOTAL HIP ARTHROPLASTY Left   . TOTAL HIP ARTHROPLASTY Right 02/15/2015   Procedure: TOTAL HIP ARTHROPLASTY;  Surgeon: Gean Birchwood, MD;  Location: [facility] OR;  Service: Orthopedics;  Laterality: Right;  . TOTAL KNEE ARTHROPLASTY  10/21/2011   Procedure: TOTAL KNEE ARTHROPLASTY;  Surgeon: Nestor Lewandowsky, MD;  Location: [facility] OR;  Service: Orthopedics;  Laterality: Right;  DEPUY SIGMA RP  . TUBAL LIGATION      Allergies as of 01/11/2020      Reactions   Codeine Nausea Only, Other (See Comments)   headaches   Sulfa Antibiotics Nausea Only   Upset stomach, headaches   Sulfa Drugs Cross Reactors Other (See Comments)   Its been a long time ago      Medication List       Accurate as of January 11, 2020 11:59 PM. If you have any questions, ask your nurse or doctor.        STOP taking these medications   ipratropium-albuterol 0.5-2.5 (3) MG/3ML Soln Commonly known  as: DUONEB Stopped by: Inocencio Homes, MD     TAKE these medications   acetaminophen 325 MG tablet Commonly known as: TYLENOL Take 650 mg by mouth every 8 (eight) hours.   acetaminophen 325 MG tablet Commonly known as: TYLENOL Take 2 tablets (650 mg total) by mouth every 6 (six) hours as needed for mild pain (or Fever >/= 101).   albuterol 108 (90 Base) MCG/ACT inhaler Commonly known as: VENTOLIN HFA Inhale 2 puffs into the lungs every 4 (four) hours as needed for wheezing or shortness of breath.   amLODipine 10 MG tablet Commonly known as: NORVASC Take 10 mg by mouth daily. For HTN   aspirin EC 325 MG tablet Take 1 tablet (325 mg total) by mouth 2 (two) times daily.   bisacodyl 10 MG suppository Commonly known as: DULCOLAX Place 10 mg rectally daily as needed for moderate constipation.   Breo Ellipta 100-25  MCG/INH Aepb Generic drug: fluticasone furoate-vilanterol Inhale 1 puff into the lungs daily.   citalopram 40 MG tablet Commonly known as: CELEXA Take 40 mg by mouth daily.   clonazePAM 0.5 MG tablet Commonly known as: KLONOPIN Take 1 tablet (0.5 mg total) by mouth 3 (three) times daily. For Anxiety   cloNIDine 0.1 MG tablet Commonly known as: CATAPRES Take 0.1 mg by mouth 2 (two) times daily. Hold for SBP less than 130. What changed: Another medication with the same name was removed. Continue taking this medication, and follow the directions you see here. Changed by: Inocencio Homes, MD   clopidogrel 75 MG tablet Commonly known as: PLAVIX Take 1 tablet (75 mg total) by mouth daily.   doxazosin 8 MG tablet Commonly known as: CARDURA Take 8 mg by mouth at bedtime.   FLAX SEED OIL PO Take 1,000 mg by mouth daily.   furosemide 40 MG tablet Commonly known as: LASIX Take 40 mg by mouth daily.   gabapentin 300 MG capsule Commonly known as: NEURONTIN Take 300 mg by mouth 3 (three) times daily.   losartan 100 MG tablet Commonly known as: COZAAR Take 100 mg by mouth daily.   magnesium oxide 400 MG tablet Commonly known as: MAG-OX Take 400 mg by mouth daily.   metoprolol tartrate 25 MG tablet Commonly known as: LOPRESSOR Take 0.5 tablets (12.5 mg total) by mouth 2 (two) times daily.   montelukast 10 MG tablet Commonly known as: SINGULAIR Take 10 mg by mouth at bedtime.   NON FORMULARY Diet: Reg, NAS Heart Healthy   omeprazole 20 MG capsule Commonly known as: PRILOSEC Take 40 mg by mouth daily.   polyethylene glycol 17 g packet Commonly known as: MIRALAX / GLYCOLAX Take 17 g by mouth daily.   potassium chloride SA 20 MEQ tablet Commonly known as: KLOR-CON Take 20 mEq by mouth 2 (two) times daily.   QUEtiapine 50 MG tablet Commonly known as: SEROQUEL Take 1 tablet (50 mg total) by mouth at bedtime.   rosuvastatin 20 MG tablet Commonly known as:  CRESTOR Take 1 tablet (20 mg total) by mouth daily at 6 PM.   tiZANidine 2 MG tablet Commonly known as: ZANAFLEX Take 2 mg by mouth in the morning and at bedtime.   traMADol 50 MG tablet Commonly known as: ULTRAM Take 50 mg by mouth daily.   triamcinolone cream 0.1 % Commonly known as: KENALOG Apply 1 application topically daily as needed (Itching).   VITAMIN B 12 PO Take 1,000 mg by mouth daily.   vitamin C 500 MG tablet  Commonly known as: ASCORBIC ACID Take 500 mg by mouth daily.   Vitamin D 50 MCG (2000 UT) tablet Take 2,000 Units by mouth daily.       No orders of the defined types were placed in this encounter.   Immunization History  Administered Date(s) Administered  . Influenza-Unspecified 05/31/2014, 07/01/2019  . PPD Test 03/01/2015  . Pneumococcal Conjugate-13 09/17/2019    Social History   Tobacco Use  . Smoking status: Never Smoker  . Smokeless tobacco: Never Used  Substance Use Topics  . Alcohol use: Yes    Comment: 2x's /month, socially      Review of Systems  GENERAL:  no fevers, fatigue, appetite changes SKIN: No itching, rash HEENT: No complaint RESPIRATORY: No cough, wheezing, SOB CARDIAC: No chest pain, palpitations, lower extremity edema  GI: No abdominal pain, No N/V/D or constipation, No heartburn or reflux  GU: No dysuria, frequency or urgency, or incontinence  MUSCULOSKELETAL: No unrelieved bone/joint pain NEUROLOGIC: No headache, dizziness  PSYCHIATRIC: No overt anxiety or sadness  Vitals:   01/11/20 1501  BP: 134/76  Pulse: 68  Resp: 17  Temp: (!) 97.5 F (36.4 C)   Body mass index is 42.19 kg/m. Physical Exam  GENERAL APPEARANCE: Alert, conversant, No acute distress  SKIN: No diaphoresis rash HEENT: Unremarkable RESPIRATORY: Breathing is even, unlabored. Lung sounds are clear   CARDIOVASCULAR: Heart RRR no murmurs, rubs or gallops. No peripheral edema  GASTROINTESTINAL: Abdomen is soft, non-tender, not distended  w/ normal bowel sounds.  GENITOURINARY: Bladder non tender, not distended  MUSCULOSKELETAL: No abnormal joints or musculature NEUROLOGIC: Cranial nerves 2-12 grossly intact.  Right-sided weakness PSYCHIATRIC: Mood and affect appropriate to situation, no behavioral issues and emotions are less labile than prior  Patient Active Problem List   Diagnosis Date Noted  . Polyneuropathy 01/16/2020  . Stroke, large vessel (HCC) 12/12/2019  . Leg pain 12/12/2019  . Urticaria 10/01/2019  . Wheezing 10/01/2019  . Hyperlipidemia 09/26/2019  . Hypomagnesemia 09/17/2019  . 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 Depression 09/15/2019  . 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 Mild cognitive impairment 03/11/2017  . Syncopal episodes 05/16/2015  . Acute renal injury (HCC) 05/16/2015  . Elevated troponin 05/16/2015  . Hypertension 05/16/2015  . Non compliance w medication regimen 05/16/2015  . Pulmonary nodule 05/16/2015  . Pressure ulcer 02/28/2015  . Dehydration 02/26/2015  . Intractable vomiting with nausea 02/26/2015  . Hypokalemia 02/25/2015  . Abdominal pain   . Primary osteoarthritis of right hip 02/15/2015  . Severe recurrent major depressive disorder with psychotic features (HCC) 09/01/2014  . GAD (generalized anxiety disorder) 09/01/2014  . PTSD (post-traumatic stress disorder) 09/01/2014  . Hip arthritis left 12/23/2011  . 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 10/21/2011    CMP     Component Value Date/Time   NA 147 11/30/2019 0000   K 3.6 11/30/2019 0000   CL 106 11/30/2019 0000   CO2 29 (A) 11/30/2019 0000   GLUCOSE 95 10/15/2019 1345   BUN 14 11/30/2019 0000   CREATININE 0.8 11/30/2019 0000   CREATININE 0.78 10/15/2019 1345   CALCIUM 8.6 (A) 11/30/2019 0000   PROT 6.2 (L) 10/15/2019 1345   ALBUMIN 3.2 (A) 11/30/2019 0000   AST 14 11/30/2019 0000   ALT 18 11/30/2019 0000   ALKPHOS 88 11/30/2019 0000   BILITOT 0.6 10/15/2019 1345   GFRNONAA 73.93 11/30/2019 0000    GFRAA 85.68 11/30/2019 0000   Recent Labs    09/15/19 0636 09/15/19  [id number] 09/16/19 0249 09/16/19 0249 09/17/19 0254 09/20/19 0000 10/15/19 1345 11/11/19 0000 11/20/19 0000 11/29/19 0000 11/30/19 0000  NA 146*   < > 145   < > 143   < > 144   < > 145 146 147  K 4.0   < > 3.3*   < > 3.5   < > 3.7   < > 3.6 3.3* 3.6  CL 107   < > 106   < > 106   < > 106   < > 106 106 106  CO2 30   < > 28   < > 29   < > 29   < > 23* 28* 29*  GLUCOSE 96   < > 113*  --  100*  --  95  --   --   --   --   BUN 13   < > 12   < > 10   < > 13   < > 15 13 14   CREATININE 0.91   < > 0.86   < > 0.79   < > 0.78   < > 0.6 0.7 0.8  CALCIUM 8.8*   < > 8.8*   < > 8.7*   < > 8.7*   < > 8.4* 8.4* 8.6*  MG 1.6*  --   --   --   --   --   --   --   --   --   --    < > = values in this interval not displayed.   Recent Labs    09/14/19 1126 09/14/19 1126 10/15/19 1345 11/11/19 0000 11/30/19 0000  AST 17   < > 28 12* 14  ALT 15   < > 38 13 18  ALKPHOS 95   < > 89 91 88  BILITOT 1.2  --  0.6  --   --   PROT 6.8  --  6.2*  --   --   ALBUMIN 3.5   < > 3.0* 3.4* 3.2*   < > = values in this interval not displayed.   Recent Labs    09/14/19 1126 09/14/19 1126 09/20/19 0000 10/14/19 0000 10/15/19 1345  WBC 3.9*  --  5.2 3.9 3.6*  NEUTROABS 2.2  --  3  --  1.3*  HGB 12.6   < > 12.1 13.2 12.0  HCT 35.5*   < > 34* 38 34.2*  MCV 82.6  --   --   --  81.2  PLT 266   < > 235 312 281   < > = values in this interval not displayed.   Recent Labs    09/15/19 0636 11/30/19 0000  CHOL 175 92  LDLCALC 114* 38  TRIG 42 62   No results found for: [facility] Lab Results  Component Value Date   TSH 0.62 11/30/2019   Lab Results  Component Value Date   HGBA1C 4.8 11/30/2019   Lab Results  Component Value Date   CHOL 92 11/30/2019   HDL 42 11/30/2019   LDLCALC 38 11/30/2019   TRIG 62 11/30/2019   CHOLHDL 3.3 09/15/2019    Significant Diagnostic Results in last 30 days:  No results found.  Assessment and  Plan  Primary osteoarthritis of right hip Appears controlled; continue Tylenol 650 mg every 8 hours scheduled  Hyperlipidemia LDL 38 good control; continue Lipitor 20 mg daily  Polyneuropathy With pain to right side, the affected side of stroke; continue Neurontin 300 mg  3 times daily     Margit Hanks, MD

## 2020-01-16 ENCOUNTER — Encounter: Payer: Self-pay | Admitting: Internal Medicine

## 2020-01-16 DIAGNOSIS — G629 Polyneuropathy, unspecified: Secondary | ICD-10-CM

## 2020-01-16 HISTORY — DX: Polyneuropathy, unspecified: G62.9

## 2020-01-16 NOTE — Assessment & Plan Note (Signed)
LDL 38 good control; continue Lipitor 20 mg daily

## 2020-01-16 NOTE — Assessment & Plan Note (Signed)
Appears controlled; continue Tylenol 650 mg every 8 hours scheduled

## 2020-01-16 NOTE — Assessment & Plan Note (Signed)
With pain to right side, the affected side of stroke; continue Neurontin 300 mg 3 times daily

## 2020-05-06 LAB — VITAMIN D 25 HYDROXY (VIT D DEFICIENCY, FRACTURES): Vit D, 25-Hydroxy: 56.6

## 2020-05-06 LAB — COMPREHENSIVE METABOLIC PANEL
Albumin: 3.9 (ref 3.5–5.0)
Calcium: 8.9 (ref 8.7–10.7)
GFR calc Af Amer: >90
GFR calc non Af Amer: >90
Globulin: 2.4

## 2020-05-06 LAB — BASIC METABOLIC PANEL
BUN: 13 (ref 4–21)
CO2: 29 — AB (ref 13–22)
Chloride: 103 (ref 99–108)
Creatinine: 0.6 (ref 0.5–1.1)
Glucose: 73
Potassium: 3.1 — AB (ref 3.4–5.3)
Sodium: 144 (ref 137–147)

## 2020-05-06 LAB — CBC: RBC: 4.27 (ref 3.87–5.11)

## 2020-05-06 LAB — HEPATIC FUNCTION PANEL
ALT: 13 (ref 7–35)
AST: 14 (ref 13–35)
Alkaline Phosphatase: 121 (ref 25–125)
Bilirubin, Total: 0.8

## 2020-05-06 LAB — LIPID PANEL
Cholesterol: 125 (ref 0–200)
HDL: 64 (ref 35–70)
LDL Cholesterol: 45
Triglycerides: 61 (ref 40–160)

## 2020-05-06 LAB — CBC AND DIFFERENTIAL
HCT: 36 (ref 36–46)
Hemoglobin: 12.7 (ref 12.0–16.0)
Platelets: 216 (ref 150–399)
WBC: 3.7

## 2020-05-19 ENCOUNTER — Encounter: Payer: Self-pay | Admitting: Internal Medicine

## 2020-05-19 ENCOUNTER — Non-Acute Institutional Stay (SKILLED_NURSING_FACILITY): Payer: Medicare Other | Admitting: Internal Medicine

## 2020-05-19 DIAGNOSIS — Z789 Other specified health status: Secondary | ICD-10-CM

## 2020-05-19 DIAGNOSIS — I6930 Unspecified sequelae of cerebral infarction: Secondary | ICD-10-CM | POA: Diagnosis not present

## 2020-05-19 DIAGNOSIS — Z1159 Encounter for screening for other viral diseases: Secondary | ICD-10-CM | POA: Diagnosis not present

## 2020-05-19 DIAGNOSIS — M1611 Unilateral primary osteoarthritis, right hip: Secondary | ICD-10-CM | POA: Diagnosis not present

## 2020-05-19 DIAGNOSIS — F333 Major depressive disorder, recurrent, severe with psychotic symptoms: Secondary | ICD-10-CM

## 2020-05-19 DIAGNOSIS — G629 Polyneuropathy, unspecified: Secondary | ICD-10-CM

## 2020-05-19 DIAGNOSIS — Z23 Encounter for immunization: Secondary | ICD-10-CM

## 2020-05-19 LAB — MAGNESIUM

## 2020-05-19 NOTE — Progress Notes (Signed)
Location:  [facility] Room Number: 201/D Place of Service:  SNF (31) Provider:  Antuan Limes L. Renato Gails, D.O., C.M.D.  Kermit Balo, DO  Patient Care Team: Kermit Balo, DO as PCP - General (Geriatric Medicine) Rehab, [name]  Extended Emergency Contact Information Primary Emergency Contact: Green,Margie Address: [address]          [place], [place] [address] [place] Home Phone: [phone] Mobile Phone: [phone] Relation: Mother Secondary Emergency Contact: Evern Bio [place] Home Phone: [phone] Relation: Sister  Code Status:  FULL CODE Goals of care: Advanced Directive information Advanced Directives 05/19/2020  Does Patient Have a Medical Advance Directive? Yes  Type of Advance Directive (No Data)  Does patient want to make changes to medical advance directive? No - Patient declined  Copy of Healthcare Power of Attorney in Chart? -  Would patient like information on creating a medical advance directive? -  Pre-existing out of facility DNR order (yellow form or pink MOST form) -     Chief Complaint  Patient presents with  . Medical Management of Chronic Issues    Routine Visit of Medical Management at [facility]  . Best Practice Recommendations    Hep-C Screening, Colonosocopy, Dexa Scan  . Immunizations    T-Dap    HPI:  Pt is a 64 y.o. female seen today for medical management of chronic diseases.  She is a long-term care resident at [facility] managed by [employer].  Labs are done regularly by NP and she just had a panel on 8/7.    When seen, she was resting before her evening meal.  She reported chronic pain.  She could not specify where though chart indicates right hip and knee.  She seemed distracted by external stimuli during the visit and not enthused about talking with me.    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    Our records indicate she is overdue for colonoscopy, bone density, and tdap booster as well as hep C check, but this may not be accurate since she is managed by [facility] and their notes are in a separate location.  I attempted to look at her notes in [facility] and I cannot access anything scanned into the record (even our old notes), also cannot read the psych notes and nothing is on paper--error messages come up.    Past Medical History:  Diagnosis Date  . Anxiety    "Breakdown" in the 1990's, hosp. for >1 wk. at [facility]    . Anxiety   . Arthritis    OA- R knee, L hip   . Asthma    years ago. "no problems in a long while."  . Constipation   . Depression   . GERD (gastroesophageal reflux disease)   . Goiter    removed  at age 7  . Hypertension    all BP meds. managed by Dr. Eula Listen    . Hypertension   . Incontinence of urine    weak bladder  . Pneumonia    while at [facility].- 1990's told that she had pneumonia & was isolated   . Polyneuropathy 01/16/2020  . Stroke (HCC)    2005, R sided weakness   . Stroke [facility]) 2005   ? weakness in hand and arms per  mother. patient unsure   Past Surgical History:  Procedure Laterality Date  . ABDOMINAL HYSTERECTOMY    . COLONOSCOPY    . DILATION AND CURETTAGE OF UTERUS     1980's  . goiter     removed age 60  . JOINT REPLACEMENT  10/2011   Rt knee  . JOINT REPLACEMENT Right    knee  . THYROIDECTOMY, PARTIAL    . TONSILLECTOMY    . TOTAL HIP ARTHROPLASTY  12/23/2011   Procedure: TOTAL HIP ARTHROPLASTY;  Surgeon: Nestor Lewandowsky, MD;  Location: [facility] OR;  Service: Orthopedics;  Laterality: Left;  DEPUY/PINNACLE CUPS/SROM STEM  . TOTAL HIP ARTHROPLASTY Left   . TOTAL HIP ARTHROPLASTY Right 02/15/2015   Procedure: TOTAL HIP ARTHROPLASTY;  Surgeon: Gean Birchwood, MD;  Location: [facility] OR;  Service: Orthopedics;  Laterality: Right;  .  TOTAL KNEE ARTHROPLASTY  10/21/2011   Procedure: TOTAL KNEE ARTHROPLASTY;  Surgeon: Nestor Lewandowsky, MD;  Location: [facility] OR;  Service: Orthopedics;  Laterality: Right;  DEPUY SIGMA RP  . TUBAL LIGATION      Allergies  Allergen Reactions  . Codeine Nausea Only and Other (See Comments)    headaches  . Sulfa Antibiotics Nausea Only    Upset stomach, headaches  . Sulfa Drugs Cross Reactors Other (See Comments)    Its been a long time ago    Outpatient Encounter Medications as of 05/19/2020  Medication Sig  . acetaminophen (TYLENOL) 325 MG tablet Take 2 tablets (650 mg total) by mouth every 6 (six) hours as needed for mild pain (or Fever >/= 101).  Marland Kitchen acetaminophen (TYLENOL) 325 MG tablet Take 650 mg by mouth every 8 (eight) hours.   Marland Kitchen albuterol (VENTOLIN HFA) 108 (90 Base) MCG/ACT inhaler Inhale 2 puffs into the lungs every 4 (four) hours as needed for wheezing or shortness of breath.  Marland Kitchen amLODipine (NORVASC) 10 MG tablet Take 10 mg by mouth daily. For HTN  . aspirin EC 325 MG tablet Take 1 tablet (325 mg total) by mouth 2 (two) times daily.  . bisacodyl (DULCOLAX) 10 MG suppository Place 10 mg rectally daily as needed for moderate constipation.  . busPIRone (BUSPAR) 7.5 MG tablet Take 7.5 mg by mouth 2 (two) times daily.  . Cholecalciferol (VITAMIN D) 2000 UNITS tablet Take 2,000 Units by mouth daily.  . citalopram (CELEXA) 20 MG tablet Take 20 mg by mouth daily.  . clopidogrel (PLAVIX) 75 MG tablet Take 1 tablet (75 mg total) by mouth daily.  . Cyanocobalamin (VITAMIN B 12 PO) Take 1,000 mg by mouth daily.   Marland Kitchen doxazosin (CARDURA) 8 MG tablet Take 8 mg by mouth at bedtime.  . Flaxseed, Linseed, (FLAX SEED OIL PO) Take 1,000 mg by mouth daily.   . fluticasone furoate-vilanterol (BREO ELLIPTA) 100-25 MCG/INH AEPB Inhale 1 puff into the lungs daily. Rinse mouth with water and spit after each use  . furosemide (LASIX) 40 MG tablet Take 40 mg by mouth daily.  Marland Kitchen gabapentin (NEURONTIN) 300 MG capsule  Take 300 mg by mouth 3 (three) times daily.  Marland Kitchen losartan (COZAAR) 100 MG tablet Take 100 mg by mouth daily.  . magnesium oxide (MAG-OX) 400 MG tablet Take 400 mg by mouth daily.   . Menthol, Topical Analgesic, (BIOFREEZE EX) Apply 5 % topically 3 (three) times daily. Apply to right shoulder  . Menthol, Topical Analgesic, (BIOFREEZE EX) Apply 5 % topically 4 (four) times daily as needed. May apply to reported areas of pain  . metoprolol  tartrate (LOPRESSOR) 25 MG tablet Take 0.5 tablets (12.5 mg total) by mouth 2 (two) times daily.  . montelukast (SINGULAIR) 10 MG tablet Take 10 mg by mouth at bedtime.  . NON FORMULARY Diet: Reg, NAS Heart Healthy  . polyethylene glycol (MIRALAX / GLYCOLAX) 17 g packet Take 17 g by mouth daily.  . potassium chloride SA (KLOR-CON) 20 MEQ tablet Take 20 mEq by mouth 2 (two) times daily.  . QUEtiapine (SEROQUEL) 50 MG tablet Take 1 tablet (50 mg total) by mouth at bedtime.  . rosuvastatin (CRESTOR) 20 MG tablet Take 1 tablet (20 mg total) by mouth daily at 6 PM.  . tiZANidine (ZANAFLEX) 2 MG tablet Take 2 mg by mouth in the morning and at bedtime.  . traMADol (ULTRAM) 50 MG tablet Take 50 mg by mouth daily.  Marland Kitchen triamcinolone cream (KENALOG) 0.1 % Apply 1 application topically daily as needed (Itching).  . vitamin C (ASCORBIC ACID) 500 MG tablet Take 500 mg by mouth daily.  . [DISCONTINUED] citalopram (CELEXA) 40 MG tablet Take 40 mg by mouth daily.   . [DISCONTINUED] clonazePAM (KLONOPIN) 0.5 MG tablet Take 1 tablet (0.5 mg total) by mouth 3 (three) times daily. For Anxiety  . [DISCONTINUED] cloNIDine (CATAPRES) 0.1 MG tablet Take 0.1 mg by mouth 2 (two) times daily. Hold for SBP less than 130.  . [DISCONTINUED] omeprazole (PRILOSEC) 20 MG capsule Take 40 mg by mouth daily.   No facility-administered encounter medications on file as of 05/19/2020.    Review of Systems  Constitutional: Negative for chills, fever and malaise/fatigue.  HENT: Negative for congestion.    Eyes: Negative for blurred vision.  Respiratory: Negative for cough and shortness of breath.   Cardiovascular: Negative for chest pain, palpitations and leg swelling.  Gastrointestinal: Negative for abdominal pain, blood in stool and melena.  Genitourinary: Negative for dysuria.  Musculoskeletal: Positive for joint pain.  Skin: Negative for rash.  Neurological: Negative for dizziness and loss of consciousness.  Psychiatric/Behavioral: Positive for depression, hallucinations and memory loss. The patient has insomnia. The patient is not nervous/anxious.     Immunization History  Administered Date(s) Administered  . Influenza-Unspecified 05/31/2014, 07/01/2019  . Moderna SARS-COVID-2 Vaccination 10/28/2019, 11/25/2019  . PPD Test 03/01/2015  . Pneumococcal Conjugate-13 09/17/2019   Pertinent  Health Maintenance Due  Topic Date Due  . COLONOSCOPY  Never done  . DEXA SCAN  Never done  . INFLUENZA VACCINE  04/30/2020  . PNA vac Low Risk Adult (2 of 2 - PPSV23) 09/16/2020  . MAMMOGRAM  05/26/2021   Fall Risk  03/11/2017  Falls in the past year? Yes  Number falls in past yr: 1  Injury with Fall? No   Functional Status Survey:    Vitals:   05/19/20 1158  BP: 131/81  Pulse: 68  Resp: 18  Temp: (!) 97.1 F (36.2 C)  TempSrc: Oral  SpO2: 97%  Weight: 239 lb 12.8 oz (108.8 kg)  Height: 5\' 1"  (1.549 m)   Body mass index is 45.31 kg/m. Physical Exam Vitals reviewed.  Constitutional:      Appearance: Normal appearance.  HENT:     Head: Normocephalic and atraumatic.  Eyes:     Extraocular Movements: Extraocular movements intact.     Conjunctiva/sclera: Conjunctivae normal.     Pupils: Pupils are equal, round, and reactive to light.  Cardiovascular:     Rate and Rhythm: Normal rate and regular rhythm.     Pulses: Normal pulses.     Heart  sounds: Normal heart sounds.  Pulmonary:     Effort: Pulmonary effort is normal.     Breath sounds: Normal breath sounds. No wheezing.   Abdominal:     General: Bowel sounds are normal. There is no distension.     Palpations: Abdomen is soft.     Tenderness: There is no abdominal tenderness. There is no guarding or rebound.  Musculoskeletal:     Cervical back: Neck supple.     Right lower leg: No edema.     Left lower leg: No edema.  Skin:    General: Skin is warm and dry.  Neurological:     Mental Status: She is alert. Mental status is at baseline.     Deep Tendon Reflexes: Reflexes abnormal.  Psychiatric:     Comments: Seems to want to be left alone     Labs reviewed: Recent Labs    09/15/19 0636 09/15/19 0636 09/16/19 0249 09/16/19 0249 09/17/19 0254 09/20/19 0000 10/15/19 1345 11/11/19 0000 11/29/19 0000 11/30/19 0000 05/06/20 0000  NA 146*   < > 145   < > 143   < > 144   < > 146 147 144  K 4.0   < > 3.3*   < > 3.5   < > 3.7   < > 3.3* 3.6 3.1*  CL 107   < > 106   < > 106   < > 106   < > 106 106 103  CO2 30   < > 28   < > 29   < > 29   < > 28* 29* 29*  GLUCOSE 96   < > 113*  --  100*  --  95  --   --   --   --   BUN 13   < > 12   < > 10   < > 13   < > 13 14 13   CREATININE 0.91   < > 0.86   < > 0.79   < > 0.78   < > 0.7 0.8 0.6  CALCIUM 8.8*   < > 8.8*   < > 8.7*   < > 8.7*   < > 8.4* 8.6* 8.9  MG 1.6*  --   --   --   --   --   --   --   --   --   --    < > = values in this interval not displayed.   Recent Labs    09/14/19 1126 09/14/19 1126 10/15/19 1345 10/15/19 1345 11/11/19 0000 11/30/19 0000 05/06/20 0000  AST 17   < > 28   < > 12* 14 14  ALT 15   < > 38   < > 13 18 13   ALKPHOS 95   < > 89   < > 91 88 121  BILITOT 1.2  --  0.6  --   --   --   --   PROT 6.8  --  6.2*  --   --   --   --   ALBUMIN 3.5   < > 3.0*   < > 3.4* 3.2* 3.9   < > = values in this interval not displayed.   Recent Labs    09/14/19 1126 09/14/19 1126 09/20/19 0000 10/14/19 0000 10/15/19 1345  WBC 3.9*  --  5.2 3.9 3.6*  NEUTROABS 2.2  --  3  --  1.3*  HGB 12.6   < > 12.1 13.2  12.0  HCT 35.5*   < > 34*  38 34.2*  MCV 82.6  --   --   --  81.2  PLT 266   < > 235 312 281   < > = values in this interval not displayed.   Lab Results  Component Value Date   TSH 0.62 11/30/2019   Lab Results  Component Value Date   HGBA1C 4.8 11/30/2019   Lab Results  Component Value Date   CHOL 125 05/06/2020   HDL 64 05/06/2020   LDLCALC 45 05/06/2020   TRIG 61 05/06/2020   CHOLHDL 3.3 09/15/2019     Assessment/Plan 1. Primary osteoarthritis of right hip -chronic pain--continues on tramadol daily, biofreeze, tylenol which should not exceed 3 g in 24 h  2. Encounter for hepatitis C screening test for low risk patient -hep c screen ordered--will need to be abstracted into chart when returns to meet metric if resident agrees to test when phlebotomy comes  3. Depression, major, recurrent, severe with psychosis (HCC) -remains a concern, followed by psych, also has PTSD, anxiety -on seroquel due to psychosis component, unable to access psych notes so need to get access with administration -cont buspar, celexa also  4. History of CVA with residual deficit -here for long-term care as a result, cont assistance with adls outside of feeding self -has muscle relaxer for spasticity  5. Polyneuropathy -stable and cont gabapentin 300mg  po tid for this  6. Full code status - Full code order added to chart  7. Need for Tdap vaccination -booster ordered if resident will accept when received from pharmacy  Family/ staff Communication: discussed with snf nurse  Labs/tests ordered:  Hepatitis C screen, tdap booster   Tarick Parenteau L. Aerilyn Slee, D.O. Geriatrics [facility] [address] , [place] [address] Cell Phone (Mon-Fri 8am-5pm):  [phone] On Call:  [phone] & follow prompts after 5pm & weekends Office Phone:  [phone] Office Fax:  [phone]

## 2020-05-24 DIAGNOSIS — I6930 Unspecified sequelae of cerebral infarction: Secondary | ICD-10-CM | POA: Insufficient documentation

## 2020-05-26 ENCOUNTER — Encounter: Payer: Self-pay | Admitting: Internal Medicine

## 2020-05-26 LAB — HM HEPATITIS C SCREENING LAB: HM Hepatitis Screen: NEGATIVE

## 2020-06-23 LAB — BASIC METABOLIC PANEL
BUN: 18 (ref 4–21)
CO2: 25 — AB (ref 13–22)
Chloride: 105 (ref 99–108)
Creatinine: 0.8 (ref 0.5–1.1)
Glucose: 83
Potassium: 4.4 (ref 3.4–5.3)
Sodium: 143 (ref 137–147)

## 2020-06-23 LAB — COMPREHENSIVE METABOLIC PANEL: Calcium: 9.4 (ref 8.7–10.7)

## 2020-08-15 ENCOUNTER — Encounter: Payer: Self-pay | Admitting: Internal Medicine

## 2020-08-15 ENCOUNTER — Non-Acute Institutional Stay (SKILLED_NURSING_FACILITY): Payer: Medicare Other | Admitting: Internal Medicine

## 2020-08-15 DIAGNOSIS — G629 Polyneuropathy, unspecified: Secondary | ICD-10-CM | POA: Diagnosis not present

## 2020-08-15 DIAGNOSIS — E785 Hyperlipidemia, unspecified: Secondary | ICD-10-CM

## 2020-08-15 DIAGNOSIS — M1611 Unilateral primary osteoarthritis, right hip: Secondary | ICD-10-CM

## 2020-08-15 DIAGNOSIS — F333 Major depressive disorder, recurrent, severe with psychotic symptoms: Secondary | ICD-10-CM | POA: Diagnosis not present

## 2020-08-15 DIAGNOSIS — F411 Generalized anxiety disorder: Secondary | ICD-10-CM

## 2020-08-15 DIAGNOSIS — I69351 Hemiplegia and hemiparesis following cerebral infarction affecting right dominant side: Secondary | ICD-10-CM | POA: Diagnosis not present

## 2020-08-15 NOTE — Progress Notes (Signed)
Location:  [facility] Room Number: 201 Place of Service:  SNF [phone]) Provider:  Yehuda Printup L. Renato Gails, D.O., C.M.D.  Kermit Balo, DO  Patient Care Team: Kermit Balo, DO as PCP - General (Geriatric Medicine) Rehab, [name]  Extended Emergency Contact Information Primary Emergency Contact: Green,Margie Address: [address]          [place], [place] [address] [place] of [place] Home Phone: [phone] Mobile Phone: [phone] Relation: Mother Secondary Emergency Contact: Evern Bio [place] Home Phone: [phone] Relation: Sister  Code Status:  FULL  Goals of care: Advanced Directive information Advanced Directives 08/15/2020  Does Patient Have a Medical Advance Directive? No  Type of Advance Directive -  Does patient want to make changes to medical advance directive? -  Copy of Healthcare Power of Attorney in Chart? -  Would patient like information on creating a medical advance directive? No - Patient declined  Pre-existing out of facility DNR order (yellow form or pink MOST form) -     Chief Complaint  Patient presents with  . Medical Management of Chronic Issues    Routine Visit     HPI:  Pt is a 64 y.o. female with h/o stroke (lge vessel and basal ganglia), depression with psychosis, polyneuropathy, OA right hip, PTSD, mild cognitive impairment and GAD seen today for medical management of chronic diseases.  She is here for long-term care under [facility].  She was resting during the day just before lunch when I saw her.  She denied pain, reported sleeping well, eating well.  No shortness of breath.  Nursing reported no new concerns about her.  She spends much time in bed and uses a wheelchair outside of that.  Past Medical History:  Diagnosis Date  . Anxiety    "Breakdown" in the 1990's, hosp. for >1 wk. at [facility]    . Anxiety   . Arthritis    OA- R knee, L hip   .  Asthma    years ago. "no problems in a long while."  . Constipation   . Depression   . GERD (gastroesophageal reflux disease)   . Goiter    removed  at age 24  . Hypertension    all BP meds. managed by Dr. Eula Listen    . Hypertension   . Incontinence of urine    weak bladder  . Pneumonia    while at [facility].- [facility]'s told that she had pneumonia & was isolated   . Polyneuropathy 01/16/2020  . Stroke (HCC)    2005, R sided weakness   . Stroke [facility]) 2005   ? weakness in hand and arms per mother. patient unsure   Past Surgical History:  Procedure Laterality Date  . ABDOMINAL HYSTERECTOMY    . COLONOSCOPY    . DILATION AND CURETTAGE OF UTERUS     1980's  . goiter     removed age 67  . JOINT REPLACEMENT  10/2011   Rt knee  . JOINT REPLACEMENT Right    knee  . THYROIDECTOMY, PARTIAL    . TONSILLECTOMY    . TOTAL HIP ARTHROPLASTY  12/23/2011   Procedure: TOTAL HIP ARTHROPLASTY;  Surgeon: Nestor Lewandowsky, MD;  Location: [facility] OR;  Service: Orthopedics;  Laterality: Left;  DEPUY/PINNACLE CUPS/SROM STEM  . TOTAL HIP ARTHROPLASTY Left   . TOTAL HIP ARTHROPLASTY Right 02/15/2015   Procedure: TOTAL HIP ARTHROPLASTY;  Surgeon: Gean Birchwood, MD;  Location: [facility] OR;  Service: Orthopedics;  Laterality: Right;  . TOTAL KNEE ARTHROPLASTY  10/21/2011   Procedure: TOTAL KNEE ARTHROPLASTY;  Surgeon: Nestor Lewandowsky, MD;  Location: [facility] OR;  Service: Orthopedics;  Laterality: Right;  DEPUY SIGMA RP  . TUBAL LIGATION      Allergies  Allergen Reactions  . Codeine Nausea Only and Other (See Comments)    headaches  . Sulfa Antibiotics Nausea Only    Upset stomach, headaches  . Sulfa Drugs Cross Reactors Other (See Comments)    Its been a long time ago    Outpatient Encounter Medications as of 08/15/2020  Medication Sig  . acetaminophen (TYLENOL) 325 MG tablet Take 2 tablets (650 mg total) by mouth every 6 (six) hours as needed for mild pain (or Fever >/= 101).  Marland Kitchen acetaminophen (TYLENOL) 325 MG tablet  Take 650 mg by mouth every 8 (eight) hours.   Marland Kitchen albuterol (VENTOLIN HFA) 108 (90 Base) MCG/ACT inhaler Inhale 2 puffs into the lungs every 4 (four) hours as needed for wheezing or shortness of breath.  Marland Kitchen amLODipine (NORVASC) 10 MG tablet Take 10 mg by mouth daily. For HTN  . aspirin EC 325 MG tablet Take 1 tablet (325 mg total) by mouth 2 (two) times daily.  . bisacodyl (DULCOLAX) 10 MG suppository Place 10 mg rectally daily as needed for moderate constipation.  . busPIRone (BUSPAR) 7.5 MG tablet Take 7.5 mg by mouth 2 (two) times daily.  . Cholecalciferol (VITAMIN D) 2000 UNITS tablet Take 2,000 Units by mouth daily.  . citalopram (CELEXA) 20 MG tablet Take 20 mg by mouth daily.  . clopidogrel (PLAVIX) 75 MG tablet Take 1 tablet (75 mg total) by mouth daily.  . Cyanocobalamin (VITAMIN B 12 PO) Take 1,000 mg by mouth daily.   Marland Kitchen doxazosin (CARDURA) 8 MG tablet Take 8 mg by mouth at bedtime.  . Flaxseed, Linseed, (FLAX SEED OIL PO) Take 1,000 mg by mouth daily.   . fluticasone furoate-vilanterol (BREO ELLIPTA) 100-25 MCG/INH AEPB Inhale 1 puff into the lungs daily. Rinse mouth with water and spit after each use  . furosemide (LASIX) 40 MG tablet Take 40 mg by mouth daily.  Marland Kitchen gabapentin (NEURONTIN) 300 MG capsule Take 300 mg by mouth 3 (three) times daily.  . Loratadine (CLARITIN PO) Take 20 mg by mouth.  . losartan (COZAAR) 100 MG tablet Take 100 mg by mouth daily.  . magnesium oxide (MAG-OX) 400 MG tablet Take 400 mg by mouth daily.   . Menthol, Topical Analgesic, (BIOFREEZE EX) Apply 5 % topically 3 (three) times daily. Apply to right shoulder  . Menthol, Topical Analgesic, (BIOFREEZE EX) Apply 5 % topically 4 (four) times daily as needed. May apply to reported areas of pain  . metoprolol tartrate (LOPRESSOR) 25 MG tablet Take 0.5 tablets (12.5 mg total) by mouth 2 (two) times daily.  . montelukast (SINGULAIR) 10 MG tablet Take 10 mg by mouth at bedtime.  . NON FORMULARY Diet: Reg, NAS Heart  Healthy  . polyethylene glycol (MIRALAX / GLYCOLAX) 17 g packet Take 17 g by mouth daily.  . potassium chloride SA (KLOR-CON) 20 MEQ tablet Take 20 mEq by mouth 2 (two) times daily.  . QUEtiapine (SEROQUEL) 50 MG tablet Take 1 tablet (50 mg total) by mouth at bedtime.  . rosuvastatin (CRESTOR) 20 MG tablet Take 1 tablet (20 mg total) by mouth daily at 6 PM.  . tiZANidine (ZANAFLEX) 2 MG tablet Take 2 mg by mouth in the  morning and at bedtime.  . traMADol (ULTRAM) 50 MG tablet Take 50 mg by mouth daily.  Marland Kitchen triamcinolone cream (KENALOG) 0.1 % Apply 1 application topically daily as needed (Itching).  . vitamin C (ASCORBIC ACID) 500 MG tablet Take 500 mg by mouth daily.   No facility-administered encounter medications on file as of 08/15/2020.    Review of Systems  Constitutional: Positive for malaise/fatigue. Negative for chills and fever.  HENT: Negative for hearing loss.   Eyes: Negative for blurred vision.  Respiratory: Negative for cough and shortness of breath.   Cardiovascular: Negative for chest pain, palpitations and leg swelling.  Gastrointestinal: Negative for abdominal pain and constipation.  Genitourinary: Negative for dysuria.  Musculoskeletal: Negative for falls and joint pain.  Skin: Negative for rash.  Neurological: Negative for dizziness and loss of consciousness.  Psychiatric/Behavioral: Positive for depression, hallucinations and memory loss. The patient is nervous/anxious. The patient does not have insomnia.     Immunization History  Administered Date(s) Administered  . Influenza, High Dose Seasonal PF 07/20/2020  . Influenza-Unspecified 05/31/2014, 07/01/2019  . Moderna SARS-COVID-2 Vaccination 10/28/2019, 11/25/2019  . PPD Test 03/01/2015  . Pneumococcal Conjugate-13 09/17/2019   Pertinent  Health Maintenance Due  Topic Date Due  . COLONOSCOPY  Never done  . DEXA SCAN  Never done  . PNA vac Low Risk Adult (2 of 2 - PPSV23) 09/16/2020  . MAMMOGRAM  05/26/2021   . INFLUENZA VACCINE  Completed   Fall Risk  05/24/2020 03/11/2017  Falls in the past year? Exclusion - non ambulatory Yes  Number falls in past yr: - 1  Injury with Fall? - No  Risk for fall due to : History of fall(s);Impaired balance/gait;Impaired mobility;Medication side effect;Mental status change -  Follow up Falls evaluation completed;Education provided;Falls prevention discussed -   Functional Status Survey:    Vitals:   08/15/20 1128  BP: (!) 152/75  Pulse: 78  Temp: 98.2 F (36.8 C)  Weight: 239 lb 12.8 oz (108.8 kg)  Height: 5\' 1"  (1.549 m)   Body mass index is 45.31 kg/m. Physical Exam Vitals reviewed.  Constitutional:      Appearance: Normal appearance. She is obese. She is not toxic-appearing.  HENT:     Head: Normocephalic and atraumatic.     Right Ear: External ear normal.     Left Ear: External ear normal.  Eyes:     Conjunctiva/sclera: Conjunctivae normal.     Pupils: Pupils are equal, round, and reactive to light.  Cardiovascular:     Rate and Rhythm: Normal rate and regular rhythm.     Pulses: Normal pulses.     Heart sounds: Normal heart sounds.  Pulmonary:     Effort: Pulmonary effort is normal.     Breath sounds: Normal breath sounds. No wheezing.  Abdominal:     General: Bowel sounds are normal.     Palpations: Abdomen is soft.     Tenderness: There is no abdominal tenderness. There is no guarding or rebound.  Musculoskeletal:     Cervical back: Neck supple.     Right lower leg: No edema.     Left lower leg: No edema.  Skin:    General: Skin is warm and dry.  Neurological:     Mental Status: She is alert. Mental status is at baseline.     Sensory: Sensory deficit present.     Motor: Weakness present.     Gait: Gait abnormal.  Psychiatric:  Mood and Affect: Mood normal.     Labs reviewed: Recent Labs    09/15/19 0636 09/15/19 0636 09/16/19 0249 09/16/19 0249 09/17/19 0254 09/20/19 0000 10/15/19 1345 11/11/19 0000  11/30/19 0000 05/06/20 0000 06/23/20 0000  NA 146*   < > 145   < > 143   < > 144   < > 147 144 143  K 4.0   < > 3.3*   < > 3.5   < > 3.7   < > 3.6 3.1* 4.4  CL 107   < > 106   < > 106   < > 106   < > 106 103 105  CO2 30   < > 28   < > 29   < > 29   < > 29* 29* 25*  GLUCOSE 96   < > 113*  --  100*  --  95  --   --   --   --   BUN 13   < > 12   < > 10   < > 13   < > 14 13 18   CREATININE 0.91   < > 0.86   < > 0.79   < > 0.78   < > 0.8 0.6 0.8  CALCIUM 8.8*   < > 8.8*   < > 8.7*   < > 8.7*   < > 8.6* 8.9 9.4  MG 1.6*  --   --   --   --   --   --   --   --   --   --    < > = values in this interval not displayed.   Recent Labs    09/14/19 1126 09/14/19 1126 10/15/19 1345 10/15/19 1345 11/11/19 0000 11/30/19 0000 05/06/20 0000  AST 17   < > 28   < > 12* 14 14  ALT 15   < > 38   < > 13 18 13   ALKPHOS 95   < > 89   < > 91 88 121  BILITOT 1.2  --  0.6  --   --   --   --   PROT 6.8  --  6.2*  --   --   --   --   ALBUMIN 3.5   < > 3.0*   < > 3.4* 3.2* 3.9   < > = values in this interval not displayed.   Recent Labs    09/14/19 1126 09/14/19 1126 09/20/19 0000 09/20/19 0000 10/14/19 0000 10/15/19 1345 05/06/20 0000  WBC 3.9*  --  5.2   < > 3.9 3.6* 3.7  NEUTROABS 2.2  --  3  --   --  1.3*  --   HGB 12.6   < > 12.1   < > 13.2 12.0 12.7  HCT 35.5*   < > 34*   < > 38 34.2* 36  MCV 82.6  --   --   --   --  81.2  --   PLT 266   < > 235   < > 312 281 216   < > = values in this interval not displayed.   Lab Results  Component Value Date   TSH 0.62 11/30/2019   Lab Results  Component Value Date   HGBA1C 4.8 11/30/2019   Lab Results  Component Value Date   CHOL 125 05/06/2020   HDL 64 05/06/2020   LDLCALC 45 05/06/2020   TRIG 61 05/06/2020   CHOLHDL  3.3 09/15/2019    Assessment/Plan 1. Hemiparesis affecting right side as late effect of stroke (HCC) -continue SNF care for ADLs  2. Primary osteoarthritis of right hip -not c/o pain today, has scheduled and prn tylenol  not to exceed 3g per day and single dose of tramadol daily--would try not to add additional opioids due to already also being on muscle relaxers--seems to be sleepy a lot -also has biofreeze  3. Depression, major, recurrent, severe with psychosis (HCC) -on extensive regimen due to this, anxiety and PTSD -cont seroquel at hs, buspar, celexa  4. Polyneuropathy -continues on gabapentin--monitor carefully not to overdo as ages and renal function declines  5. Hyperlipidemia, unspecified hyperlipidemia type -lipids at goal, continues on crestor therapy  6. GAD (generalized anxiety disorder) -cont buspar for this and her PTSD, also on celexa for the depression component with seroquel for the hallucinations and delusions that affect her qol  Family/ staff Communication: d/w snf nurse  Labs/tests ordered:  No new added today  Bailey [name] L. Lashina Milles, D.O. Geriatrics [facility] [address], [place] [address] Cell Phone (Mon-Fri 8am-5pm):  [phone] On Call:  [phone] & follow prompts after 5pm & weekends Office Phone:  [phone] Office Fax:  [phone]

## 2020-11-20 ENCOUNTER — Encounter: Payer: Self-pay | Admitting: Internal Medicine

## 2021-07-07 IMAGING — CT CT ANGIO NECK
3 of 7 series · 10 of 36 positions shown · IV contrast (OMNI 350)
Comparison: Prior MRI from earlier the same day.

CLINICAL DATA: Follow-up examination for acute stroke.



[Series 6: cta neck · axial · 0.46mm/px · z∈[-132,-26]mm · 2 of 160 slices shown]
[im 54/160  soft-tissue]
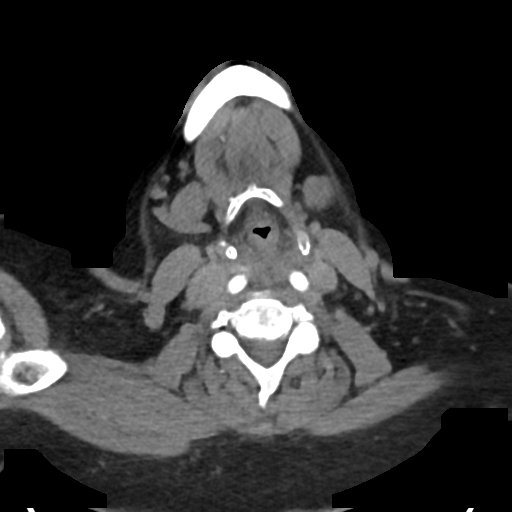
[im 107/160  soft-tissue]
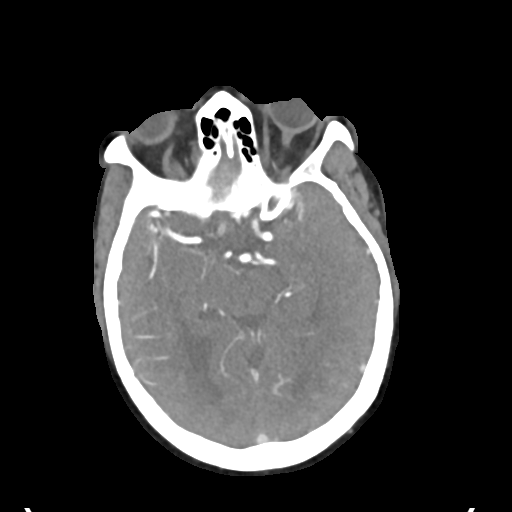

[Series 8: cta neck axial · axial · 0.39mm/px · z∈[-202,+24]mm · 6 of 319 slices shown]
[im 46/319  soft-tissue]
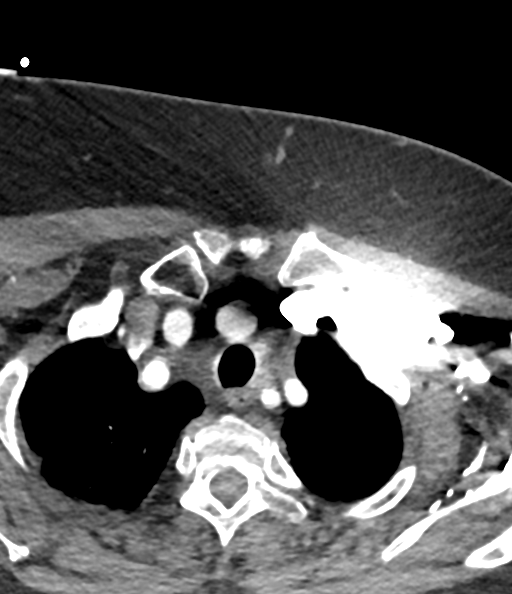
[im 91/319  bone]
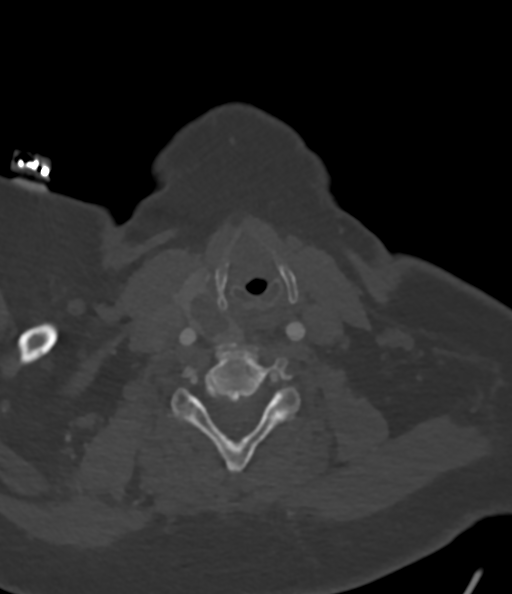
[im 137/319  soft-tissue]
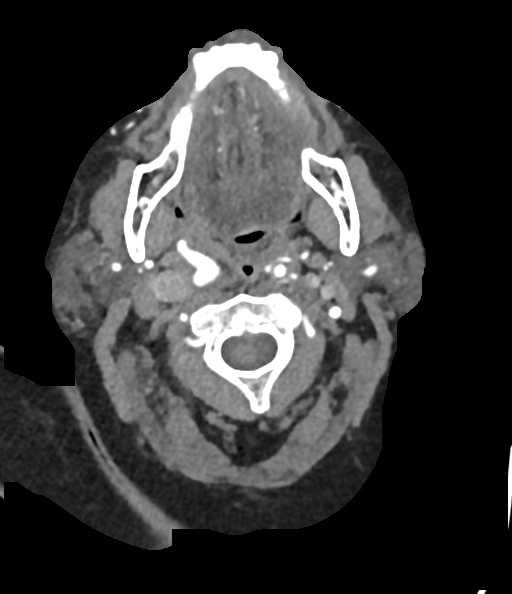
[im 182/319  bone]
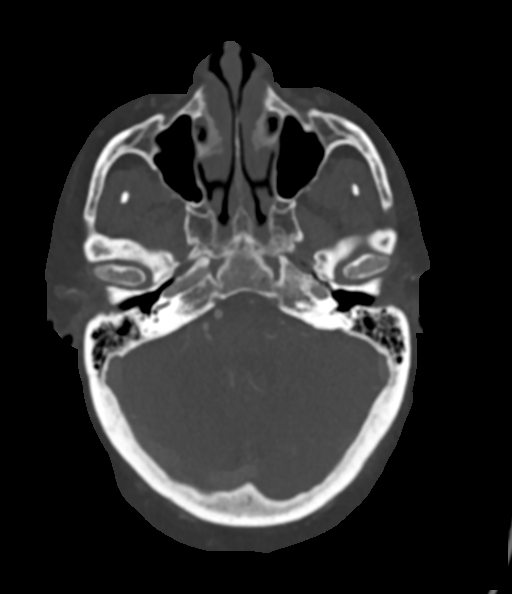
[im 228/319  soft-tissue]
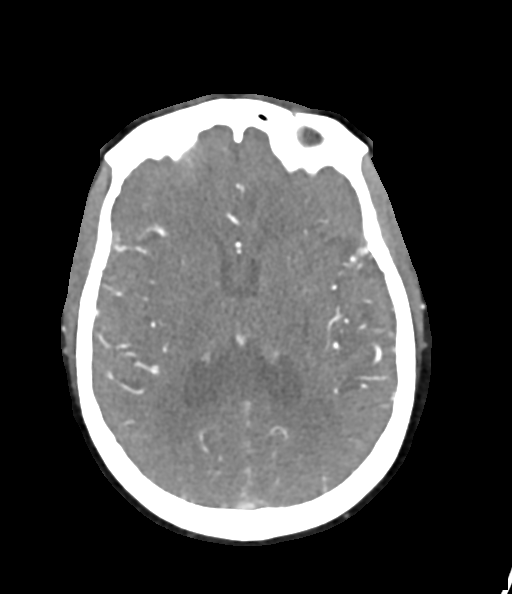
[im 273/319  bone]
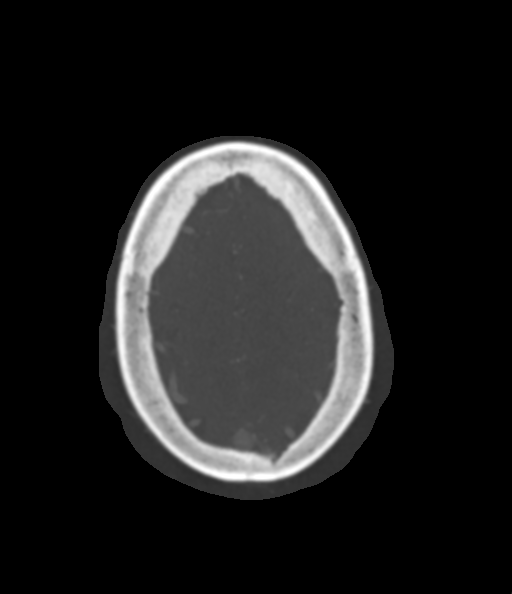

[Series 10: cta neck sagittal · sagittal · 0.47mm/px · 2 of 185 slices shown]
[im 34/185  soft-tissue]
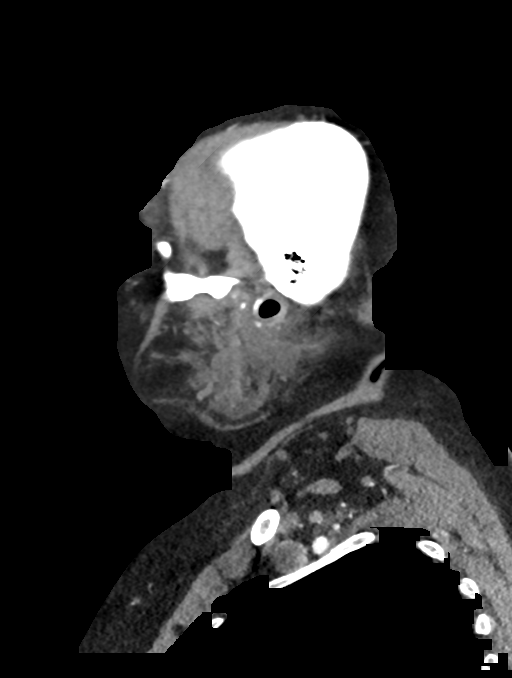
[im 152/185  soft-tissue]
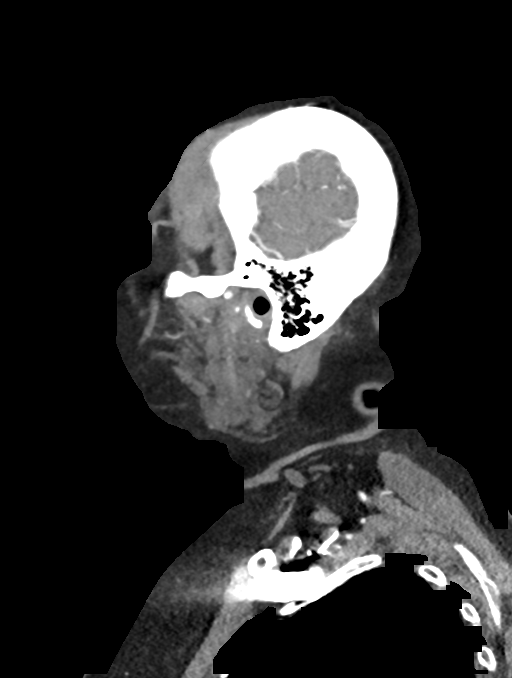

[10 of 36 positions shown; findings below may reference images not displayed]

FINDINGS: CTA NECK FINDINGS

Aortic arch: Visualized aortic arch of normal caliber with normal
branch pattern. No hemodynamically significant stenosis seen about
the origin of the great vessels. Visualized subclavian arteries
widely patent.

Right carotid system: Right common and internal carotid arteries are
diffusely tortuous but widely patent to the skull base without
stenosis, dissection or occlusion. Right carotid artery system
medialized into the retropharyngeal space.

Left carotid system: Left common and internal carotid arteries are
diffusely tortuous but widely patent to the skull base without
stenosis, dissection or occlusion. Left carotid artery system
medialized into the retropharyngeal space.

Vertebral arteries: Both vertebral arteries arise from the
subclavian arteries. Vertebral arteries mildly tortuous but widely
patent within the neck without stenosis, dissection or occlusion.

Skeleton: No acute osseous finding. No discrete lytic or blastic
osseous lesions.

Other neck: No other acute soft tissue abnormality within the neck.
Enlarged multinodular right lobe of thyroid is seen. Dominant 19 mm
hypodense right thyroid not (series 6, image 116). Left thyroid lobe
hypoplastic and/or absent.

Upper chest: Visualized upper chest demonstrates no acute finding.
Partially visualized lungs are largely clear.

Review of the MIP images confirms the above findings

CTA HEAD FINDINGS

Anterior circulation: Both petrous segments widely patent. Mild
scattered atheromatous plaque within the cavernous/supraclinoid ICAs
without significant stenosis. A1 segments widely patent. Normal
anterior communicating artery. Anterior cerebral arteries widely
patent to their distal aspects without stenosis. M1 segments widely
patent bilaterally. Normal MCA bifurcations. Distal MCA branches
well perfused and symmetric.

Posterior circulation: Vertebral arteries mildly irregular but
patent to the vertebrobasilar junction without flow-limiting
stenosis. Patent right PICA. Left PICA not seen. Basilar widely
patent to its distal aspect. Basilar tip somewhat ectatic without
discrete aneurysm. Superior cerebral arteries patent bilaterally.
Right PCA supplied via the basilar as well as a small right
posterior communicating artery. Fetal type origin of the left PCA.
PCAs mildly irregular but are well perfused to their distal aspects
without significant stenosis.

Venous sinuses: Grossly patent allowing for timing the contrast
bolus.

Anatomic variants: Fetal type origin left PCA. No intracranial
aneurysm or other vascular abnormality.

Review of the MIP images confirms the above findings
IMPRESSION: 1. Negative CTA for large vessel occlusion. No high-grade or
correctable stenosis identified.
2. Diffuse tortuosity of the major arterial vasculature of the head
and neck, suggesting chronic underlying hypertension.
3. Enlarged multinodular right lobe of thyroid with dominant 19 mm
right thyroid nodule. Further evaluation with dedicated thyroid
ultrasound recommended. This could be performed on a nonemergent
basis.(ref: [HOSPITAL]. [DATE]): 143-50).

## 2021-12-19 ENCOUNTER — Other Ambulatory Visit: Payer: Self-pay | Admitting: Specialist

## 2021-12-24 ENCOUNTER — Other Ambulatory Visit (HOSPITAL_COMMUNITY): Payer: Self-pay | Admitting: Adult Medicine

## 2021-12-24 ENCOUNTER — Other Ambulatory Visit (HOSPITAL_BASED_OUTPATIENT_CLINIC_OR_DEPARTMENT_OTHER): Payer: Self-pay | Admitting: Adult Medicine

## 2021-12-24 DIAGNOSIS — R222 Localized swelling, mass and lump, trunk: Secondary | ICD-10-CM

## 2021-12-31 ENCOUNTER — Ambulatory Visit (HOSPITAL_COMMUNITY)
Admission: RE | Admit: 2021-12-31 | Discharge: 2021-12-31 | Disposition: A | Discharge status: Home / Self Care | Payer: Medicare Other | Source: Ambulatory Visit | Attending: Adult Medicine | Admitting: Adult Medicine

## 2021-12-31 DIAGNOSIS — R222 Localized swelling, mass and lump, trunk: Secondary | ICD-10-CM | POA: Insufficient documentation

## 2023-12-26 ENCOUNTER — Other Ambulatory Visit (HOSPITAL_COMMUNITY): Payer: Self-pay | Admitting: Internal Medicine

## 2023-12-26 DIAGNOSIS — I517 Cardiomegaly: Secondary | ICD-10-CM

## 2024-01-22 ENCOUNTER — Ambulatory Visit (HOSPITAL_COMMUNITY)
Admission: RE | Admit: 2024-01-22 | Discharge: 2024-01-22 | Disposition: A | Discharge status: Home / Self Care | Source: Ambulatory Visit | Attending: Internal Medicine | Admitting: Internal Medicine

## 2024-01-22 DIAGNOSIS — I517 Cardiomegaly: Secondary | ICD-10-CM

## 2024-01-22 LAB — ECHOCARDIOGRAM COMPLETE
AR max vel: 3.16 cm2
AV Area VTI: 3.37 cm2
AV Area mean vel: 3.22 cm2
AV Mean grad: 2 mmHg
AV Peak grad: 5.2 mmHg
Ao pk vel: 1.14 m/s
Area-P 1/2: 2.55 cm2
Calc EF: 71.3 %
S' Lateral: 1.9 cm
Single Plane A2C EF: 77.6 %
Single Plane A4C EF: 64.7 %

## 2024-01-22 NOTE — Progress Notes (Signed)
*  PRELIMINARY RESULTS* Echocardiogram 2D Echocardiogram has been performed.  Bailey [name] 01/22/2024, 3:57 PM
